# Patient Record
Sex: Male | Born: 1990 | Race: White | Hispanic: No | Marital: Single | Smoking: Current every day smoker
Health system: Southern US, Community
[De-identification: ages and names within clinical notes are randomized; demographics above are authoritative.]

## PROBLEM LIST (undated history)

## (undated) DIAGNOSIS — F909 Attention-deficit hyperactivity disorder, unspecified type: Secondary | ICD-10-CM

## (undated) DIAGNOSIS — F329 Major depressive disorder, single episode, unspecified: Secondary | ICD-10-CM

## (undated) DIAGNOSIS — I10 Essential (primary) hypertension: Secondary | ICD-10-CM

## (undated) DIAGNOSIS — F411 Generalized anxiety disorder: Secondary | ICD-10-CM

## (undated) HISTORY — PX: COLONOSCOPY: SHX174

## (undated) HISTORY — DX: Major depressive disorder, single episode, unspecified: F32.9

## (undated) HISTORY — PX: WISDOM TOOTH EXTRACTION: SHX21

## (undated) HISTORY — DX: Attention-deficit hyperactivity disorder, unspecified type: F90.9

## (undated) HISTORY — DX: Generalized anxiety disorder: F41.1

---

## 2013-07-04 ENCOUNTER — Ambulatory Visit (INDEPENDENT_AMBULATORY_CARE_PROVIDER_SITE_OTHER): Payer: No Typology Code available for payment source | Admitting: Family Medicine

## 2013-07-04 ENCOUNTER — Encounter (INDEPENDENT_AMBULATORY_CARE_PROVIDER_SITE_OTHER): Payer: Self-pay

## 2013-07-04 ENCOUNTER — Encounter: Payer: Self-pay | Admitting: Family Medicine

## 2013-07-04 ENCOUNTER — Telehealth: Payer: Self-pay | Admitting: Family Medicine

## 2013-07-04 VITALS — BP 129/82 | HR 82 | Temp 98.2°F | Ht 72.0 in | Wt 172.2 lb

## 2013-07-04 DIAGNOSIS — J209 Acute bronchitis, unspecified: Secondary | ICD-10-CM

## 2013-07-04 MED ORDER — AZITHROMYCIN 250 MG PO TABS: ORAL_TABLET | ORAL | Status: DC

## 2013-07-04 MED ORDER — METHYLPREDNISOLONE (PAK) 4 MG PO TABS: ORAL_TABLET | ORAL | Status: DC

## 2013-07-04 MED ORDER — BENZONATATE 200 MG PO CAPS: 200.0000 mg | ORAL_CAPSULE | Freq: Two times a day (BID) | ORAL | Status: DC | PRN

## 2013-07-04 NOTE — Progress Notes (Signed)
  Subjective:    Patient ID: Marcus Martinez, male    DOB: 12-29-1990, 22 y.o.   MRN: [id number]  HPI This 22 y.o. male presents for evaluation of URI sx's for over a week..   Review of Systems C/o URI sx's. No chest pain, SOB, HA, dizziness, vision change, N/V, diarrhea, constipation, dysuria, urinary urgency or frequency, myalgias, arthralgias or rash.     Objective:   Physical Exam Vital signs noted  Well developed well nourished male.  HEENT - Head atraumatic Normocephalic                Eyes - PERRLA, Conjuctiva - clear Sclera- Clear EOMI                Ears - EAC's Wnl TM's Wnl Gross Hearing WNL                Nose - Nares patent                 Throat - oropharanx wnl Respiratory - Lungs CTA bilateral Cardiac - RRR S1 and S2 without murmur GI - Abdomen soft Nontender and bowel sounds active x 4 Extremities - No edema. Neuro - Grossly intact.       Assessment & Plan:  Acute bronchitis - Plan: azithromycin (ZITHROMAX) 250 MG tablet, methylPREDNIsolone (MEDROL DOSPACK) 4 MG tablet, benzonatate (TESSALON) 200 MG capsule Deatra Canter FNP

## 2013-07-04 NOTE — Telephone Encounter (Signed)
Pt needs to be seen for sinus infection appt scheduled

## 2013-07-04 NOTE — Patient Instructions (Signed)

## 2013-07-29 ENCOUNTER — Encounter: Payer: Self-pay | Admitting: General Practice

## 2013-07-29 ENCOUNTER — Ambulatory Visit (INDEPENDENT_AMBULATORY_CARE_PROVIDER_SITE_OTHER): Payer: No Typology Code available for payment source | Admitting: General Practice

## 2013-07-29 VITALS — BP 145/83 | HR 103 | Temp 101.1°F | Ht 72.0 in | Wt 175.5 lb

## 2013-07-29 DIAGNOSIS — J029 Acute pharyngitis, unspecified: Secondary | ICD-10-CM

## 2013-07-29 LAB — POCT INFLUENZA A/B
Influenza A, POC: NEGATIVE
Influenza B, POC: NEGATIVE

## 2013-07-29 LAB — POCT RAPID STREP A (OFFICE): Rapid Strep A Screen: POSITIVE — AB

## 2013-07-29 MED ORDER — AZITHROMYCIN 250 MG PO TABS: ORAL_TABLET | ORAL | Status: DC

## 2013-07-29 NOTE — Patient Instructions (Signed)
Strep Throat  Strep throat is an infection of the throat caused by a bacteria named Streptococcus pyogenes. Your caregiver may call the infection streptococcal "tonsillitis" or "pharyngitis" depending on whether there are signs of inflammation in the tonsils or back of the throat. Strep throat is most common in children aged 22 15 years during the cold months of the year, but it can occur in people of any age during any season. This infection is spread from person to person (contagious) through coughing, sneezing, or other close contact.  SYMPTOMS   · Fever or chills.  · Painful, swollen, red tonsils or throat.  · Pain or difficulty when swallowing.  · White or yellow spots on the tonsils or throat.  · Swollen, tender lymph nodes or "glands" of the neck or under the jaw.  · Red rash all over the body (rare).  DIAGNOSIS   Many different infections can cause the same symptoms. A test must be done to confirm the diagnosis so the right treatment can be given. A "rapid strep test" can help your caregiver make the diagnosis in a few minutes. If this test is not available, a light swab of the infected area can be used for a throat culture test. If a throat culture test is done, results are usually available in a day or two.  TREATMENT   Strep throat is treated with antibiotic medicine.  HOME CARE INSTRUCTIONS   · Gargle with 1 tsp of salt in 1 cup of warm water, 3 4 times per day or as needed for comfort.  · Family members who also have a sore throat or fever should be tested for strep throat and treated with antibiotics if they have the strep infection.  · Make sure everyone in your household washes their hands well.  · Do not share food, drinking cups, or personal items that could cause the infection to spread to others.  · You may need to eat a soft food diet until your sore throat gets better.  · Drink enough water and fluids to keep your urine clear or pale yellow. This will help prevent dehydration.  · Get plenty of  rest.  · Stay home from school, daycare, or work until you have been on antibiotics for 24 hours.  · Only take over-the-counter or prescription medicines for pain, discomfort, or fever as directed by your caregiver.  · If antibiotics are prescribed, take them as directed. Finish them even if you start to feel better.  SEEK MEDICAL CARE IF:   · The glands in your neck continue to enlarge.  · You develop a rash, cough, or earache.  · You cough up green, yellow-brown, or bloody sputum.  · You have pain or discomfort not controlled by medicines.  · Your problems seem to be getting worse rather than better.  SEEK IMMEDIATE MEDICAL CARE IF:   · You develop any new symptoms such as vomiting, severe headache, stiff or painful neck, chest pain, shortness of breath, or trouble swallowing.  · You develop severe throat pain, drooling, or changes in your voice.  · You develop swelling of the neck, or the skin on the neck becomes red and tender.  · You have a fever.  · You develop signs of dehydration, such as fatigue, dry mouth, and decreased urination.  · You become increasingly sleepy, or you cannot wake up completely.  Document Released: 08/18/2000 Document Revised: 08/07/2012 Document Reviewed: 10/20/2010  ExitCare® Patient Information ©2014 [employer].

## 2013-07-29 NOTE — Progress Notes (Signed)
  Subjective:    Patient ID: Marcus Martinez, male    DOB: 1991/07/12, 22 y.o.   MRN: [id number]  Sore Throat  This is a new problem. The current episode started in the past 7 days. The problem has been gradually worsening. Neither side of throat is experiencing more pain than the other. The maximum temperature recorded prior to his arrival was 101 - 101.9 F. The pain is at a severity of 8/10. Pertinent negatives include no congestion, coughing, diarrhea or shortness of breath.      Review of Systems  HENT: Negative for congestion.   Respiratory: Negative for cough and shortness of breath.   Gastrointestinal: Negative for diarrhea.       Objective:   Physical Exam     Results for orders placed in visit on 07/29/13  POCT RAPID STREP A (OFFICE)      Result Value Range   Rapid Strep A Screen Positive (*) Negative       Assessment & Plan:

## 2013-07-30 ENCOUNTER — Telehealth: Payer: Self-pay | Admitting: General Practice

## 2013-07-30 NOTE — Telephone Encounter (Signed)
Mae this is your patient 

## 2013-07-30 NOTE — Telephone Encounter (Signed)
Please inform that script is ready for pick up.  

## 2013-07-30 NOTE — Telephone Encounter (Signed)
Patient aware.

## 2014-01-27 ENCOUNTER — Ambulatory Visit (INDEPENDENT_AMBULATORY_CARE_PROVIDER_SITE_OTHER): Payer: No Typology Code available for payment source | Admitting: *Deleted

## 2014-01-27 DIAGNOSIS — Z5189 Encounter for other specified aftercare: Secondary | ICD-10-CM

## 2014-01-27 NOTE — Progress Notes (Addendum)
Patient comes in today through triage as a hospital follow up. He was seen at [facility] in [place], [place] 2 days ago for a laceration to dorsal right foot and contusion below right eye. Patient was making a peanut butter sandwich during the night and dropped the knife on his foot causing the laceration. He then slipped in the blood and hit his head on the kitchen counter resulting in the the contusion. No LOC. He was discharged from the ER with Clindamycin which he started yesterday. He has had 3 doses. The laceration was secured with Dermabond and covered with a bandaid.  Today there is erythema and mild swelling surrounding the laceration.  No warmth or drainage noted. He does complain of some pain.  Contusion under eye appears to be progressing normally.  Continue antibiotic as directed at ER. Follow up appt scheduled for 2 days. Marked area of redness with permanent marker.  Call our office if erythema extends beyond marked area or if there is an increase in pain, swelling, or drainage.  Keep area clean, dry, and covered. Pat dry. Ibuprofen for pain. May alternate with Tylenol if uncontrolled.  Patient stated understanding and agreement to plan.  Work note given to cover until f/u appointment since patient stands on his feet 10 hours a day.

## 2014-01-27 NOTE — Patient Instructions (Addendum)
Keep area clean, dry, and covered. Pat dry. If redness spreads outside of marked area call our office. If increase in pain, redness, or drainage occurs please call our office.  Continue antibiotic as instructed at ER.  Ibuprofen for pain. May alternate with Tylenol if necessary.  Keep f/u appt on 01/29/14.

## 2014-01-29 ENCOUNTER — Encounter: Payer: Self-pay | Admitting: *Deleted

## 2014-01-29 ENCOUNTER — Encounter: Payer: Self-pay | Admitting: Family Medicine

## 2014-01-29 ENCOUNTER — Ambulatory Visit (INDEPENDENT_AMBULATORY_CARE_PROVIDER_SITE_OTHER): Payer: BC Managed Care – PPO | Admitting: Family Medicine

## 2014-01-29 VITALS — BP 124/80 | HR 81 | Temp 97.8°F | Ht 72.0 in | Wt 170.0 lb

## 2014-01-29 DIAGNOSIS — S91339A Puncture wound without foreign body, unspecified foot, initial encounter: Secondary | ICD-10-CM

## 2014-01-29 DIAGNOSIS — S0510XA Contusion of eyeball and orbital tissues, unspecified eye, initial encounter: Secondary | ICD-10-CM

## 2014-01-29 DIAGNOSIS — S0511XA Contusion of eyeball and orbital tissues, right eye, initial encounter: Secondary | ICD-10-CM

## 2014-01-29 DIAGNOSIS — S91309A Unspecified open wound, unspecified foot, initial encounter: Secondary | ICD-10-CM

## 2014-01-29 NOTE — Progress Notes (Signed)
   Subjective:    Patient ID: Marcus Martinez, male    DOB: 06-Nov-1990, 23 y.o.   MRN: [id number]  HPI Patient here today for follow up on right foot wound. He dropped a knife on his foot early Monday morning while fixing a sandwich, he then slipped on the blood and fell and hit right eye. This eye now has some bruising. The patient is alert and doing well except for the soreness in his dorsal right foot. There was minimal drainage from the foot wound        There are no active problems to display for this patient.  Outpatient Encounter Prescriptions as of 01/29/2014  Medication Sig  . CLINDAMYCIN HCL PO Take 1 capsule by mouth 3 (three) times daily.    Review of Systems  Constitutional: Negative.   HENT: Negative.   Eyes: Negative.   Respiratory: Negative.   Cardiovascular: Negative.   Gastrointestinal: Negative.   Endocrine: Negative.   Genitourinary: Negative.   Musculoskeletal: Negative.   Skin: Positive for color change (bruising under right eye). Wound: right foot.  Allergic/Immunologic: Negative.   Neurological: Negative.   Hematological: Negative.   Psychiatric/Behavioral: Negative.        Objective:   Physical Exam  Vitals reviewed. Constitutional: He is oriented to person, place, and time. He appears well-developed and well-nourished. He appears distressed.  HENT:  Head: Normocephalic.  Contusion right superior orbit with bruising below right eye  Eyes: Conjunctivae and EOM are normal. Pupils are equal, round, and reactive to light. Right eye exhibits no discharge. Left eye exhibits no discharge. No scleral icterus.  The disc were sharp bilaterally with good EOM  Neck: Normal range of motion.  Musculoskeletal: He exhibits tenderness. He exhibits no edema.  Slight soreness and dorsal foot with flexion and extension. The erythema appears to be regressing. There was still slight rubor present. There was tenderness in the dorsal foot appear  Neurological: He is  alert and oriented to person, place, and time. He has normal reflexes.  Skin: Skin is warm. No rash noted. There is erythema.  Psychiatric: He has a normal mood and affect. His behavior is normal. Judgment and thought content normal.   BP 124/80  Pulse 81  Temp(Src) 97.8 F (36.6 C) (Oral)  Ht 6' (1.829 m)  Wt 170 lb (77.111 kg)  BMI 23.05 kg/m2        Assessment & Plan:  1. Penetrating foot wound  2. Contusion of right orbit Patient Instructions  Clean area with betadine and gauze Follow up Monday Stay out of work until after we see you on Monday Keep foot elevated when possible Continue and complete antibiotic   Nyra Capes MD

## 2014-01-29 NOTE — Patient Instructions (Addendum)
Clean area with betadine and gauze Follow up Monday Stay out of work until after we see you on Monday Keep foot elevated when possible Continue and complete antibiotic

## 2014-02-02 ENCOUNTER — Encounter: Payer: Self-pay | Admitting: *Deleted

## 2014-02-02 ENCOUNTER — Ambulatory Visit (INDEPENDENT_AMBULATORY_CARE_PROVIDER_SITE_OTHER): Payer: BC Managed Care – PPO | Admitting: Family Medicine

## 2014-02-02 ENCOUNTER — Encounter: Payer: Self-pay | Admitting: Family Medicine

## 2014-02-02 VITALS — BP 137/79 | HR 85 | Temp 97.4°F | Ht 72.0 in | Wt 174.0 lb

## 2014-02-02 DIAGNOSIS — S91309A Unspecified open wound, unspecified foot, initial encounter: Secondary | ICD-10-CM

## 2014-02-02 DIAGNOSIS — S91339A Puncture wound without foreign body, unspecified foot, initial encounter: Secondary | ICD-10-CM

## 2014-02-02 MED ORDER — CLINDAMYCIN HCL 300 MG PO CAPS: 300.0000 mg | ORAL_CAPSULE | Freq: Three times a day (TID) | ORAL | Status: DC

## 2014-02-02 NOTE — Progress Notes (Signed)
   Subjective:    Patient ID: Marcus Martinez, male    DOB: [birth year], 23 y.o.   MRN: [id number]  HPI Patient here today for 3 day follow up for wound to right foot. The patient comes in today with his mother. The wound does have some drainage. He has 3 more days worth of antibiotics. There is less redness.        There are no active problems to display for this patient.  Outpatient Encounter Prescriptions as of 02/02/2014  Medication Sig  . CLINDAMYCIN HCL PO Take 1 capsule by mouth 3 (three) times daily.    Review of Systems  Constitutional: Negative.   HENT: Negative.   Eyes: Negative.   Respiratory: Negative.   Cardiovascular: Negative.   Gastrointestinal: Negative.   Endocrine: Negative.   Genitourinary: Negative.   Musculoskeletal: Negative.   Skin: Positive for wound (cleaning it daily and now able to put some pressure on right foot).  Allergic/Immunologic: Negative.   Neurological: Negative.   Hematological: Negative.   Psychiatric/Behavioral: Negative.        Objective:   Physical Exam BP 137/79  Pulse 85  Temp(Src) 97.4 F (36.3 C) (Oral)  Ht 6' (1.829 m)  Wt 174 lb (78.926 kg)  BMI 23.59 kg/m2  The patient is alert and cooperative. There is much less swelling and redness. There is still some drainage. The wound was originally glued together.      Assessment & Plan:  1. Penetrating foot wound - clindamycin (CLEOCIN) 300 MG capsule; Take 1 capsule (300 mg total) by mouth 3 (three) times daily.  Dispense: 12 capsule; Refill: 0 Patient Instructions  Continue the wound cleansing with Betadine Continue to bear more weight at home Continue the antibiotic until rechecked in one week   Remain out of work until rechecked in one week  Nyra Capes MD

## 2014-02-02 NOTE — Patient Instructions (Signed)
Continue the wound cleansing with Betadine Continue to bear more weight at home Continue the antibiotic until rechecked in one week

## 2014-02-09 ENCOUNTER — Encounter: Payer: Self-pay | Admitting: Family Medicine

## 2014-02-09 ENCOUNTER — Ambulatory Visit (INDEPENDENT_AMBULATORY_CARE_PROVIDER_SITE_OTHER): Payer: BC Managed Care – PPO | Admitting: Family Medicine

## 2014-02-09 VITALS — BP 142/83 | HR 95 | Temp 97.6°F | Ht 72.0 in | Wt 176.0 lb

## 2014-02-09 DIAGNOSIS — S91339A Puncture wound without foreign body, unspecified foot, initial encounter: Secondary | ICD-10-CM

## 2014-02-09 DIAGNOSIS — S91309A Unspecified open wound, unspecified foot, initial encounter: Secondary | ICD-10-CM

## 2014-02-09 DIAGNOSIS — Z9889 Other specified postprocedural states: Secondary | ICD-10-CM

## 2014-02-09 MED ORDER — CLINDAMYCIN HCL 300 MG PO CAPS: 300.0000 mg | ORAL_CAPSULE | Freq: Three times a day (TID) | ORAL | Status: DC

## 2014-02-09 MED ORDER — SODIUM CHLORIDE 0.9 % EX SOLN: CUTANEOUS | Status: DC

## 2014-02-09 NOTE — Progress Notes (Addendum)
Subjective:    Patient ID: Marcus Martinez, male    DOB: Dec 26, 1990, 23 y.o.   MRN: [id number]  HPI Patient here today for 1 week follow up from penetrating foot wound. There may be a slight increase in redness in the wound. The patient has not been back to work.         There are no active problems to display for this patient.  Outpatient Encounter Prescriptions as of 02/09/2014  Medication Sig  . clindamycin (CLEOCIN) 300 MG capsule Take 1 capsule (300 mg total) by mouth 3 (three) times daily.  . [DISCONTINUED] CLINDAMYCIN HCL PO Take 1 capsule by mouth 3 (three) times daily.    Review of Systems  Constitutional: Negative.   HENT: Negative.   Eyes: Negative.   Respiratory: Negative.   Cardiovascular: Negative.   Gastrointestinal: Negative.   Endocrine: Negative.   Genitourinary: Negative.   Musculoskeletal: Negative.   Skin: Positive for color change (redness now) and wound.  Allergic/Immunologic: Negative.   Neurological: Negative.   Hematological: Negative.   Psychiatric/Behavioral: Negative.        Objective:   Physical Exam  Constitutional: He is oriented to person, place, and time. He appears well-developed and well-nourished.  HENT:  Head: Normocephalic.  Eyes: Conjunctivae and EOM are normal. Pupils are equal, round, and reactive to light. Right eye exhibits no discharge. Left eye exhibits no discharge. No scleral icterus.  Musculoskeletal: Normal range of motion. He exhibits no edema and no tenderness.  Neurological: He is alert and oriented to person, place, and time.  Skin: Skin is warm. No rash noted. There is erythema.  Psychiatric: He has a normal mood and affect. His behavior is normal. Judgment and thought content normal.   BP 142/83  Pulse 95  Temp(Src) 97.6 F (36.4 C) (Oral)  Ht 6' (1.829 m)  Wt 176 lb (79.833 kg)  BMI 23.86 kg/m2  There is still erythema on the dorsal right foot and a glue apparently is not holding the wound together and  there is drainage from beneath the glue that was applied. The wound was abbreviated today and was cleansed after debriding. The patient tolerated the debridement without problems. After debriding some of the dead tissue the wound was cleansed thoroughly with Betadine and a saline dressing was applied. The patient tolerated the procedure well.      Assessment & Plan:  1. Penetrating foot wound - clindamycin (CLEOCIN) 300 MG capsule; Take 1 capsule (300 mg total) by mouth 3 (three) times daily.  Dispense: 12 capsule; Refill: 0 - SODIUM CHLORIDE, EXTERNAL, ([facility] SALINE WOUND WASH) 0.9 % SOLN; Cleanse wound three times a day  Dispense: 30 mL; Refill: 0  2. S/P debridement  Patient Instructions  Puncture Wound A puncture wound is an injury that extends through all layers of the skin and into the tissue beneath the skin (subcutaneous tissue). Puncture wounds become infected easily because germs often enter the body and go beneath the skin during the injury. Having a deep wound with a small entrance point makes it difficult for your caregiver to adequately clean the wound. This is especially true if you have stepped on a nail and it has passed through a dirty shoe or other situations where the wound is obviously contaminated. CAUSES  Many puncture wounds involve glass, nails, splinters, fish hooks, or other objects that enter the skin (foreign bodies). A puncture wound may also be caused by a human bite or animal bite. DIAGNOSIS  A puncture wound  is usually diagnosed by your history and a physical exam. You may need to have an X-ray or an ultrasound to check for any foreign bodies still in the wound. TREATMENT   Your caregiver will clean the wound as thoroughly as possible. Depending on the location of the wound, a bandage (dressing) may be applied.  Your caregiver might prescribe antibiotic medicines.  You may need a follow-up visit to check on your wound. Follow all instructions as directed by your  caregiver. HOME CARE INSTRUCTIONS   Change your dressing once per day, or as directed by your caregiver. If the dressing sticks, it may be removed by soaking the area in water.  If your caregiver has given you follow-up instructions, it is very important that you return for a follow-up appointment. Not following up as directed could result in a chronic or permanent injury, pain, and disability.  Only take over-the-counter or prescription medicines for pain, discomfort, or fever as directed by your caregiver.  If you are given antibiotics, take them as directed. Finish them even if you start to feel better. You may need a tetanus shot if:  You cannot remember when you had your last tetanus shot.  You have never had a tetanus shot. If you got a tetanus shot, your arm may swell, get red, and feel warm to the touch. This is common and not a problem. If you need a tetanus shot and you choose not to have one, there is a rare chance of getting tetanus. Sickness from tetanus can be serious. You may need a rabies shot if an animal bite caused your puncture wound. SEEK MEDICAL CARE IF:   You have redness, swelling, or increasing pain in the wound.  You have red streaks going away from the wound.  You notice a bad smell coming from the wound or dressing.  You have yellowish-white fluid (pus) coming from the wound.  You are treated with an antibiotic for infection, but the infection is not getting better.  You notice something in the wound, such as rubber from your shoe, cloth, or another object.  You have a fever.  You have severe pain.  You have difficulty breathing.  You feel dizzy or faint.  You cannot stop vomiting.  You lose feeling, develop numbness, or cannot move a limb below the wound.  Your symptoms worsen. MAKE SURE YOU:  Understand these instructions.  Will watch your condition.  Will get help right away if you are not doing well or get worse. Document Released:  05/31/2005 Document Revised: 11/13/2011 Document Reviewed: 02/07/2011 [facility] Patient Information 2014 [place] SummitExitCare, MarylandLLC.  Cleanse wound 2- 3 times daily with Betadine or peroxide, after cleansing apply saline compress Continue antibiotic We will call you with the culture report as soon as that is available   Nyra Capeson W. Moore MD  The patient should remain out of work for a longer period of time until this wound is healing more appropriately.  Nyra Capeson W. Moore MD

## 2014-02-09 NOTE — Addendum Note (Signed)
Addended by: Fawn Kirk on: 02/09/2014 03:18 PM   Modules accepted: Orders

## 2014-02-09 NOTE — Patient Instructions (Addendum)
Puncture Wound A puncture wound is an injury that extends through all layers of the skin and into the tissue beneath the skin (subcutaneous tissue). Puncture wounds become infected easily because germs often enter the body and go beneath the skin during the injury. Having a deep wound with a small entrance point makes it difficult for your caregiver to adequately clean the wound. This is especially true if you have stepped on a nail and it has passed through a dirty shoe or other situations where the wound is obviously contaminated. CAUSES  Many puncture wounds involve glass, nails, splinters, fish hooks, or other objects that enter the skin (foreign bodies). A puncture wound may also be caused by a human bite or animal bite. DIAGNOSIS  A puncture wound is usually diagnosed by your history and a physical exam. You may need to have an X-ray or an ultrasound to check for any foreign bodies still in the wound. TREATMENT   Your caregiver will clean the wound as thoroughly as possible. Depending on the location of the wound, a bandage (dressing) may be applied.  Your caregiver might prescribe antibiotic medicines.  You may need a follow-up visit to check on your wound. Follow all instructions as directed by your caregiver. HOME CARE INSTRUCTIONS   Change your dressing once per day, or as directed by your caregiver. If the dressing sticks, it may be removed by soaking the area in water.  If your caregiver has given you follow-up instructions, it is very important that you return for a follow-up appointment. Not following up as directed could result in a chronic or permanent injury, pain, and disability.  Only take over-the-counter or prescription medicines for pain, discomfort, or fever as directed by your caregiver.  If you are given antibiotics, take them as directed. Finish them even if you start to feel better. You may need a tetanus shot if:  You cannot remember when you had your last tetanus  shot.  You have never had a tetanus shot. If you got a tetanus shot, your arm may swell, get red, and feel warm to the touch. This is common and not a problem. If you need a tetanus shot and you choose not to have one, there is a rare chance of getting tetanus. Sickness from tetanus can be serious. You may need a rabies shot if an animal bite caused your puncture wound. SEEK MEDICAL CARE IF:   You have redness, swelling, or increasing pain in the wound.  You have red streaks going away from the wound.  You notice a bad smell coming from the wound or dressing.  You have yellowish-white fluid (pus) coming from the wound.  You are treated with an antibiotic for infection, but the infection is not getting better.  You notice something in the wound, such as rubber from your shoe, cloth, or another object.  You have a fever.  You have severe pain.  You have difficulty breathing.  You feel dizzy or faint.  You cannot stop vomiting.  You lose feeling, develop numbness, or cannot move a limb below the wound.  Your symptoms worsen. MAKE SURE YOU:  Understand these instructions.  Will watch your condition.  Will get help right away if you are not doing well or get worse. Document Released: 05/31/2005 Document Revised: 11/13/2011 Document Reviewed: 02/07/2011 [facility]ExitCare Patient Information 2014 [place] AntoniaExitCare, MarylandLLC.  Cleanse wound 2- 3 times daily with Betadine or peroxide, after cleansing apply saline compress Continue antibiotic We will call you with  the culture report as soon as that is available

## 2014-02-11 ENCOUNTER — Other Ambulatory Visit: Payer: Self-pay | Admitting: *Deleted

## 2014-02-11 LAB — AEROBIC CULTURE

## 2014-02-11 MED ORDER — SULFAMETHOXAZOLE-TMP DS 800-160 MG PO TABS: 1.0000 | ORAL_TABLET | Freq: Two times a day (BID) | ORAL | Status: DC

## 2014-02-12 ENCOUNTER — Ambulatory Visit (INDEPENDENT_AMBULATORY_CARE_PROVIDER_SITE_OTHER): Payer: BC Managed Care – PPO | Admitting: Family Medicine

## 2014-02-12 ENCOUNTER — Encounter: Payer: Self-pay | Admitting: Family Medicine

## 2014-02-12 VITALS — BP 122/80 | HR 73 | Temp 98.8°F | Ht 72.0 in | Wt 172.0 lb

## 2014-02-12 DIAGNOSIS — S91309A Unspecified open wound, unspecified foot, initial encounter: Secondary | ICD-10-CM

## 2014-02-12 DIAGNOSIS — S91339A Puncture wound without foreign body, unspecified foot, initial encounter: Secondary | ICD-10-CM

## 2014-02-12 NOTE — Progress Notes (Signed)
   Subjective:    Patient ID: Marcus Martinez, male    DOB: 12-Oct-1990, 23 y.o.   MRN: [id number]  HPI Patient returns today to follow up on laceration to dorsal right foot. He reports that the area is improving. He is tolerating his antibiotics well. The puncture wound is finally healing some. A recent culture and sensitivity was done and the bacteria were sensitive to the mycin drugs but because he's been on them for so long we switched him to sulfa and he is tolerating this well so far.   Review of Systems  Skin: Positive for wound (dorsal right foot).  All other systems reviewed and are negative.      Objective:   Physical Exam  BP 122/80  Pulse 73  Temp(Src) 98.8 F (37.1 C) (Oral)  Ht 6' (1.829 m)  Wt 172 lb (78.019 kg)  BMI 23.32 kg/m2 The wound looks much improved today it was cleaned gently with Q-tips saturated with peroxide and he is doing the same as. He tolerated the procedure well. A dressing was applied before he left the office.      Assessment & Plan:  Penetrating foot wound, improving  Patient Instructions  Continue to clean the wound 3 or 4 times a day with peroxide and apply a saline dressing when outside of the home. Keep follow up appt on 02/17/14. Continue to stay out of work until released. Report any signs of infection.    Nyra Capes MD

## 2014-02-12 NOTE — Patient Instructions (Addendum)
Continue to clean the wound 3 or 4 times a day with peroxide and apply a saline dressing when outside of the home. Keep follow up appt on 02/17/14. Continue to stay out of work until released. Report any signs of infection.

## 2014-02-17 ENCOUNTER — Encounter: Payer: Self-pay | Admitting: Family Medicine

## 2014-02-17 ENCOUNTER — Ambulatory Visit (INDEPENDENT_AMBULATORY_CARE_PROVIDER_SITE_OTHER): Payer: BC Managed Care – PPO | Admitting: Family Medicine

## 2014-02-17 ENCOUNTER — Telehealth: Payer: Self-pay | Admitting: Family Medicine

## 2014-02-17 ENCOUNTER — Encounter: Payer: Self-pay | Admitting: *Deleted

## 2014-02-17 VITALS — BP 115/73 | HR 71 | Temp 99.1°F | Ht 72.0 in | Wt 172.0 lb

## 2014-02-17 DIAGNOSIS — S91339A Puncture wound without foreign body, unspecified foot, initial encounter: Secondary | ICD-10-CM

## 2014-02-17 DIAGNOSIS — S91309A Unspecified open wound, unspecified foot, initial encounter: Secondary | ICD-10-CM

## 2014-02-17 NOTE — Patient Instructions (Signed)
Continue to clean area daily Follow up in 1-2 weeks if needed. Complete antibiotic Return to work on Monday.

## 2014-02-17 NOTE — Progress Notes (Signed)
   Subjective:    Patient ID: Marcus Martinez, male    DOB: 08-15-1991, 23 y.o.   MRN: [id number]  HPI Patient here today for 1 week follow up from right foot laceration. Area is clean, dry and covered. There is no redness or drainage around the laceration.    There are no active problems to display for this patient.  Outpatient Encounter Prescriptions as of 02/17/2014  Medication Sig  . SODIUM CHLORIDE, EXTERNAL, ([facility] SALINE WOUND WASH) 0.9 % SOLN Cleanse wound three times a day  . sulfamethoxazole-trimethoprim (BACTRIM DS) 800-160 MG per tablet Take 1 tablet by mouth 2 (two) times daily.    Review of Systems  Constitutional: Negative.   HENT: Negative.   Eyes: Negative.   Respiratory: Negative.   Cardiovascular: Negative.   Gastrointestinal: Negative.   Endocrine: Negative.   Genitourinary: Negative.   Musculoskeletal: Negative.   Skin: Wound: healing right foot laceration.  Allergic/Immunologic: Negative.   Neurological: Negative.   Hematological: Negative.   Psychiatric/Behavioral: Negative.        Objective:   Physical Exam BP 115/73  Pulse 71  Temp(Src) 99.1 F (37.3 C) (Oral)  Ht 6' (1.829 m)  Wt 172 lb (78.019 kg)  BMI 23.32 kg/m2  The wound on the dorsal foot continues to improve. There is much less redness and there is minimal drainage. The wound today was cleanse with peroxide and a new dressing was placed. The patient is instructed to continue wound cleansing.       Assessment & Plan:   1. Penetrating foot wound -Much improved  Patient Instructions  Continue to clean area daily Follow up in 1-2 weeks if needed. Complete antibiotic Return to work on Monday.    Nyra Capeson W. Moore MD

## 2014-02-18 ENCOUNTER — Telehealth: Payer: Self-pay | Admitting: Family Medicine

## 2014-02-18 NOTE — Telephone Encounter (Signed)
Patient aware wants to see if doctor moore nurse faxed over paper work for insurance?

## 2014-02-20 ENCOUNTER — Ambulatory Visit: Payer: BC Managed Care – PPO | Admitting: Family Medicine

## 2014-02-20 NOTE — Telephone Encounter (Signed)
Pt aware that forms are being worked on today

## 2014-02-24 ENCOUNTER — Telehealth: Payer: Self-pay | Admitting: Family Medicine

## 2014-02-24 NOTE — Telephone Encounter (Signed)
appt given for tomorrow with bill webster

## 2014-02-24 NOTE — Telephone Encounter (Signed)
Done by Carren Rangonna Lawson, pt aware

## 2014-02-25 ENCOUNTER — Encounter: Payer: Self-pay | Admitting: Physician Assistant

## 2014-02-25 ENCOUNTER — Ambulatory Visit (INDEPENDENT_AMBULATORY_CARE_PROVIDER_SITE_OTHER): Payer: BC Managed Care – PPO | Admitting: Physician Assistant

## 2014-02-25 VITALS — BP 118/82 | Temp 97.6°F | Ht 72.0 in | Wt 172.6 lb

## 2014-02-25 DIAGNOSIS — S99921S Unspecified injury of right foot, sequela: Secondary | ICD-10-CM

## 2014-02-25 DIAGNOSIS — IMO0001 Reserved for inherently not codable concepts without codable children: Secondary | ICD-10-CM

## 2014-02-25 NOTE — Patient Instructions (Signed)

## 2014-02-25 NOTE — Progress Notes (Signed)
Subjective:     Patient ID: Marcus Martinez, male   DOB: March 28, 1991, 23 y.o.   MRN: [id number]  HPI Pt here for review of R foot injury  Review of Systems Sl redness around the wound Denies any drainage or increase pain to site Sl swelling after wearing required shoes for work    Objective:   Physical Exam Healing wound to the superior R foot No area of fluct Granulating in well No drainage No TTP FROM of the foot    Assessment:     R foot injury    Plan:     Reviewed with pt the redness he is seeing is were the wound is granulating/healing Activities as tolerated S&S of infection reviewed F/U prn

## 2014-06-17 ENCOUNTER — Encounter: Payer: Self-pay | Admitting: Family

## 2014-06-17 ENCOUNTER — Ambulatory Visit: Payer: BC Managed Care – PPO | Admitting: Family

## 2014-06-17 VITALS — BP 152/100 | HR 98 | Temp 100.4°F | Ht 72.0 in | Wt 180.8 lb

## 2014-06-17 DIAGNOSIS — J029 Acute pharyngitis, unspecified: Secondary | ICD-10-CM

## 2014-06-17 LAB — POCT INFLUENZA A/B
Influenza A, POC: NEGATIVE
Influenza B, POC: NEGATIVE

## 2014-06-17 LAB — POCT RAPID STREP A (OFFICE): Rapid Strep A Screen: NEGATIVE

## 2014-06-17 MED ORDER — AZITHROMYCIN 250 MG PO TABS: ORAL_TABLET | ORAL | Status: DC

## 2014-06-17 NOTE — Progress Notes (Signed)
Subjective:    Patient ID: Marcus Martinez, male    DOB: 01-08-1991, 23 y.o.   MRN: [id number]  Fever  Associated symptoms include congestion and headaches. Pertinent negatives include no coughing, diarrhea, ear pain or vomiting.  Sore Throat  This is a new problem. The current episode started yesterday. The problem has been unchanged. Neither side of throat is experiencing more pain than the other. The maximum temperature recorded prior to his arrival was 101 - 101.9 F. The fever has been present for less than 1 day. The pain is at a severity of 7/10. The pain is moderate. Associated symptoms include congestion, headaches, a hoarse voice, a plugged ear sensation and trouble swallowing. Pertinent negatives include no coughing, diarrhea, ear pain, shortness of breath or vomiting. He has had no exposure to strep. He has tried NSAIDs (Flu and sinus, and mucinex) for the symptoms. The treatment provided mild relief.      Review of Systems  Constitutional: Positive for fever.  HENT: Positive for congestion, hoarse voice and trouble swallowing. Negative for ear pain.   Respiratory: Negative.  Negative for cough and shortness of breath.   Cardiovascular: Negative.   Gastrointestinal: Negative.  Negative for vomiting and diarrhea.  Endocrine: Negative.   Genitourinary: Negative.   Musculoskeletal: Negative.   Neurological: Positive for headaches.  Hematological: Negative.   Psychiatric/Behavioral: Negative.   All other systems reviewed and are negative.      Objective:   Physical Exam  Vitals reviewed. Constitutional: He is oriented to person, place, and time. He appears well-developed and well-nourished. No distress.  HENT:  Head: Normocephalic.  Right Ear: External ear normal.  Left Ear: External ear normal.  Mouth/Throat: Oropharyngeal exudate present. Tonsillar abscesses: Tonsills swollen and erythemas.  Nasal passage erythemas   Eyes: Pupils are equal, round, and reactive to  light. Right eye exhibits no discharge. Left eye exhibits no discharge.  Neck: Normal range of motion. Neck supple. No thyromegaly present.  Cardiovascular: Normal rate, regular rhythm, normal heart sounds and intact distal pulses.   No murmur heard. Pulmonary/Chest: Effort normal and breath sounds normal. No respiratory distress. He has no wheezes.  Abdominal: Soft. Bowel sounds are normal. He exhibits no distension. There is no tenderness.  Musculoskeletal: Normal range of motion. He exhibits no edema and no tenderness.  Neurological: He is alert and oriented to person, place, and time. He has normal reflexes. No cranial nerve deficit.  Skin: Skin is warm and dry. No rash noted. No erythema.  Psychiatric: He has a normal mood and affect. His behavior is normal. Judgment and thought content normal.      BP 152/100  Pulse 98  Temp(Src) 100.4 F (38 C) (Oral)  Ht 6' (1.829 m)  Wt 180 lb 12.8 oz (82.01 kg)  BMI 24.52 kg/m2     Assessment & Plan:  1. Sore throat - POCT Influenza A/B - POCT rapid strep A  2. Acute pharyngitis, unspecified pharyngitis type -- Take meds as prescribed - Use a cool mist humidifier  -Use saline nose sprays frequently -Saline irrigations of the nose can be very helpful if done frequently.  * 4X daily for 1 week*  * Use of a nettie pot can be helpful with this. Follow directions with this* -Force fluids -For any cough or congestion  Use plain Mucinex- regular strength or max strength is fine   * Children- consult with Pharmacist for dosing -For fever or aces or pains- take tylenol or ibuprofen appropriate for age  and weight.  * for fevers greater than 101 orally you may alternate ibuprofen and tylenol every  3 hours. -Throat lozenges if help -New toothbrush in 3 days - azithromycin (ZITHROMAX) 250 MG tablet; Take 500mg  today, then 250 mg for 4 days  Dispense: 6 tablet; Refill: 0  Jannifer Rodneyhristy Hawks, FNP

## 2014-06-17 NOTE — Patient Instructions (Addendum)
Strep Throat Strep throat is an infection of the throat caused by a bacteria named Streptococcus pyogenes. Your health care provider may call the infection streptococcal "tonsillitis" or "pharyngitis" depending on whether there are signs of inflammation in the tonsils or back of the throat. Strep throat is most common in children aged 23-15 years during the cold months of the year, but it can occur in people of any age during any season. This infection is spread from person to person (contagious) through coughing, sneezing, or other close contact. SIGNS AND SYMPTOMS   Fever or chills.  Painful, swollen, red tonsils or throat.  Pain or difficulty when swallowing.  White or yellow spots on the tonsils or throat.  Swollen, tender lymph nodes or "glands" of the neck or under the jaw.  Red rash all over the body (rare). DIAGNOSIS  Many different infections can cause the same symptoms. A test must be done to confirm the diagnosis so the right treatment can be given. A "rapid strep test" can help your health care provider make the diagnosis in a few minutes. If this test is not available, a light swab of the infected area can be used for a throat culture test. If a throat culture test is done, results are usually available in a day or two. TREATMENT  Strep throat is treated with antibiotic medicine. HOME CARE INSTRUCTIONS   Gargle with 1 tsp of salt in 1 cup of warm water, 3-4 times per day or as needed for comfort.  Family members who also have a sore throat or fever should be tested for strep throat and treated with antibiotics if they have the strep infection.  Make sure everyone in your household washes their hands well.  Do not share food, drinking cups, or personal items that could cause the infection to spread to others.  You may need to eat a soft food diet until your sore throat gets better.  Drink enough water and fluids to keep your urine clear or pale yellow. This will help prevent  dehydration.  Get plenty of rest.  Stay home from school, day care, or work until you have been on antibiotics for 24 hours.  Take medicines only as directed by your health care provider.  Take your antibiotic medicine as directed by your health care provider. Finish it even if you start to feel better. SEEK MEDICAL CARE IF:   The glands in your neck continue to enlarge.  You develop a rash, cough, or earache.  You cough up green, yellow-brown, or bloody sputum.  You have pain or discomfort not controlled by medicines.  Your problems seem to be getting worse rather than better.  You have a fever. SEEK IMMEDIATE MEDICAL CARE IF:   You develop any new symptoms such as vomiting, severe headache, stiff or painful neck, chest pain, shortness of breath, or trouble swallowing.  You develop severe throat pain, drooling, or changes in your voice.  You develop swelling of the neck, or the skin on the neck becomes red and tender.  You develop signs of dehydration, such as fatigue, dry mouth, and decreased urination.  You become increasingly sleepy, or you cannot wake up completely. MAKE SURE YOU:  Understand these instructions.  Will watch your condition.  Will get help right away if you are not doing well or get worse. Document Released: 08/18/2000 Document Revised: 01/05/2014 Document Reviewed: 10/20/2010 [facility] Patient Information 2015 [place], [place]. This information is not intended to replace advice given to you by  your health care provider. Make sure you discuss any questions you have with your health care provider.  - Take meds as prescribed - Use a cool mist humidifier  -Use saline nose sprays frequently -Saline irrigations of the nose can be very helpful if done frequently.  * 4X daily for 1 week*  * Use of a nettie pot can be helpful with this. Follow directions with this* -Force fluids -For any cough or congestion  Use plain Mucinex- regular strength or max  strength is fine   * Children- consult with Pharmacist for dosing -For fever or aces or pains- take tylenol or ibuprofen appropriate for age and weight.  * for fevers greater than 101 orally you may alternate ibuprofen and tylenol every  3 hours. -Throat lozenges if help -New toothbrush in 3 days   Olajuwon Fosdick, FNP  

## 2015-06-10 ENCOUNTER — Telehealth: Payer: Self-pay | Admitting: Family Medicine

## 2015-07-02 ENCOUNTER — Encounter: Payer: Self-pay | Admitting: Family Medicine

## 2015-09-03 ENCOUNTER — Ambulatory Visit: Payer: Self-pay | Admitting: Family Medicine

## 2015-11-15 ENCOUNTER — Ambulatory Visit (INDEPENDENT_AMBULATORY_CARE_PROVIDER_SITE_OTHER): Payer: BLUE CROSS/BLUE SHIELD | Admitting: Family Medicine

## 2015-11-15 ENCOUNTER — Encounter: Payer: Self-pay | Admitting: Family Medicine

## 2015-11-15 VITALS — BP 137/83 | HR 97 | Temp 98.4°F | Ht 72.0 in | Wt 199.5 lb

## 2015-11-15 DIAGNOSIS — R05 Cough: Secondary | ICD-10-CM

## 2015-11-15 DIAGNOSIS — J029 Acute pharyngitis, unspecified: Secondary | ICD-10-CM | POA: Diagnosis not present

## 2015-11-15 DIAGNOSIS — R059 Cough, unspecified: Secondary | ICD-10-CM

## 2015-11-15 DIAGNOSIS — J351 Hypertrophy of tonsils: Secondary | ICD-10-CM | POA: Diagnosis not present

## 2015-11-15 DIAGNOSIS — J302 Other seasonal allergic rhinitis: Secondary | ICD-10-CM | POA: Diagnosis not present

## 2015-11-15 LAB — VERITOR FLU A/B WAIVED
Influenza A: NEGATIVE
Influenza B: NEGATIVE

## 2015-11-15 LAB — RAPID STREP SCREEN (MED CTR MEBANE ONLY): Strep Gp A Ag, IA W/Reflex: NEGATIVE

## 2015-11-15 LAB — CULTURE, GROUP A STREP

## 2015-11-15 MED ORDER — BETAMETHASONE SOD PHOS & ACET 6 (3-3) MG/ML IJ SUSP
6.0000 mg | Freq: Once | INTRAMUSCULAR | Status: AC
  Administered 2015-11-15: 6 mg via INTRAMUSCULAR

## 2015-11-15 MED ORDER — MONTELUKAST SODIUM 10 MG PO TABS: 10.0000 mg | ORAL_TABLET | Freq: Every day | ORAL | Status: DC

## 2015-11-15 NOTE — Progress Notes (Signed)
Subjective:  Patient ID: Marcus Martinez, male    DOB: 03-24-1991  Age: 25 y.o. MRN: [id number]  CC: Nasal Congestion   HPI Dinnis Rog presents for Patient presents with upper respiratory congestion. Rhinorrhea that is frequently clear, occasionally purulent. There is moderate sore throat. Patient reports coughing frequently as well. Yellow-colored/purulent sputum noted. There is no fever no chills no sweats. The patient denies being short of breath. Onset was 4 days ago. Gradually worsening.   History Everado has a past medical history of ADHD (attention deficit hyperactivity disorder).   He has no past surgical history on file.   His family history is not on file.He reports that he has never smoked. He has never used smokeless tobacco. He reports that he does not drink alcohol or use illicit drugs.    ROS Review of Systems  Constitutional: Negative for fever, chills, activity change and appetite change.  HENT: Positive for congestion, postnasal drip and rhinorrhea. Negative for ear discharge, ear pain, hearing loss, nosebleeds, sinus pressure, sneezing and trouble swallowing.   Respiratory: Negative for chest tightness and shortness of breath.   Cardiovascular: Negative for chest pain and palpitations.  Skin: Negative for rash.    Objective:  BP 137/83 mmHg  Pulse 97  Temp(Src) 98.4 F (36.9 C) (Oral)  Ht 6' (1.829 m)  Wt 199 lb 8 oz (90.493 kg)  BMI 27.05 kg/m2  BP Readings from Last 3 Encounters:  11/15/15 137/83  06/17/14 152/100  02/25/14 118/82    Wt Readings from Last 3 Encounters:  11/15/15 199 lb 8 oz (90.493 kg)  06/17/14 180 lb 12.8 oz (82.01 kg)  02/25/14 172 lb 9.6 oz (78.291 kg)     Physical Exam  Constitutional: He appears well-developed and well-nourished.  HENT:  Head: Normocephalic and atraumatic.  Right Ear: Tympanic membrane and external ear normal. No decreased hearing is noted.  Left Ear: Tympanic membrane and external ear normal.  No decreased hearing is noted.  Nose: Mucosal edema present. Right sinus exhibits no frontal sinus tenderness. Left sinus exhibits no frontal sinus tenderness.  Mouth/Throat: Mucous membranes are normal. No oropharyngeal exudate, posterior oropharyngeal erythema or tonsillar abscesses.  Tonsils 4+ enlarged   Neck: No Brudzinski's sign noted.  Pulmonary/Chest: Breath sounds normal. No respiratory distress.  Lymphadenopathy:       Head (right side): No preauricular adenopathy present.       Head (left side): No preauricular adenopathy present.       Right cervical: No superficial cervical adenopathy present.      Left cervical: No superficial cervical adenopathy present.     No results found for: WBC, HGB, HCT, PLT, GLUCOSE, CHOL, TRIG, HDL, LDLDIRECT, LDLCALC, ALT, AST, NA, K, CL, CREATININE, BUN, CO2, TSH, PSA, INR, GLUF, HGBA1C, MICROALBUR  Patient was never admitted.  Assessment & Plan:   Slyvester was seen today for nasal congestion.  Diagnoses and all orders for this visit:  Sore throat -     Rapid strep screen (not at [facility])  Cough -     Veritor Flu A/B Waived  Other seasonal allergic rhinitis -     betamethasone acetate-betamethasone sodium phosphate (CELESTONE) injection 6 mg; Inject 1 mL (6 mg total) into the muscle once.  Tonsillar hypertrophy  Other orders -     montelukast (SINGULAIR) 10 MG tablet; Take 1 tablet (10 mg total) by mouth at bedtime.   I have discontinued Mr. Mckinney's azithromycin. I am also having him start on montelukast. We will continue to  administer betamethasone acetate-betamethasone sodium phosphate.  Meds ordered this encounter  Medications  . montelukast (SINGULAIR) 10 MG tablet    Sig: Take 1 tablet (10 mg total) by mouth at bedtime.    Dispense:  30 tablet    Refill:  5  . betamethasone acetate-betamethasone sodium phosphate (CELESTONE) injection 6 mg    Sig:      Follow-up: Return if symptoms worsen or fail to improve.  Mechele ClaudeWarren  Samiyah Stupka, M.D.

## 2016-01-04 ENCOUNTER — Other Ambulatory Visit: Payer: Self-pay | Admitting: *Deleted

## 2016-01-04 MED ORDER — MONTELUKAST SODIUM 10 MG PO TABS: 10.0000 mg | ORAL_TABLET | Freq: Every day | ORAL | Status: DC

## 2016-06-20 ENCOUNTER — Ambulatory Visit (INDEPENDENT_AMBULATORY_CARE_PROVIDER_SITE_OTHER): Payer: BLUE CROSS/BLUE SHIELD | Admitting: Family

## 2016-06-20 ENCOUNTER — Encounter: Payer: Self-pay | Admitting: Family

## 2016-06-20 VITALS — BP 130/81 | HR 88 | Temp 97.6°F | Ht 72.0 in | Wt 197.8 lb

## 2016-06-20 DIAGNOSIS — F321 Major depressive disorder, single episode, moderate: Secondary | ICD-10-CM | POA: Diagnosis not present

## 2016-06-20 DIAGNOSIS — R4589 Other symptoms and signs involving emotional state: Secondary | ICD-10-CM

## 2016-06-20 NOTE — Progress Notes (Signed)
   Subjective:    Patient ID: Marcus Martinez, male    DOB: 05-14-1991, 25 y.o.   MRN: [id number]  Pt states he put his dog down yesterday and can not stop crying and not sleep. PT states he got his dog right when he lost his dad from cancer 14 years ago. PT states this was the last thing he had from his dad.  Depression       The patient presents with depression.  This is a new problem.  The current episode started in the past 7 days.   The onset quality is gradual.   The problem occurs constantly.The problem is unchanged.  Associated symptoms include decreased concentration, irritable, restlessness and sad.  Associated symptoms include no hopelessness and no suicidal ideas.  Past treatments include nothing.  Compliance with treatment is good.  Past medical history includes depression.       Review of Systems  Psychiatric/Behavioral: Positive for decreased concentration and depression. Negative for suicidal ideas. The patient is nervous/anxious.   All other systems reviewed and are negative.      Objective:   Physical Exam  Constitutional: He is oriented to person, place, and time. He appears well-developed and well-nourished. He is irritable. No distress.  HENT:  Head: Normocephalic.  Eyes: Pupils are equal, round, and reactive to light. Right eye exhibits no discharge. Left eye exhibits no discharge.  Neck: Normal range of motion. Neck supple. No thyromegaly present.  Cardiovascular: Normal rate, regular rhythm, normal heart sounds and intact distal pulses.   No murmur heard. Pulmonary/Chest: Effort normal and breath sounds normal. No respiratory distress. He has no wheezes.  Abdominal: Soft. Bowel sounds are normal. He exhibits no distension. There is no tenderness.  Musculoskeletal: Normal range of motion. He exhibits no edema or tenderness.  Neurological: He is alert and oriented to person, place, and time. He has normal reflexes. No cranial nerve deficit.  Skin: Skin is warm and  dry. No rash noted. No erythema.  Psychiatric: His behavior is normal. Judgment and thought content normal. His mood appears anxious. He exhibits a depressed mood.  PT very tearful  Vitals reviewed.     BP 130/81   Pulse 88   Temp 97.6 F (36.4 C) (Oral)   Ht 6' (1.829 m)   Wt 197 lb 12.8 oz (89.7 kg)   BMI 26.83 kg/m      Assessment & Plan:  1. Moderate single current episode of major depressive disorder (HCC)  2. Feeling sad  Discussed these feeling of grieving are normal. Too early to start medication.  Balance diet Encourage exercise Relaxation techniques  Music therapy  Discussed maybe pt will get another dog soon!  Jannifer Rodneyhristy Hawks, FNP

## 2016-06-20 NOTE — Patient Instructions (Signed)
Stress and Stress Management Stress is a normal reaction to life events. It is what you feel when life demands more than you are used to or more than you can handle. Some stress can be useful. For example, the stress reaction can help you catch the last bus of the day, study for a test, or meet a deadline at work. But stress that occurs too often or for too long can cause problems. It can affect your emotional health and interfere with relationships and normal daily activities. Too much stress can weaken your immune system and increase your risk for physical illness. If you already have a medical problem, stress can make it worse. CAUSES  All sorts of life events may cause stress. An event that causes stress for one person may not be stressful for another person. Major life events commonly cause stress. These may be positive or negative. Examples include losing your job, moving into a new home, getting married, having a baby, or losing a loved one. Less obvious life events may also cause stress, especially if they occur day after day or in combination. Examples include working long hours, driving in traffic, caring for children, being in debt, or being in a difficult relationship. SIGNS AND SYMPTOMS Stress may cause emotional symptoms including, the following:  Anxiety. This is feeling worried, afraid, on edge, overwhelmed, or out of control.  Anger. This is feeling irritated or impatient.  Depression. This is feeling sad, down, helpless, or guilty.  Difficulty focusing, remembering, or making decisions. Stress may cause physical symptoms, including the following:   Aches and pains. These may affect your head, neck, back, stomach, or other areas of your body.  Tight muscles or clenched jaw.  Low energy or trouble sleeping. Stress may cause unhealthy behaviors, including the following:   Eating to feel better (overeating) or skipping meals.  Sleeping too little, too much, or both.  Working  too much or putting off tasks (procrastination).  Smoking, drinking alcohol, or using drugs to feel better. DIAGNOSIS  Stress is diagnosed through an assessment by your health care provider. Your health care provider will ask questions about your symptoms and any stressful life events.Your health care provider will also ask about your medical history and may order blood tests or other tests. Certain medical conditions and medicine can cause physical symptoms similar to stress. Mental illness can cause emotional symptoms and unhealthy behaviors similar to stress. Your health care provider may refer you to a mental health professional for further evaluation.  TREATMENT  Stress management is the recommended treatment for stress.The goals of stress management are reducing stressful life events and coping with stress in healthy ways.  Techniques for reducing stressful life events include the following:  Stress identification. Self-monitor for stress and identify what causes stress for you. These skills may help you to avoid some stressful events.  Time management. Set your priorities, keep a calendar of events, and learn to say "no." These tools can help you avoid making too many commitments. Techniques for coping with stress include the following:  Rethinking the problem. Try to think realistically about stressful events rather than ignoring them or overreacting. Try to find the positives in a stressful situation rather than focusing on the negatives.  Exercise. Physical exercise can release both physical and emotional tension. The key is to find a form of exercise you enjoy and do it regularly.  Relaxation techniques. These relax the body and mind. Examples include yoga, meditation, tai chi, biofeedback, deep  breathing, progressive muscle relaxation, listening to music, being out in nature, journaling, and other hobbies. Again, the key is to find one or more that you enjoy and can do  regularly.  Healthy lifestyle. Eat a balanced diet, get plenty of sleep, and do not smoke. Avoid using alcohol or drugs to relax.  Strong support network. Spend time with family, friends, or other people you enjoy being around.Express your feelings and talk things over with someone you trust. Counseling or talktherapy with a mental health professional may be helpful if you are having difficulty managing stress on your own. Medicine is typically not recommended for the treatment of stress.Talk to your health care provider if you think you need medicine for symptoms of stress. HOME CARE INSTRUCTIONS  Keep all follow-up visits as directed by your health care provider.  Take all medicines as directed by your health care provider. SEEK MEDICAL CARE IF:  Your symptoms get worse or you start having new symptoms.  You feel overwhelmed by your problems and can no longer manage them on your own. SEEK IMMEDIATE MEDICAL CARE IF:  You feel like hurting yourself or someone else.   This information is not intended to replace advice given to you by your health care provider. Make sure you discuss any questions you have with your health care provider.   Document Released: 02/14/2001 Document Revised: 09/11/2014 Document Reviewed: 04/15/2013 [employer] Interactive Patient Education 2016 [employer]  

## 2016-06-26 ENCOUNTER — Telehealth: Payer: Self-pay | Admitting: Family

## 2016-06-26 NOTE — Telephone Encounter (Signed)
NA / NVM. Need to know what addt days pt needs note written for

## 2016-06-26 NOTE — Telephone Encounter (Signed)
Pt needed the to add the day before his appt 10/16 the day he had to put his dog to sleep. Went ahead and up dated the note & faxed to [phone], pt's employer.

## 2016-09-06 ENCOUNTER — Ambulatory Visit (INDEPENDENT_AMBULATORY_CARE_PROVIDER_SITE_OTHER): Payer: BLUE CROSS/BLUE SHIELD | Admitting: Pediatrics

## 2016-09-06 ENCOUNTER — Encounter: Payer: Self-pay | Admitting: Pediatrics

## 2016-09-06 VITALS — BP 137/89 | HR 97 | Temp 97.0°F | Resp 20 | Ht 72.0 in | Wt 205.6 lb

## 2016-09-06 DIAGNOSIS — J3089 Other allergic rhinitis: Secondary | ICD-10-CM

## 2016-09-06 MED ORDER — FLUTICASONE PROPIONATE 50 MCG/ACT NA SUSP: 2.0000 | Freq: Every day | NASAL | 6 refills | Status: DC

## 2016-09-06 MED ORDER — CETIRIZINE HCL 10 MG PO TABS: 10.0000 mg | ORAL_TABLET | Freq: Every day | ORAL | 11 refills | Status: DC

## 2016-09-06 NOTE — Patient Instructions (Signed)
flonase every day Zyrtec every day neti pot rinses twice a day  NASAL STEROID USE INSTRUCTIONS  Step 1. Prepare the nose. Blow the nose before administering the drug.  Step 2. Prime and activate the delivery device as recommended by the manufacturer.  Step 3. Position the head by tilting the head forward.  Step 4. Insert the tip of the applicator gently, avoiding contact with the septum.  Step 5. Aim the applicator tip about 45 from the floor of the nose and direct it at the outer corner of the eye on the same side to avoid traumatizing or spraying the septum.  Step 6. Close the other nostril gently with a finger.   Step 7. Sniff or inhale gently while delivering the drug.

## 2016-09-06 NOTE — Progress Notes (Signed)
  Subjective:   Patient ID: Marcus Martinez, male    DOB: 1990-11-22, 26 y.o.   MRN: [id number] CC: Shortness of Breath (2 months)  HPI: Marcus Martinez is a 26 y.o. male presenting for Shortness of Breath (2 months)  Allergies: taking benadryl at night mucinex at night Snores loudly at times, not all the time Had allergies when younger Ongoing congestion Here with his mother Says she has noticed him breathing much heavier, like "darth vader" when awake at times Pt says he hasnt noticed this as much  Relevant past medical, surgical, family and social history reviewed. Allergies and medications reviewed and updated. History  Smoking Status  . Never Smoker  Smokeless Tobacco  . Never Used   ROS: Per HPI   Objective:    BP 137/89   Pulse 97   Temp 97 F (36.1 C) (Oral)   Resp 20   Ht 6' (1.829 m)   Wt 205 lb 9.6 oz (93.3 kg)   SpO2 97%   BMI 27.88 kg/m   Wt Readings from Last 3 Encounters:  09/06/16 205 lb 9.6 oz (93.3 kg)  06/20/16 197 lb 12.8 oz (89.7 kg)  11/15/15 199 lb 8 oz (90.5 kg)    Gen: NAD, alert, cooperative with exam, NCAT, congested EYES: EOMI, no conjunctival injection, or no icterus ENT:  TMs with clear effusion b/l, OP with mild erythema, enlarged tonsils b/l LYMPH: no cervical LAD CV: NRRR, normal S1/S2, no murmur, distal pulses 2+ b/l Resp: CTABL, no wheezes, normal WOB Abd: +BS, soft, NTND. no guarding or organomegaly Ext: No edema, warm Neuro: Alert and oriented MSK: normal muscle bulk  Assessment & Plan:  Marcus Martinez was seen today for shortness of breath.  Diagnoses and all orders for this visit:  Allergic rhinitis due to other allergic trigger, unspecified chronicity, unspecified seasonality Start below If not improving refer to ENT for possible tonsillectomy -     cetirizine (ZYRTEC) 10 MG tablet; Take 1 tablet (10 mg total) by mouth daily. -     fluticasone (FLONASE) 50 MCG/ACT nasal spray; Place 2 sprays into both nostrils  daily.  Follow up plan: 4 weeks Rex Krasarol Enas Winchel, MD [facility]

## 2017-11-14 ENCOUNTER — Encounter: Payer: Self-pay | Admitting: Pediatrics

## 2017-11-14 ENCOUNTER — Ambulatory Visit (INDEPENDENT_AMBULATORY_CARE_PROVIDER_SITE_OTHER): Payer: 59 | Admitting: Pediatrics

## 2017-11-14 VITALS — BP 125/72 | HR 75 | Temp 98.2°F | Ht 72.0 in | Wt 184.0 lb

## 2017-11-14 DIAGNOSIS — Z0000 Encounter for general adult medical examination without abnormal findings: Secondary | ICD-10-CM | POA: Diagnosis not present

## 2017-11-14 DIAGNOSIS — R634 Abnormal weight loss: Secondary | ICD-10-CM | POA: Diagnosis not present

## 2017-11-14 DIAGNOSIS — R5383 Other fatigue: Secondary | ICD-10-CM

## 2017-11-14 DIAGNOSIS — S0012XA Contusion of left eyelid and periocular area, initial encounter: Secondary | ICD-10-CM | POA: Diagnosis not present

## 2017-11-14 NOTE — Progress Notes (Signed)
Subjective:   Patient ID: Marcus Martinez, male    DOB: [birth year], 27 y.o.   MRN: [id number] CC: Annual Exam; Black Eye (Left); Weight Loss; and Fatigue  HPI: Marcus Martinez is a 27 y.o. male presenting for Annual Exam; [place] (Left); Weight Loss; and Fatigue  Works as a Furniture conservator/restorer at [employer].  Yesterday was leaving work in a pole hit him in left eyebrow/eye.  Has a black eye today.  Feels sore in the eyelid.  No pain in the eye itself.  No change in his vision.  No pain with extraocular movements other with then when he is trying to lift his eyelid to look up.  Feels slightly better today than it did yesterday.  Here today with his mom.  They are worried about a 20-25 pound weight loss they think over the last 4 weeks.  Last weight in our system was from over a year ago when patient weighed 205 pounds.  Today he weighs 184 pounds.  He says his appetite has been fine.  He eats 2-3 meals a day, sometimes skips breakfast.  No abdominal pain.  No nausea.  Normal stooling.  He does feel more tired than usual for the last couple weeks.  He has been working more overtime up to 10 hours a day daily but now back to regular schedule.  He goes to bed around 10:00-11:00pm, wakes up at 5:45 AM.  He says he feels like he gets good rest at night.  He does snore most nights per mom.  No pauses with his breathing.  He was not feeling tired before the last few weeks.  No fevers, no easy bruising.  2 lifetime male sexual partners.  Never been screened for STI's.  No symptoms right now.  Denies current tobacco, alcohol, marijuana, other drug use.  Says his mood has been fine.  Relevant past medical, surgical, family and social history reviewed. Allergies and medications reviewed and updated. Social History   Tobacco Use  Smoking Status Former Smoker  . Packs/day: 0.25  . Types: Cigarettes  Smokeless Tobacco Never Used   ROS: All systems negative other than what is in the HPI.  Objective:    BP  125/72   Pulse 75   Temp 98.2 F (36.8 C) (Oral)   Ht 6' (1.829 m)   Wt 184 lb (83.5 kg)   BMI 24.95 kg/m   Wt Readings from Last 3 Encounters:  11/14/17 184 lb (83.5 kg)  09/06/16 205 lb 9.6 oz (93.3 kg)  06/20/16 197 lb 12.8 oz (89.7 kg)    Gen: NAD, alert, cooperative with exam, NCAT EYES: EOMI, left eye lid slightly swollen, bruised.  Pupils equal and reactive to light and accommodation.  Normal conjunctiva bilaterally.  Slightly tender over left eyebrow.  No step-offs or point tenderness. ENT:  TMs pearly gray b/l, OP without erythema, grade 3 tonsils LYMPH: no cervical LAD CV: NRRR, normal S1/S2, no murmur, distal pulses 2+ b/l Resp: CTABL, no wheezes, normal WOB Abd: +BS, soft, NTND. no guarding or organomegaly Ext: No edema, warm Neuro: Alert and oriented, strength equal b/l UE and LE, coordination grossly normal MSK: normal muscle bulk  Assessment & Plan:  Gardy was seen today for annual exam, black eye, weight loss and fatigue.  Diagnoses and all orders for this visit:  Encounter for preventive care -     CBC with Differential/Platelet -     CMP14+EGFR -     HIV antibody -  GC/Chlamydia Probe Amp -     RPR  Other fatigue -     TSH  Weight loss Most recent weight in clinic was from over a year ago.  No fevers, appetite has been fine.  Given patient's concern about noticing weight change, will get blood work as above.   Black eye of left side, initial encounter Pain improved since yesterday.  Vision unaffected.  Return precautions discussed.  Follow up plan: Return in about 4 weeks (around 12/12/2017). Assunta Found, MD [place]

## 2017-11-15 ENCOUNTER — Telehealth: Payer: Self-pay | Admitting: Pediatrics

## 2017-11-15 LAB — CBC WITH DIFFERENTIAL/PLATELET
BASOS: 0 %
Basophils Absolute: 0 10*3/uL (ref 0.0–0.2)
EOS (ABSOLUTE): 0 10*3/uL (ref 0.0–0.4)
EOS: 0 %
Hematocrit: 51.2 % — ABNORMAL HIGH (ref 37.5–51.0)
Hemoglobin: 16.5 g/dL (ref 13.0–17.7)
IMMATURE GRANS (ABS): 0 10*3/uL (ref 0.0–0.1)
IMMATURE GRANULOCYTES: 0 %
LYMPHS: 12 %
Lymphocytes Absolute: 1.1 10*3/uL (ref 0.7–3.1)
MCH: 29.5 pg (ref 26.6–33.0)
MCHC: 32.2 g/dL (ref 31.5–35.7)
MCV: 91 fL (ref 79–97)
Monocytes Absolute: 0.7 10*3/uL (ref 0.1–0.9)
Monocytes: 7 %
NEUTROS PCT: 81 %
Neutrophils Absolute: 7.4 10*3/uL — ABNORMAL HIGH (ref 1.4–7.0)
PLATELETS: 235 10*3/uL (ref 150–379)
RBC: 5.6 x10E6/uL (ref 4.14–5.80)
RDW: 13.7 % (ref 12.3–15.4)
WBC: 9.2 10*3/uL (ref 3.4–10.8)

## 2017-11-15 LAB — TSH: TSH: 1.22 u[IU]/mL (ref 0.450–4.500)

## 2017-11-15 LAB — CMP14+EGFR
A/G RATIO: 1.5 (ref 1.2–2.2)
ALT: 10 IU/L (ref 0–44)
AST: 15 IU/L (ref 0–40)
Albumin: 4.6 g/dL (ref 3.5–5.5)
Alkaline Phosphatase: 83 IU/L (ref 39–117)
BUN/Creatinine Ratio: 13 (ref 9–20)
BUN: 11 mg/dL (ref 6–20)
Bilirubin Total: 0.6 mg/dL (ref 0.0–1.2)
CALCIUM: 9.6 mg/dL (ref 8.7–10.2)
CO2: 23 mmol/L (ref 20–29)
CREATININE: 0.87 mg/dL (ref 0.76–1.27)
Chloride: 105 mmol/L (ref 96–106)
GFR, EST AFRICAN AMERICAN: 138 mL/min/{1.73_m2} (ref >59)
GFR, EST NON AFRICAN AMERICAN: 119 mL/min/{1.73_m2} (ref >59)
Globulin, Total: 3 g/dL (ref 1.5–4.5)
Glucose: 106 mg/dL — ABNORMAL HIGH (ref 65–99)
POTASSIUM: 3.9 mmol/L (ref 3.5–5.2)
Sodium: 144 mmol/L (ref 134–144)
TOTAL PROTEIN: 7.6 g/dL (ref 6.0–8.5)

## 2017-11-15 LAB — HIV ANTIBODY (ROUTINE TESTING W REFLEX): HIV Screen 4th Generation wRfx: NONREACTIVE

## 2017-11-15 LAB — RPR: RPR Ser Ql: NONREACTIVE

## 2017-11-15 NOTE — Telephone Encounter (Signed)
Refer to lab note °

## 2017-11-17 LAB — GC/CHLAMYDIA PROBE AMP
Chlamydia trachomatis, NAA: NEGATIVE
Neisseria gonorrhoeae by PCR: NEGATIVE

## 2018-01-07 ENCOUNTER — Ambulatory Visit: Payer: 59 | Admitting: Family Medicine

## 2018-01-07 ENCOUNTER — Encounter: Payer: Self-pay | Admitting: Family Medicine

## 2018-01-07 VITALS — BP 132/83 | HR 77 | Temp 98.4°F | Ht 72.0 in | Wt 181.2 lb

## 2018-01-07 DIAGNOSIS — F32A Depression, unspecified: Secondary | ICD-10-CM

## 2018-01-07 DIAGNOSIS — F329 Major depressive disorder, single episode, unspecified: Secondary | ICD-10-CM | POA: Diagnosis not present

## 2018-01-07 MED ORDER — BUPROPION HCL ER (SR) 150 MG PO TB12: 150.0000 mg | ORAL_TABLET | Freq: Two times a day (BID) | ORAL | 2 refills | Status: DC

## 2018-01-07 NOTE — Patient Instructions (Signed)
Great to meet you!  Start wellbutrin once daily in the morning for 3 days, then increase to twice daily.    Come back in 3-4 weeks for follow up  You will be called to make an appointment with psychiatry but you can certainly call to make things faster.

## 2018-01-07 NOTE — Progress Notes (Signed)
   HPI  Patient presents today pression.  Patient explains he has had issues with depression for quite some time however they have been more prominent for the last 2 to 3 months.  He has frequent thoughts of being better off dead or hurting himself.  He denies any plans or attempts to commit suicide.  He also contracts for safety.  Patient has symptoms of having decreased energy, poor appetite, feeling like a failure, and psychomotor slowing.  He is willing to see a psychiatrist  PMH: Smoking status noted ROS: Per HPI  Objective: BP 132/83   Pulse 77   Temp 98.4 F (36.9 C) (Oral)   Ht 6' (1.829 m)   Wt 181 lb 3.2 oz (82.2 kg)   BMI 24.58 kg/m  Gen: NAD, alert, cooperative with exam HEENT: NCAT CV: RRR, good S1/S2, no murmur Resp: CTABL, no wheezes, non-labored Ext: No edema, warm Neuro: Alert and oriented, No gross deficits  Assessment and plan:  #Depression Patient with persistent symptoms of unipolar depression. Starting Wellbutrin He contracts for safety and is having frequent suicidal thoughts. Recommended psychiatry, referral written.     Orders Placed This Encounter  Procedures  . Ambulatory referral to Psychiatry    Referral Priority:   Routine    Referral Type:   Psychiatric    Referral Reason:   Specialty Services Required    Requested Specialty:   Psychiatry    Number of Visits Requested:   1    Meds ordered this encounter  Medications  . buPROPion (WELLBUTRIN SR) 150 MG 12 hr tablet    Sig: Take 1 tablet (150 mg total) by mouth 2 (two) times daily.    Dispense:  60 tablet    Refill:  2    Murtis Sink, MD [facility] 01/07/2018, 4:56 PM

## 2018-01-31 ENCOUNTER — Ambulatory Visit (HOSPITAL_COMMUNITY): Payer: 59 | Admitting: Psychiatry

## 2018-01-31 ENCOUNTER — Encounter (HOSPITAL_COMMUNITY): Payer: Self-pay | Admitting: Psychiatry

## 2018-01-31 VITALS — BP 144/92 | HR 62 | Ht 72.0 in | Wt 176.0 lb

## 2018-01-31 DIAGNOSIS — F411 Generalized anxiety disorder: Secondary | ICD-10-CM

## 2018-01-31 DIAGNOSIS — F331 Major depressive disorder, recurrent, moderate: Secondary | ICD-10-CM

## 2018-01-31 MED ORDER — ESCITALOPRAM OXALATE 10 MG PO TABS: 10.0000 mg | ORAL_TABLET | Freq: Every day | ORAL | 0 refills | Status: DC
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
## 2018-01-31 NOTE — Patient Instructions (Signed)
Refer to therapy 

## 2018-02-07 ENCOUNTER — Ambulatory Visit: Payer: 59 | Admitting: Family Medicine

## 2018-02-19 ENCOUNTER — Ambulatory Visit (INDEPENDENT_AMBULATORY_CARE_PROVIDER_SITE_OTHER): Payer: 59 | Admitting: Licensed Clinical Social Worker

## 2018-02-19 DIAGNOSIS — F411 Generalized anxiety disorder: Secondary | ICD-10-CM | POA: Diagnosis not present

## 2018-02-19 DIAGNOSIS — F331 Major depressive disorder, recurrent, moderate: Secondary | ICD-10-CM

## 2018-02-20 ENCOUNTER — Encounter (HOSPITAL_COMMUNITY): Payer: Self-pay | Admitting: Licensed Clinical Social Worker
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
## 2018-03-04 ENCOUNTER — Encounter (HOSPITAL_COMMUNITY): Payer: Self-pay | Admitting: Psychiatry

## 2018-03-04 ENCOUNTER — Ambulatory Visit (INDEPENDENT_AMBULATORY_CARE_PROVIDER_SITE_OTHER): Payer: 59 | Admitting: Psychiatry

## 2018-03-04 ENCOUNTER — Other Ambulatory Visit: Payer: Self-pay

## 2018-03-04 VITALS — BP 128/74 | HR 93 | Ht 72.0 in | Wt 177.0 lb

## 2018-03-04 DIAGNOSIS — F331 Major depressive disorder, recurrent, moderate: Secondary | ICD-10-CM

## 2018-03-04 DIAGNOSIS — F17210 Nicotine dependence, cigarettes, uncomplicated: Secondary | ICD-10-CM | POA: Diagnosis not present

## 2018-03-04 DIAGNOSIS — F411 Generalized anxiety disorder: Secondary | ICD-10-CM | POA: Diagnosis not present

## 2018-03-04 MED ORDER — ESCITALOPRAM OXALATE 10 MG PO TABS: 15.0000 mg | ORAL_TABLET | Freq: Every day | ORAL | 1 refills | Status: DC
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
## 2018-03-11 ENCOUNTER — Ambulatory Visit (INDEPENDENT_AMBULATORY_CARE_PROVIDER_SITE_OTHER): Payer: 59 | Admitting: Licensed Clinical Social Worker

## 2018-03-11 DIAGNOSIS — F411 Generalized anxiety disorder: Secondary | ICD-10-CM | POA: Diagnosis not present

## 2018-03-11 DIAGNOSIS — F331 Major depressive disorder, recurrent, moderate: Secondary | ICD-10-CM
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
## 2018-04-01 ENCOUNTER — Ambulatory Visit (HOSPITAL_COMMUNITY): Payer: 59 | Admitting: Psychiatry

## 2018-06-29 ENCOUNTER — Ambulatory Visit (INDEPENDENT_AMBULATORY_CARE_PROVIDER_SITE_OTHER): Payer: 59 | Admitting: Nurse Practitioner

## 2018-06-29 ENCOUNTER — Encounter: Payer: Self-pay | Admitting: Nurse Practitioner

## 2018-06-29 VITALS — BP 146/85 | HR 114 | Temp 97.9°F | Ht 72.0 in | Wt 192.0 lb

## 2018-06-29 DIAGNOSIS — J029 Acute pharyngitis, unspecified: Secondary | ICD-10-CM

## 2018-06-29 DIAGNOSIS — J039 Acute tonsillitis, unspecified: Secondary | ICD-10-CM

## 2018-06-29 MED ORDER — CEFDINIR 300 MG PO CAPS: 300.0000 mg | ORAL_CAPSULE | Freq: Two times a day (BID) | ORAL | 0 refills | Status: DC

## 2018-06-29 NOTE — Patient Instructions (Signed)
Tonsillitis Tonsillitis is an infection of the throat. This infection causes the tonsils to become red, tender, and swollen. Tonsils are tissues in the back of your throat. If bacteria caused your infection, antibiotic medicine will be given to you. Sometimes, symptoms of this infection can be helped with the use of steroid medicine. If your tonsillitis is very bad (severe) and happens often, you may need to get your tonsils removed (tonsillectomy). Follow these instructions at home: Medicines  Take over-the-counter and prescription medicines only as told by your doctor.  If you were prescribed an antibiotic, take it as told by your doctor. Do not stop taking the antibiotic even if you start to feel better. Eating and drinking  Drink enough fluid to keep your pee (urine) clear or pale yellow.  While your throat is sore, eat soft or liquid foods like: ? Soup. ? Sherbert. ? Instant breakfast drinks.  Drink warm fluids.  Eat frozen ice pops. General instructions  Rest as much as possible and get plenty of sleep.  Gargle with a salt-water mixture 3-4 times a day or as needed. To make a salt-water mixture, completely dissolve -1 tsp of salt in 1 cup of warm water.  Wash your hands often with soap and water. If there is no soap and water, use hand sanitizer.  Do not share cups, bottles, or other utensils until your symptoms are gone.  Do not smoke. If you need help quitting, ask your doctor.  Keep all follow-up visits as told by your doctor. This is important. Contact a doctor if:  You have large, tender lumps in your neck.  You have a fever that does not go away after 2-3 days.  You have a rash.  You cough up green, yellow-brown, or bloody fluid.  You cannot swallow liquids or food for 24 hours.  Only one of your tonsils is swollen. Get help right away if:  You have any new symptoms such as: ? Vomiting ? Very bad headache ? Stiff neck ? Chest pain ? Trouble breathing  or swallowing  You have very bad throat pain and also have drooling or voice changes.  You have very bad pain that is not helped by medicine.  You cannot fully open your mouth.  You have redness, swelling, or severe pain anywhere in your neck. Summary  Tonsillitis causes your tonsils to be red, tender, and swollen.  While your throat is sore eat soft or liquid foods.  Gargle with a salt-water mixture 3-4 times a day or as needed.  Do not share cups, bottles, or other utensils until your symptoms are gone. This information is not intended to replace advice given to you by your health care provider. Make sure you discuss any questions you have with your health care provider. Document Released: 02/07/2008 Document Revised: 01/27/2016 Document Reviewed: 02/07/2013 Elsevier Interactive Patient Education  2017 Elsevier Inc.  

## 2018-06-29 NOTE — Progress Notes (Signed)
   Subjective:    Patient ID: Jaiden Dinkins, male    DOB: 06-18-1991, 27 y.o.   MRN: [id number]   Chief Complaint: Sore Throat   HPI Patient come sin today c/o sore throat. Started yesterday morning. Had cold chills and body ache last night.   Review of Systems  Constitutional: Positive for chills and fever.  HENT: Positive for congestion, sore throat and trouble swallowing. Negative for voice change.   Respiratory: Negative for cough and shortness of breath.   Musculoskeletal: Positive for myalgias.  Neurological: Positive for headaches.  Psychiatric/Behavioral: Negative.   All other systems reviewed and are negative.      Objective:   Physical Exam  Constitutional: He is oriented to person, place, and time. He appears well-developed and well-nourished. No distress.  HENT:  Right Ear: Hearing, tympanic membrane and ear canal normal.  Left Ear: Hearing, tympanic membrane and ear canal normal.  Mouth/Throat: Mucous membranes are normal. Posterior oropharyngeal edema and posterior oropharyngeal erythema present. No oropharyngeal exudate. Tonsils are 2+ on the right. Tonsils are 2+ on the left.  Eyes: Pupils are equal, round, and reactive to light.  Neck: Normal range of motion.  Pulmonary/Chest: Effort normal and breath sounds normal.  Abdominal: Soft. Bowel sounds are normal.  Neurological: He is alert and oriented to person, place, and time.  Skin: Skin is warm and dry.  Psychiatric: He has a normal mood and affect. His behavior is normal.  Nursing note and vitals reviewed.  BP (!) 146/85   Pulse (!) 114   Temp 97.9 F (36.6 C)   Ht 6' (1.829 m)   Wt 192 lb (87.1 kg)   BMI 26.04 kg/m   Strep negative      Assessment & Plan:  Rai Severns in today with chief complaint of Sore Throat   1. Sore throat - Rapid Strep A  2. Tonsillitis Force fluids Motrin or tylenol OTC OTC decongestant Throat lozenges if help New toothbrush in 3 days  Meds ordered this  encounter  Medications  . cefdinir (OMNICEF) 300 MG capsule    Sig: Take 1 capsule (300 mg total) by mouth 2 (two) times daily. 1 po BID    Dispense:  20 capsule    Refill:  0    Order Specific Question:   Supervising Provider    Answer:   Johna Sheriff [[id number]]   Mary-Margaret Daphine Deutscher, FNP

## 2018-07-01 LAB — CULTURE, GROUP A STREP

## 2018-07-01 LAB — RAPID STREP SCREEN (MED CTR MEBANE ONLY): STREP GP A AG, IA W/REFLEX: NEGATIVE

## 2019-06-04 ENCOUNTER — Other Ambulatory Visit: Payer: Self-pay

## 2019-06-04 DIAGNOSIS — Z20822 Contact with and (suspected) exposure to covid-19: Secondary | ICD-10-CM

## 2019-06-05 LAB — NOVEL CORONAVIRUS, NAA: SARS-CoV-2, NAA: NOT DETECTED

## 2019-06-06 ENCOUNTER — Other Ambulatory Visit: Payer: Self-pay

## 2019-06-06 ENCOUNTER — Encounter: Payer: Self-pay | Admitting: Family Medicine

## 2019-06-06 ENCOUNTER — Ambulatory Visit (INDEPENDENT_AMBULATORY_CARE_PROVIDER_SITE_OTHER): Payer: 59 | Admitting: Family Medicine

## 2019-06-06 DIAGNOSIS — Z20828 Contact with and (suspected) exposure to other viral communicable diseases: Secondary | ICD-10-CM

## 2019-06-06 DIAGNOSIS — Z20822 Contact with and (suspected) exposure to covid-19: Secondary | ICD-10-CM
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
## 2019-09-15 ENCOUNTER — Encounter (HOSPITAL_COMMUNITY): Payer: Self-pay | Admitting: Psychiatry

## 2019-09-15 ENCOUNTER — Other Ambulatory Visit: Payer: Self-pay

## 2019-09-15 ENCOUNTER — Ambulatory Visit (INDEPENDENT_AMBULATORY_CARE_PROVIDER_SITE_OTHER): Payer: No Typology Code available for payment source | Admitting: Psychiatry

## 2019-09-15 DIAGNOSIS — F5102 Adjustment insomnia: Secondary | ICD-10-CM | POA: Diagnosis not present

## 2019-09-15 DIAGNOSIS — F411 Generalized anxiety disorder: Secondary | ICD-10-CM

## 2019-09-15 DIAGNOSIS — F331 Major depressive disorder, recurrent, moderate: Secondary | ICD-10-CM | POA: Diagnosis not present

## 2019-09-15 MED ORDER — ESCITALOPRAM OXALATE 10 MG PO TABS: 10.0000 mg | ORAL_TABLET | Freq: Every day | ORAL | 0 refills | Status: DC

## 2019-09-15 MED ORDER — TRAZODONE HCL 50 MG PO TABS: 50.0000 mg | ORAL_TABLET | Freq: Every day | ORAL | 0 refills | Status: DC
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
## 2019-09-22 ENCOUNTER — Encounter (HOSPITAL_COMMUNITY): Payer: Self-pay | Admitting: Licensed Clinical Social Worker

## 2019-09-22 ENCOUNTER — Ambulatory Visit (INDEPENDENT_AMBULATORY_CARE_PROVIDER_SITE_OTHER): Payer: No Typology Code available for payment source | Admitting: Licensed Clinical Social Worker

## 2019-09-22 ENCOUNTER — Other Ambulatory Visit: Payer: Self-pay

## 2019-09-22 DIAGNOSIS — F331 Major depressive disorder, recurrent, moderate: Secondary | ICD-10-CM
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
## 2019-09-30 ENCOUNTER — Encounter (HOSPITAL_COMMUNITY): Payer: Self-pay | Admitting: Licensed Clinical Social Worker

## 2019-09-30 ENCOUNTER — Other Ambulatory Visit: Payer: Self-pay

## 2019-09-30 ENCOUNTER — Ambulatory Visit (INDEPENDENT_AMBULATORY_CARE_PROVIDER_SITE_OTHER): Payer: No Typology Code available for payment source | Admitting: Licensed Clinical Social Worker

## 2019-09-30 DIAGNOSIS — F411 Generalized anxiety disorder: Secondary | ICD-10-CM

## 2019-09-30 DIAGNOSIS — F331 Major depressive disorder, recurrent, moderate: Secondary | ICD-10-CM | POA: Diagnosis not present

## 2019-09-30 NOTE — Progress Notes (Signed)
Virtual Visit via Video Note  I connected with Marcus Martinez on 09/30/19 at 10:00 AM EST by a video enabled telemedicine application and verified that I am speaking with the correct person using two identifiers.  Location: Patient: Home Provider: Office   I discussed the limitations of evaluation and management by telemedicine and the availability of in person appointments. The patient expressed understanding and agreed to proceed.     I discussed the assessment and treatment plan with the patient. The patient was provided an opportunity to ask questions and all were answered. The patient agreed with the plan and demonstrated an understanding of the instructions.   The patient was advised to call back or seek an in-person evaluation if the symptoms worsen or if the condition fails to improve as anticipated.  I provided 55 minutes of non-face-to-face time during this encounter.   Margo Common, LCAS-A

## 2019-09-30 NOTE — Progress Notes (Signed)
   THERAPIST PROGRESS NOTE  Session Time: 06-1054  Participation Level: Active  Behavioral Response: CasualAlertAnxious and Irritable  Type of Therapy: Individual Therapy  Treatment Goals addressed: Anger and Anxiety  Interventions: CBT, Supportive and Reframing  Summary: Marcus Martinez is a 29 y.o. male who presents with Anger issues, Anxiety, and MDD. He reports he is having a bad week since his appeal was denied at work. I spent time inquiring about PT's meaning making w/ his perfomance at work, his communication style, and his interpretation of other's communications w/ him. PT and I work on assertiveness and gratitude exercise. PT admits he has a "doom and gloom" mentality often. I invite PT to consider how his mother's parenting impacted his view of self and his work International aid/development worker. PT has blunted affect throughout session.   PT states is angitation, rumination, and irritability is going up and I recommend he seek an earlier appointment w/ Dr. Gilmore Laroche, if possible.  Suicidal/Homicidal: Nowithout intent/plan  Therapist Response: I used open questions, active listening, and assigned homework of researching the difference b/w assertiveness and agressive communication.  Plan: Return again in 2 weeks.  Diagnosis:    ICD-10-CM   1. Major depressive disorder, recurrent episode, moderate (HCC)  F33.1   2. GAD (generalized anxiety disorder)  F41.1      Margo Common, LCAS-A 09/30/2019

## 2019-10-07 ENCOUNTER — Encounter (HOSPITAL_COMMUNITY): Payer: Self-pay | Admitting: Psychiatry

## 2019-10-07 ENCOUNTER — Ambulatory Visit (INDEPENDENT_AMBULATORY_CARE_PROVIDER_SITE_OTHER): Payer: No Typology Code available for payment source | Admitting: Psychiatry

## 2019-10-07 DIAGNOSIS — F411 Generalized anxiety disorder: Secondary | ICD-10-CM | POA: Diagnosis not present

## 2019-10-07 DIAGNOSIS — F5102 Adjustment insomnia: Secondary | ICD-10-CM | POA: Diagnosis not present

## 2019-10-07 DIAGNOSIS — F331 Major depressive disorder, recurrent, moderate: Secondary | ICD-10-CM

## 2019-10-07 MED ORDER — TRAZODONE HCL 50 MG PO TABS: 50.0000 mg | ORAL_TABLET | Freq: Every day | ORAL | 0 refills | Status: DC

## 2019-10-07 MED ORDER — BUSPIRONE HCL 7.5 MG PO TABS: 7.5000 mg | ORAL_TABLET | Freq: Every day | ORAL | 0 refills | Status: DC | PRN

## 2019-10-07 MED ORDER — ESCITALOPRAM OXALATE 20 MG PO TABS: 20.0000 mg | ORAL_TABLET | Freq: Every day | ORAL | 1 refills | Status: DC
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
## 2019-10-13 ENCOUNTER — Ambulatory Visit (INDEPENDENT_AMBULATORY_CARE_PROVIDER_SITE_OTHER): Payer: No Typology Code available for payment source | Admitting: Licensed Clinical Social Worker

## 2019-10-13 ENCOUNTER — Telehealth: Payer: Self-pay | Admitting: Family Medicine

## 2019-10-13 ENCOUNTER — Encounter (HOSPITAL_COMMUNITY): Payer: Self-pay | Admitting: Licensed Clinical Social Worker

## 2019-10-13 DIAGNOSIS — F331 Major depressive disorder, recurrent, moderate: Secondary | ICD-10-CM | POA: Diagnosis not present

## 2019-10-13 DIAGNOSIS — F411 Generalized anxiety disorder: Secondary | ICD-10-CM

## 2019-10-13 NOTE — Telephone Encounter (Signed)
I can prescribe if I have previous records and if I see pt in office.

## 2019-10-13 NOTE — Progress Notes (Signed)
Virtual Visit via Video Note  I connected with Marcus Martinez on 10/13/19 at 10:00 AM EST by a video enabled telemedicine application and verified that I am speaking with the correct person using two identifiers.  Location: Patient: Home Provider: Office   I discussed the limitations of evaluation and management by telemedicine and the availability of in person appointments. The patient expressed understanding and agreed to proceed.    I discussed the assessment and treatment plan with the patient. The patient was provided an opportunity to ask questions and all were answered. The patient agreed with the plan and demonstrated an understanding of the instructions.   The patient was advised to call back or seek an in-person evaluation if the symptoms worsen or if the condition fails to improve as anticipated.  I provided 50 minutes of non-face-to-face time during this encounter.   Margo Common, LCAS-A    THERAPIST PROGRESS NOTE  Session Time: 06-1049  Participation Level: Active  Behavioral Response: Well GroomedAlertAnxious and Depressed  Type of Therapy: Individual Therapy  Treatment Goals addressed: Coping  Interventions: CBT and Supportive  Summary: Marcus Martinez is a 29 y.o. male who presents with depression and anxiety. He reports he continues to be agitated and have distressing thoughts of not being here. When asked if he has a plan to kill himself. PT states he does not. I advise PT to consider joining a group if he is unable to practice better coping skills to manage his stress and anger. I work w/ PT on discussing a plan for living on his own.    Suicidal/Homicidal: Nowithout intent/plan  Therapist Response: I used open questions, active listening, and CBT based cognitive challenging.   Plan: Informed PT of his need to schedule f/u appointments w/ another therapist. He was agreeable to this and verbalized understanding.   Diagnosis:    ICD-10-CM   1. Major  depressive disorder, recurrent episode, moderate (HCC)  F33.1   2. GAD (generalized anxiety disorder)  F41.1       Margo Common, LCAS-A 10/13/2019

## 2019-10-13 NOTE — Telephone Encounter (Signed)
Appt made. Patient will come by to fill out records release

## 2019-10-14 ENCOUNTER — Ambulatory Visit (HOSPITAL_COMMUNITY): Payer: No Typology Code available for payment source | Admitting: Psychiatry

## 2019-10-21 ENCOUNTER — Ambulatory Visit: Payer: 59 | Admitting: Family Medicine

## 2019-10-29 ENCOUNTER — Other Ambulatory Visit (HOSPITAL_COMMUNITY): Payer: Self-pay | Admitting: Psychiatry

## 2019-11-04 ENCOUNTER — Ambulatory Visit (HOSPITAL_COMMUNITY): Payer: No Typology Code available for payment source | Admitting: Psychiatry

## 2019-11-26 ENCOUNTER — Other Ambulatory Visit (HOSPITAL_COMMUNITY): Payer: Self-pay | Admitting: Psychiatry

## 2020-02-03 ENCOUNTER — Ambulatory Visit: Payer: Self-pay | Admitting: Family Medicine

## 2020-05-25 ENCOUNTER — Ambulatory Visit: Payer: Self-pay | Attending: Internal Medicine

## 2020-05-25 DIAGNOSIS — Z23 Encounter for immunization: Secondary | ICD-10-CM

## 2020-05-25 NOTE — Progress Notes (Signed)
   Covid-19 Vaccination Clinic  Name:  Marcus Martinez    MRN: [id number] DOB: June 23, 1991  05/25/2020  [name] was observed post Covid-19 immunization for 15 minutes without incident. He was provided with Vaccine Information Sheet and instruction to access the V-Safe system.   [name] was instructed to call 911 with any severe reactions post vaccine: Marland Kitchen Difficulty breathing  . Swelling of face and throat  . A fast heartbeat  . A bad rash all over body  . Dizziness and weakness   Immunizations Administered    Name Date Dose VIS Date Route   Pfizer COVID-19 Vaccine 05/25/2020 10:13 AM 0.3 mL 10/29/2018 Intramuscular   Manufacturer: [employer], [employer]   Lot: [id number]   NDC: [address]-9

## 2020-06-30 ENCOUNTER — Telehealth (INDEPENDENT_AMBULATORY_CARE_PROVIDER_SITE_OTHER): Payer: Managed Care, Other (non HMO) | Admitting: Family Medicine

## 2020-06-30 ENCOUNTER — Ambulatory Visit: Payer: Self-pay | Admitting: Nurse Practitioner

## 2020-06-30 VITALS — BP 134/80

## 2020-06-30 DIAGNOSIS — Z720 Tobacco use: Secondary | ICD-10-CM

## 2020-06-30 DIAGNOSIS — R030 Elevated blood-pressure reading, without diagnosis of hypertension: Secondary | ICD-10-CM

## 2020-06-30 NOTE — Patient Instructions (Signed)
DASH Eating Plan DASH stands for "Dietary Approaches to Stop Hypertension." The DASH eating plan is a healthy eating plan that has been shown to reduce high blood pressure (hypertension). It may also reduce your risk for type 2 diabetes, heart disease, and stroke. The DASH eating plan may also help with weight loss. What are tips for following this plan?  General guidelines  Avoid eating more than 2,300 mg (milligrams) of salt (sodium) a day. If you have hypertension, you may need to reduce your sodium intake to 1,500 mg a day.  Limit alcohol intake to no more than 1 drink a day for nonpregnant women and 2 drinks a day for men. One drink equals 12 oz of beer, 5 oz of wine, or 1 oz of hard liquor.  Work with your health care provider to maintain a healthy body weight or to lose weight. Ask what an ideal weight is for you.  Get at least 30 minutes of exercise that causes your heart to beat faster (aerobic exercise) most days of the week. Activities may include walking, swimming, or biking.  Work with your health care provider or diet and nutrition specialist (dietitian) to adjust your eating plan to your individual calorie needs. Reading food labels   Check food labels for the amount of sodium per serving. Choose foods with less than 5 percent of the Daily Value of sodium. Generally, foods with less than 300 mg of sodium per serving fit into this eating plan.  To find whole grains, look for the word "whole" as the first word in the ingredient list. Shopping  Buy products labeled as "low-sodium" or "no salt added."  Buy fresh foods. Avoid canned foods and premade or frozen meals. Cooking  Avoid adding salt when cooking. Use salt-free seasonings or herbs instead of table salt or sea salt. Check with your health care provider or pharmacist before using salt substitutes.  Do not fry foods. Cook foods using healthy methods such as baking, boiling, grilling, and broiling instead.  Cook with  heart-healthy oils, such as olive, canola, soybean, or sunflower oil. Meal planning  Eat a balanced diet that includes: ? 5 or more servings of fruits and vegetables each day. At each meal, try to fill half of your plate with fruits and vegetables. ? Up to 6-8 servings of whole grains each day. ? Less than 6 oz of lean meat, poultry, or fish each day. A 3-oz serving of meat is about the same size as a deck of cards. One egg equals 1 oz. ? 2 servings of low-fat dairy each day. ? A serving of nuts, seeds, or beans 5 times each week. ? Heart-healthy fats. Healthy fats called Omega-3 fatty acids are found in foods such as flaxseeds and coldwater fish, like sardines, salmon, and mackerel.  Limit how much you eat of the following: ? Canned or prepackaged foods. ? Food that is high in trans fat, such as fried foods. ? Food that is high in saturated fat, such as fatty meat. ? Sweets, desserts, sugary drinks, and other foods with added sugar. ? Full-fat dairy products.  Do not salt foods before eating.  Try to eat at least 2 vegetarian meals each week.  Eat more home-cooked food and less restaurant, buffet, and fast food.  When eating at a restaurant, ask that your food be prepared with less salt or no salt, if possible. What foods are recommended? The items listed may not be a complete list. Talk with your dietitian about   what dietary choices are best for you. Grains Whole-grain or whole-wheat bread. Whole-grain or whole-wheat pasta. Brown rice. Oatmeal. Quinoa. Bulgur. Whole-grain and low-sodium cereals. Pita bread. Low-fat, low-sodium crackers. Whole-wheat flour tortillas. Vegetables Fresh or frozen vegetables (raw, steamed, roasted, or grilled). Low-sodium or reduced-sodium tomato and vegetable juice. Low-sodium or reduced-sodium tomato sauce and tomato paste. Low-sodium or reduced-sodium canned vegetables. Fruits All fresh, dried, or frozen fruit. Canned fruit in natural juice (without  added sugar). Meat and other protein foods Skinless chicken or turkey. Ground chicken or turkey. Pork with fat trimmed off. Fish and seafood. Egg whites. Dried beans, peas, or lentils. Unsalted nuts, nut butters, and seeds. Unsalted canned beans. Lean cuts of beef with fat trimmed off. Low-sodium, lean deli meat. Dairy Low-fat (1%) or fat-free (skim) milk. Fat-free, low-fat, or reduced-fat cheeses. Nonfat, low-sodium ricotta or cottage cheese. Low-fat or nonfat yogurt. Low-fat, low-sodium cheese. Fats and oils Soft margarine without trans fats. Vegetable oil. Low-fat, reduced-fat, or light mayonnaise and salad dressings (reduced-sodium). Canola, safflower, olive, soybean, and sunflower oils. Avocado. Seasoning and other foods Herbs. Spices. Seasoning mixes without salt. Unsalted popcorn and pretzels. Fat-free sweets. What foods are not recommended? The items listed may not be a complete list. Talk with your dietitian about what dietary choices are best for you. Grains Baked goods made with fat, such as croissants, muffins, or some breads. Dry pasta or rice meal packs. Vegetables Creamed or fried vegetables. Vegetables in a cheese sauce. Regular canned vegetables (not low-sodium or reduced-sodium). Regular canned tomato sauce and paste (not low-sodium or reduced-sodium). Regular tomato and vegetable juice (not low-sodium or reduced-sodium). Pickles. Olives. Fruits Canned fruit in a light or heavy syrup. Fried fruit. Fruit in cream or butter sauce. Meat and other protein foods Fatty cuts of meat. Ribs. Fried meat. Bacon. Sausage. Bologna and other processed lunch meats. Salami. Fatback. Hotdogs. Bratwurst. Salted nuts and seeds. Canned beans with added salt. Canned or smoked fish. Whole eggs or egg yolks. Chicken or turkey with skin. Dairy Whole or 2% milk, cream, and half-and-half. Whole or full-fat cream cheese. Whole-fat or sweetened yogurt. Full-fat cheese. Nondairy creamers. Whipped toppings.  Processed cheese and cheese spreads. Fats and oils Butter. Stick margarine. Lard. Shortening. Ghee. Bacon fat. Tropical oils, such as coconut, palm kernel, or palm oil. Seasoning and other foods Salted popcorn and pretzels. Onion salt, garlic salt, seasoned salt, table salt, and sea salt. Worcestershire sauce. Tartar sauce. Barbecue sauce. Teriyaki sauce. Soy sauce, including reduced-sodium. Steak sauce. Canned and packaged gravies. Fish sauce. Oyster sauce. Cocktail sauce. Horseradish that you find on the shelf. Ketchup. Mustard. Meat flavorings and tenderizers. Bouillon cubes. Hot sauce and Tabasco sauce. Premade or packaged marinades. Premade or packaged taco seasonings. Relishes. Regular salad dressings. Where to find more information:  National Heart, Lung, and Blood Institute: www.nhlbi.nih.gov  American Heart Association: www.heart.org Summary  The DASH eating plan is a healthy eating plan that has been shown to reduce high blood pressure (hypertension). It may also reduce your risk for type 2 diabetes, heart disease, and stroke.  With the DASH eating plan, you should limit salt (sodium) intake to 2,300 mg a day. If you have hypertension, you may need to reduce your sodium intake to 1,500 mg a day.  When on the DASH eating plan, aim to eat more fresh fruits and vegetables, whole grains, lean proteins, low-fat dairy, and heart-healthy fats.  Work with your health care provider or diet and nutrition specialist (dietitian) to adjust your eating plan to your   individual calorie needs. This information is not intended to replace advice given to you by your health care provider. Make sure you discuss any questions you have with your health care provider. Document Revised: 08/03/2017 Document Reviewed: 08/14/2016 Elsevier Patient Education  2020 Elsevier Inc.   How to Take Your Blood Pressure You can take your blood pressure at home with a machine. You may need to check your blood pressure  at home:  To check if you have high blood pressure (hypertension).  To check your blood pressure over time.  To make sure your blood pressure medicine is working. Supplies needed: You will need a blood pressure machine, or monitor. You can buy one at a drugstore or online. When choosing one:  Choose one with an arm cuff.  Choose one that wraps around your upper arm. Only one finger should fit between your arm and the cuff.  Do not choose one that measures your blood pressure from your wrist or finger. Your doctor can suggest a monitor. How to prepare Avoid these things for 30 minutes before checking your blood pressure:  Drinking caffeine.  Drinking alcohol.  Eating.  Smoking.  Exercising. Five minutes before checking your blood pressure:  Pee.  Sit in a dining chair. Avoid sitting in a soft couch or armchair.  Be quiet. Do not talk. How to take your blood pressure Follow the instructions that came with your machine. If you have a digital blood pressure monitor, these may be the instructions: 1. Sit up straight. 2. Place your feet on the floor. Do not cross your ankles or legs. 3. Rest your left arm at the level of your heart. You may rest it on a table, desk, or chair. 4. Pull up your shirt sleeve. 5. Wrap the blood pressure cuff around the upper part of your left arm. The cuff should be 1 inch (2.5 cm) above your elbow. It is best to wrap the cuff around bare skin. 6. Fit the cuff snugly around your arm. You should be able to place only one finger between the cuff and your arm. 7. Put the cord inside the groove of your elbow. 8. Press the power button. 9. Sit quietly while the cuff fills with air and loses air. 10. Write down the numbers on the screen. 11. Wait 2-3 minutes and then repeat steps 1-10. What do the numbers mean? Two numbers make up your blood pressure. The first number is called systolic pressure. The second is called diastolic pressure. An example of  a blood pressure reading is "120 over 80" (or 120/80). If you are an adult and do not have a medical condition, use this guide to find out if your blood pressure is normal: Normal  First number: below 120.  Second number: below 80. Elevated  First number: 120-129.  Second number: below 80. Hypertension stage 1  First number: 130-139.  Second number: 80-89. Hypertension stage 2  First number: 140 or above.  Second number: 90 or above. Your blood pressure is above normal even if only the top or bottom number is above normal. Follow these instructions at home:  Check your blood pressure as often as your doctor tells you to.  Take your monitor to your next doctor's appointment. Your doctor will: ? Make sure you are using it correctly. ? Make sure it is working right.  Make sure you understand what your blood pressure numbers should be.  Tell your doctor if your medicines are causing side effects. Contact a   doctor if:  Your blood pressure keeps being high. Get help right away if:  Your first blood pressure number is higher than 180.  Your second blood pressure number is higher than 120. This information is not intended to replace advice given to you by your health care provider. Make sure you discuss any questions you have with your health care provider. Document Revised: 08/03/2017 Document Reviewed: 01/28/2016 Elsevier Patient Education  2020 Elsevier Inc.   

## 2020-06-30 NOTE — Progress Notes (Signed)
MyChart Video visit  Subjective: CC: blood pressure PCP: Sonny Masters, FNP (Inactive) [id number] Marcus Martinez is a 29 y.o. male. Patient provides verbal consent for consult held via video.  Due to COVID-19 pandemic this visit was conducted virtually. This visit type was conducted due to national recommendations for restrictions regarding the COVID-19 Pandemic (e.g. social distancing, sheltering in place) in an effort to limit this patient's exposure and mitigate transmission in our community. All issues noted in this document were discussed and addressed.  A physical exam was not performed with this format.   Location of patient: home Location of provider: [facility] Others present for call: none  1. High blood pressure He has had 2 blood pressure readings as high as 140/100s; today 134/80.  He reports intermittent high salt intake.  He hydrated well before today's appointment and has avoided caffeine.  He has a wrist blood pressure cuff but he has not been using it because he was not sure of the accuracy of this blood pressure monitor.  He does not report any chest pain, shortness of breath, dizziness or falls.  He does note that he is an active every day smoker of 2 to 3 cigarettes/day.  He does not really maintain a strict diet and often will get convenience foods with the occasional home-cooked meal.  He does indulge in sodas.  He is physically active at work.   ROS: Per HPI  Allergies  Allergen Reactions  . Penicillins Rash   Past Medical History:  Diagnosis Date  . ADHD (attention deficit hyperactivity disorder)     Current Outpatient Medications:  .  busPIRone (BUSPAR) 7.5 MG tablet, TAKE 1 TABLET (7.5 MG TOTAL) BY MOUTH DAILY AS NEEDED., Disp: 90 tablet, Rfl: 1 .  escitalopram (LEXAPRO) 20 MG tablet, TAKE 1 TABLET BY MOUTH EVERY DAY, Disp: 30 tablet, Rfl: 0 .  fexofenadine-pseudoephedrine (ALLEGRA-D 24) 180-240 MG 24 hr tablet, Take 1 tablet by mouth daily., Disp: , Rfl:  .  traZODone  (DESYREL) 50 MG tablet, TAKE 1 TABLET BY MOUTH EVERYDAY AT BEDTIME, Disp: 90 tablet, Rfl: 1  Assessment/ Plan: 29 y.o. male   1. Elevated blood-pressure reading, without diagnosis of hypertension Blood pressure is considered in stage I hypertensive range.  We discussed goal of blood pressure of less than 140/90.  DASH diet reinforced.  I will place this in his AVS today.  We discussed hidden salts and ways to avoid those salts.  Advised to stop smoking as this would also help.  Would like him to be seen in 1 month for recheck.  May need to consider starting a diuretic at that time versus calcium channel blocker to aid in blood pressure.  We discussed potential for laboratory draw to check renal function electrolytes as well.  He voiced understanding will follow up as directed  2. Tobacco use Contemplative   Start time: attempted to call 1:37pm, video text sent 1:38pm End time: 1:52pm  Total time spent on patient care (including video visit/ documentation): 16 minutes  Marcus Cimo Hulen Skains, DO [facility] [phone]

## 2020-07-15 ENCOUNTER — Encounter: Payer: Self-pay | Admitting: Family Medicine

## 2020-07-15 ENCOUNTER — Ambulatory Visit (INDEPENDENT_AMBULATORY_CARE_PROVIDER_SITE_OTHER): Payer: Managed Care, Other (non HMO) | Admitting: Family Medicine

## 2020-07-15 VITALS — BP 142/83 | HR 90 | Temp 97.8°F | Ht 72.0 in | Wt 186.0 lb

## 2020-07-15 DIAGNOSIS — J399 Disease of upper respiratory tract, unspecified: Secondary | ICD-10-CM | POA: Diagnosis not present

## 2020-07-15 DIAGNOSIS — J302 Other seasonal allergic rhinitis: Secondary | ICD-10-CM | POA: Diagnosis not present

## 2020-07-15 MED ORDER — FLUTICASONE PROPIONATE 50 MCG/ACT NA SUSP: 1.0000 | Freq: Two times a day (BID) | NASAL | 6 refills | Status: DC | PRN

## 2020-07-15 MED ORDER — MONTELUKAST SODIUM 10 MG PO TABS: 10.0000 mg | ORAL_TABLET | Freq: Every day | ORAL | 3 refills | Status: DC

## 2020-07-15 MED ORDER — LEVOCETIRIZINE DIHYDROCHLORIDE 5 MG PO TABS: 5.0000 mg | ORAL_TABLET | Freq: Every evening | ORAL | 3 refills | Status: DC

## 2020-07-15 NOTE — Progress Notes (Signed)
BP (!) 142/83   Pulse 90   Temp 97.8 F (36.6 C)   Ht 6' (1.829 m)   Wt 186 lb (84.4 kg)   SpO2 94%   BMI 25.23 kg/m    Subjective:   Patient ID: Marcus Martinez, male    DOB: December 27, 1990, 29 y.o.   MRN: [id number]  HPI: Marcus Martinez is a 29 y.o. male presenting on 07/15/2020 for URI, Cough, sneezing, and Nasal Congestion   HPI Patient is coming in today with complaints of cough with sneezing and nasal congestion that he has been fighting work-up in the past week but both like this recurrently.  He says he has chronic allergies and takes the Allegra every day and has been taking some sinus and allergy medicine as well but does not seem to be helping.  Patient denies any fevers or chills or cough or shortness of breath or wheezing.  He says this is just like what he gets recurrently.  He says it is not any worse than what he does get this time a year but he has been getting it more over the past week it is building up and is just like when he normally gets.  He is vaccinated against Covid and denies any loss of taste or smell or sick contacts.  Relevant past medical, surgical, family and social history reviewed and updated as indicated. Interim medical history since our last visit reviewed. Allergies and medications reviewed and updated.  Review of Systems  Constitutional: Negative for chills and fever.  HENT: Positive for congestion, postnasal drip, rhinorrhea and sneezing. Negative for sinus pressure, sinus pain, sore throat and trouble swallowing.   Respiratory: Positive for cough. Negative for shortness of breath and wheezing.   Cardiovascular: Negative for chest pain and leg swelling.  Musculoskeletal: Negative for back pain and gait problem.  Skin: Negative for rash.  All other systems reviewed and are negative.   Per HPI unless specifically indicated above   Allergies as of 07/15/2020      Reactions   Penicillins Rash      Medication List       Accurate as of  July 15, 2020  4:00 PM. If you have any questions, ask your nurse or doctor.        STOP taking these medications   busPIRone 7.5 MG tablet Commonly known as: BUSPAR Stopped by: Marcus Pyle, MD   escitalopram 20 MG tablet Commonly known as: LEXAPRO Stopped by: Marcus Radon Marcus Fiske, MD     TAKE these medications   citalopram 20 MG tablet Commonly known as: CELEXA Take 20 mg by mouth daily.   fexofenadine-pseudoephedrine 180-240 MG 24 hr tablet Commonly known as: ALLEGRA-D 24 Take 1 tablet by mouth daily.   fluticasone 50 MCG/ACT nasal spray Commonly known as: FLONASE Place 1 spray into both nostrils 2 (two) times daily as needed for allergies or rhinitis. Started by: Marcus Pyle, MD   levocetirizine 5 MG tablet Commonly known as: Xyzal Take 1 tablet (5 mg total) by mouth every evening. Started by: Marcus Radon Marcus Oscar, MD   montelukast 10 MG tablet Commonly known as: SINGULAIR Take 1 tablet (10 mg total) by mouth at bedtime. Started by: Marcus Pyle, MD   traZODone 50 MG tablet Commonly known as: DESYREL TAKE 1 TABLET BY MOUTH EVERYDAY AT BEDTIME        Objective:   BP (!) 142/83   Pulse 90   Temp 97.8 F (36.6 C)  Ht 6' (1.829 m)   Wt 186 lb (84.4 kg)   SpO2 94%   BMI 25.23 kg/m   Wt Readings from Last 3 Encounters:  07/15/20 186 lb (84.4 kg)  06/29/18 192 lb (87.1 kg)  01/07/18 181 lb 3.2 oz (82.2 kg)    Physical Exam Vitals and nursing note reviewed.  Constitutional:      General: He is not in acute distress.    Appearance: He is well-developed. He is not diaphoretic.  HENT:     Right Ear: Tympanic membrane and ear canal normal.     Left Ear: Tympanic membrane and ear canal normal.     Nose: Congestion and rhinorrhea present.     Mouth/Throat:     Mouth: Mucous membranes are moist.     Pharynx: Oropharynx is clear.     Tonsils: 3+ on the right. 3+ on the left.  Eyes:     General: No scleral icterus.     Conjunctiva/sclera: Conjunctivae normal.  Neck:     Thyroid: No thyromegaly.  Cardiovascular:     Rate and Rhythm: Normal rate and regular rhythm.     Heart sounds: Normal heart sounds. No murmur heard.   Pulmonary:     Effort: Pulmonary effort is normal. No respiratory distress.     Breath sounds: Normal breath sounds. No wheezing.  Musculoskeletal:        General: Normal range of motion.     Cervical back: Neck supple.  Lymphadenopathy:     Cervical: No cervical adenopathy.  Skin:    General: Skin is warm and dry.     Findings: No rash.  Neurological:     Mental Status: He is alert and oriented to person, place, and time.     Coordination: Coordination normal.  Psychiatric:        Behavior: Behavior normal.       Assessment & Plan:   Problem List Items Addressed This Visit    None    Visit Diagnoses    Seasonal allergic rhinitis, unspecified trigger    -  Primary   Relevant Medications   montelukast (SINGULAIR) 10 MG tablet   levocetirizine (XYZAL) 5 MG tablet   fluticasone (FLONASE) 50 MCG/ACT nasal spray   Upper respiratory disease       Relevant Orders   Novel Coronavirus, NAA ([facility])      Will switch from his current allergy medicine to Xyzal and add Singulair and Flonase and see if that helps things down. Follow up plan: Return if symptoms worsen or fail to improve.  Counseling provided for all of the vaccine components Orders Placed This Encounter  Procedures  . Novel Coronavirus, NAA ([facility])    Marcus Care, MD [facility] 07/15/2020, 4:00 PM

## 2020-07-20 LAB — NOVEL CORONAVIRUS, NAA: SARS-CoV-2, NAA: NOT DETECTED

## 2020-10-25 ENCOUNTER — Other Ambulatory Visit: Payer: Self-pay

## 2020-10-25 ENCOUNTER — Ambulatory Visit (INDEPENDENT_AMBULATORY_CARE_PROVIDER_SITE_OTHER): Payer: Managed Care, Other (non HMO) | Admitting: Nurse Practitioner

## 2020-10-25 ENCOUNTER — Encounter: Payer: Self-pay | Admitting: Nurse Practitioner

## 2020-10-25 VITALS — BP 128/64 | HR 74 | Temp 98.2°F | Ht 72.0 in | Wt 184.2 lb

## 2020-10-25 DIAGNOSIS — R197 Diarrhea, unspecified: Secondary | ICD-10-CM | POA: Insufficient documentation

## 2020-10-25 MED ORDER — PROBIOTIC 250 MG PO CAPS: 250.0000 mg | ORAL_CAPSULE | Freq: Every day | ORAL | 0 refills | Status: DC

## 2020-10-25 MED ORDER — LOPERAMIDE HCL 2 MG PO TABS: 2.0000 mg | ORAL_TABLET | Freq: Four times a day (QID) | ORAL | 0 refills | Status: DC | PRN

## 2020-10-25 NOTE — Assessment & Plan Note (Signed)
This is recurrent diarrhea for patient in the last 10 days.  Patient reports having a dental procedure and was put on antibiotic.  Patient started having diarrhea when he started antibiotic and was having 4-6 diarrheas in a day.  Symptoms gradually improving patient is currently having 2-3 diarrhea in a day.  With no abdominal pain, chills, nausea or vomiting. C. difficile stool samples collected.  Encourage patient to go on a bland diet, push hydration,  probiotic, and Imodium as needed.  Rx sent to pharmacy.  Follow-up with worsening unresolved symptoms.

## 2020-10-25 NOTE — Addendum Note (Signed)
Addended by: Adella Hare B on: 10/25/2020 10:19 AM   Modules accepted: Orders

## 2020-10-25 NOTE — Progress Notes (Signed)
Acute Office Visit  Subjective:    Patient ID: Marcus Martinez, male    DOB: 1991/01/07, 30 y.o.   MRN: [id number]  Chief Complaint  Patient presents with  . Diarrhea    X 10 days    Diarrhea  This is a recurrent problem. Episode onset: The past 10 days. The problem occurs 2 to 4 times per day. The problem has been gradually improving. The stool consistency is described as watery. The patient states that diarrhea does not awaken him from sleep. Pertinent negatives include no abdominal pain, chills, fever, headaches, vomiting or weight loss. Nothing (Recent 10-day antibiotic use for) aggravates the symptoms. He has tried nothing for the symptoms. There is no history of bowel resection, inflammatory bowel disease or irritable bowel syndrome.    Past Medical History:  Diagnosis Date  . ADHD (attention deficit hyperactivity disorder)     History reviewed. No pertinent surgical history.  History reviewed. No pertinent family history.  Social History   Socioeconomic History  . Marital status: Single    Spouse name: Not on file  . Number of children: Not on file  . Years of education: Not on file  . Highest education level: Not on file  Occupational History  . 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  Tobacco Use  . Smoking status: Current Every Day Smoker    Packs/day: 0.25    Types: Cigarettes  . Smokeless tobacco: Never Used  Vaping Use  . Vaping Use: Never used  Substance and Sexual Activity  . Alcohol use: Yes    Comment: occassional  . Drug use: Not Currently    Types: Marijuana  . Sexual activity: Never  Other Topics Concern  . Not on file  Social History Narrative  . Not on file   Social Determinants of Health   Financial Resource Strain: Not on file  Food Insecurity: Not on file  Transportation Needs: Not on file  Physical Activity: Not on file  Stress: Not on file  Social Connections: Not on file  Intimate Partner Violence: Not on file    Outpatient  Medications Prior to Visit  Medication Sig Dispense Refill  . citalopram (CELEXA) 20 MG tablet Take 20 mg by mouth daily.    . fexofenadine-pseudoephedrine (ALLEGRA-D 24) 180-240 MG 24 hr tablet Take 1 tablet by mouth daily.    . fluticasone (FLONASE) 50 MCG/ACT nasal spray Place 1 spray into both nostrils 2 (two) times daily as needed for allergies or rhinitis. 16 g 6  . levocetirizine (XYZAL) 5 MG tablet Take 1 tablet (5 mg total) by mouth every evening. 90 tablet 3  . montelukast (SINGULAIR) 10 MG tablet Take 1 tablet (10 mg total) by mouth at bedtime. 90 tablet 3  . traZODone (DESYREL) 50 MG tablet TAKE 1 TABLET BY MOUTH EVERYDAY AT BEDTIME 90 tablet 1   No facility-administered medications prior to visit.    Allergies  Allergen Reactions  . Penicillins Rash    Review of Systems  Constitutional: Negative for chills, fever and weight loss.  HENT: Negative.   Eyes: Negative.   Respiratory: Negative.   Cardiovascular: Negative.   Gastrointestinal: Positive for diarrhea. Negative for abdominal pain, blood in stool, constipation, nausea and vomiting.  Genitourinary: Negative.   Skin: Negative.   Neurological: Negative for headaches.  All other systems reviewed and are negative.      Objective:    Physical Exam Vitals reviewed.  Constitutional:      Appearance: Normal appearance.  HENT:  Head: Normocephalic.     Nose: Nose normal.  Cardiovascular:     Rate and Rhythm: Normal rate and regular rhythm.     Heart sounds: Normal heart sounds.  Pulmonary:     Effort: Pulmonary effort is normal.     Breath sounds: Normal breath sounds.  Abdominal:     Tenderness: There is no abdominal tenderness. There is no guarding.     Comments: Diarrhea  Musculoskeletal:        General: No tenderness.  Neurological:     Mental Status: He is alert and oriented to person, place, and time.     BP 128/64   Pulse 74   Temp 98.2 F (36.8 C)   Ht 6' (1.829 m)   Wt 184 lb 3.2 oz  (83.6 kg)   SpO2 98%   BMI 24.98 kg/m  Wt Readings from Last 3 Encounters:  10/25/20 184 lb 3.2 oz (83.6 kg)  07/15/20 186 lb (84.4 kg)  06/29/18 192 lb (87.1 kg)    Health Maintenance Due  Topic Date Due  . Hepatitis C Screening  Never done  . INFLUENZA VACCINE  04/04/2020  . COVID-19 Vaccine (2 - Pfizer 3-dose series) 06/15/2020    There are no preventive care reminders to display for this patient.   Lab Results  Component Value Date   TSH 1.220 11/14/2017   Lab Results  Component Value Date   WBC 9.2 11/14/2017   HGB 16.5 11/14/2017   HCT 51.2 (H) 11/14/2017   MCV 91 11/14/2017   PLT 235 11/14/2017   Lab Results  Component Value Date   NA 144 11/14/2017   K 3.9 11/14/2017   CO2 23 11/14/2017   GLUCOSE 106 (H) 11/14/2017   BUN 11 11/14/2017   CREATININE 0.87 11/14/2017   BILITOT 0.6 11/14/2017   ALKPHOS 83 11/14/2017   AST 15 11/14/2017   ALT 10 11/14/2017   PROT 7.6 11/14/2017   ALBUMIN 4.6 11/14/2017   CALCIUM 9.6 11/14/2017   No results found for: CHOL No results found for: HDL No results found for: LDLCALC No results found for: TRIG No results found for: CHOLHDL No results found for: ZYSA6T     Assessment & Plan:   Problem List Items Addressed This Visit      Other   Diarrhea - Primary    This is recurrent diarrhea for patient in the last 10 days.  Patient reports having a dental procedure and was put on antibiotic.  Patient started having diarrhea when he started antibiotic and was having 4-6 diarrheas in a day.  Symptoms gradually improving patient is currently having 2-3 diarrhea in a day.  With no abdominal pain, chills, nausea or vomiting. C. difficile stool samples collected.  Encourage patient to go on a bland diet, push hydration,  probiotic, and Imodium as needed.  Rx sent to pharmacy.  Follow-up with worsening unresolved symptoms.      Relevant Medications   loperamide (IMODIUM A-D) 2 MG tablet   Saccharomyces boulardii  (PROBIOTIC) 250 MG CAPS   Other Relevant Orders   Clostridium difficile EIA       Meds ordered this encounter  Medications  . loperamide (IMODIUM A-D) 2 MG tablet    Sig: Take 1 tablet (2 mg total) by mouth 4 (four) times daily as needed for diarrhea or loose stools.    Dispense:  30 tablet    Refill:  0    Order Specific Question:   Supervising Provider  AnswerRaliegh Ip [[id number]]  . Saccharomyces boulardii (PROBIOTIC) 250 MG CAPS    Sig: Take 250 mg by mouth daily.    Dispense:  60 capsule    Refill:  0    Order Specific Question:   Supervising Provider    Answer:   Raliegh Ip [[id number]]     Daryll Drown, NP

## 2020-10-25 NOTE — Patient Instructions (Signed)
C. difficile stool labs completed results pending, encourage hydration, probiotic, and Imodium.  Please follow-up with worsening or unresolved symptoms. Diarrhea, Adult Diarrhea is when you pass loose and watery poop (stool) often. Diarrhea can make you feel weak and cause you to lose water in your body (get dehydrated). Losing water in your body can cause you to:  Feel tired and thirsty.  Have a dry mouth.  Go pee (urinate) less often. Diarrhea often lasts 2-3 days. However, it can last longer if it is a sign of something more serious. It is important to treat your diarrhea as told by your doctor. Follow these instructions at home: Eating and drinking Follow these instructions as told by your doctor:  Take an ORS (oral rehydration solution). This is a drink that helps you replace fluids and minerals your body lost. It is sold at pharmacies and stores.  Drink plenty of fluids, such as: ? Water. ? Ice chips. ? Diluted fruit juice. ? Low-calorie sports drinks. ? Milk, if you want.  Avoid drinking fluids that have a lot of sugar or caffeine in them.  Eat bland, easy-to-digest foods in small amounts as you are able. These foods include: ? Bananas. ? Applesauce. ? Rice. ? Low-fat (lean) meats. ? Toast. ? Crackers.  Avoid alcohol.  Avoid spicy or fatty foods.      Medicines  Take over-the-counter and prescription medicines only as told by your doctor.  If you were prescribed an antibiotic medicine, take it as told by your doctor. Do not stop using the antibiotic even if you start to feel better. General instructions  Wash your hands often using soap and water. If soap and water are not available, use a hand sanitizer. Others in your home should wash their hands as well. Hands should be washed: ? After using the toilet or changing a diaper. ? Before preparing, cooking, or serving food. ? While caring for a sick person. ? While visiting someone in a hospital.  Drink  enough fluid to keep your pee (urine) pale yellow.  Rest at home while you get better.  Watch your condition for any changes.  Take a warm bath to help with any burning or pain from having diarrhea.  Keep all follow-up visits as told by your doctor. This is important.   Contact a doctor if:  You have a fever.  Your diarrhea gets worse.  You have new symptoms.  You cannot keep fluids down.  You feel light-headed or dizzy.  You have a headache.  You have muscle cramps. Get help right away if:  You have chest pain.  You feel very weak or you pass out (faint).  You have bloody or black poop or poop that looks like tar.  You have very bad pain, cramping, or bloating in your belly (abdomen).  You have trouble breathing or you are breathing very quickly.  Your heart is beating very quickly.  Your skin feels cold and clammy.  You feel confused.  You have signs of losing too much water in your body, such as: ? Dark pee, very little pee, or no pee. ? Cracked lips. ? Dry mouth. ? Sunken eyes. ? Sleepiness. ? Weakness. Summary  Diarrhea is when you pass loose and watery poop (stool) often.  Diarrhea can make you feel weak and cause you to lose water in your body (get dehydrated).  Take an ORS (oral rehydration solution). This is a drink that is sold at pharmacies and stores.  Eat bland,  easy-to-digest foods in small amounts as you are able.  Contact a doctor if your condition gets worse. Get help right away if you have signs that you have lost too much water in your body. This information is not intended to replace advice given to you by your health care provider. Make sure you discuss any questions you have with your health care provider. Document Revised: 01/25/2018 Document Reviewed: 01/25/2018 Elsevier Patient Education  2021 [employer].

## 2020-10-29 LAB — CDIFF NAA+O+P+STOOL CULTURE
E coli, Shiga toxin Assay: NEGATIVE
Toxigenic C. Difficile by PCR: NEGATIVE

## 2020-11-01 NOTE — Progress Notes (Signed)
Pt returning call about labs - please leave on vm if no answer

## 2020-11-02 ENCOUNTER — Telehealth: Payer: Self-pay

## 2020-11-02 NOTE — Telephone Encounter (Signed)
Pt called back about labs - CDff  He is still having bloated, urge to have #2, BM soft/ runny at times. Hard to go, churning stomach Weight loss  He thinks it may be IBS = would like   Any suggestions on what to try in mean time appreciated.Marland Kitchen

## 2020-11-02 NOTE — Telephone Encounter (Signed)
Please lets get patient scheduled for an office visit to do an abdominal x-ray to make sure he is not impacted so I can safely diagnose what is going on and a possible GI referral.   I am not here all next week, if he can get in  before Friday that will be great

## 2020-11-02 NOTE — Telephone Encounter (Signed)
Pt  Called and aware - appt made with Alona Bene tomorrow

## 2020-11-03 ENCOUNTER — Encounter: Payer: Self-pay | Admitting: Family Medicine

## 2020-11-03 ENCOUNTER — Other Ambulatory Visit: Payer: Self-pay

## 2020-11-03 ENCOUNTER — Ambulatory Visit (INDEPENDENT_AMBULATORY_CARE_PROVIDER_SITE_OTHER): Payer: Managed Care, Other (non HMO)

## 2020-11-03 ENCOUNTER — Ambulatory Visit (INDEPENDENT_AMBULATORY_CARE_PROVIDER_SITE_OTHER): Payer: Managed Care, Other (non HMO) | Admitting: Family Medicine

## 2020-11-03 VITALS — BP 134/84 | HR 74 | Temp 97.4°F | Ht 73.0 in | Wt 182.4 lb

## 2020-11-03 DIAGNOSIS — K5903 Drug induced constipation: Secondary | ICD-10-CM | POA: Diagnosis not present

## 2020-11-03 DIAGNOSIS — R197 Diarrhea, unspecified: Secondary | ICD-10-CM

## 2020-11-03 NOTE — Progress Notes (Signed)
Assessment & Plan:  1. Drug-induced constipation I suspect patient initially had diarrhea due to the antibiotics he was taking and that the Imodium caused constipation which he has liquid stool going around.  Patient to start Miralax 1 capful in 6-8 oz beverage of choice once daily as needed.  Advised if after 1 week he does not see an improvement in his symptoms to let me know and I will place a referral to gastroenterology.  Education provided on constipation.  2. Diarrhea, unspecified type - DG Abd 2 Views; Future   Follow up plan: Return if symptoms worsen or fail to improve.  Deliah Boston, MSN, APRN, FNP-C [facility]  Subjective:   Patient ID: Marcus Martinez, male    DOB: 1990/12/18, 30 y.o.   MRN: [id number]  HPI: Marcus Martinez is a 29 y.o. male presenting on 11/03/2020 for Diarrhea (X 1 month)  Patient reports diarrhea x1 month.  Additional symptoms include abdominal pain, bloating, and cramping.  He reports it is hard to have a bowel movement.  He has also had an increase in burping and gas.  He has been taking Imodium AD.  He had stool culture, O&P, and C. difficile testing which were all negative.  All of this started after he was put on antibiotics.   ROS: Negative unless specifically indicated above in HPI.   Relevant past medical history reviewed and updated as indicated.   Allergies and medications reviewed and updated.   Current Outpatient Medications:  .  citalopram (CELEXA) 20 MG tablet, Take 20 mg by mouth daily., Disp: , Rfl:  .  fexofenadine-pseudoephedrine (ALLEGRA-D 24) 180-240 MG 24 hr tablet, Take 1 tablet by mouth daily., Disp: , Rfl:  .  fluticasone (FLONASE) 50 MCG/ACT nasal spray, Place 1 spray into both nostrils 2 (two) times daily as needed for allergies or rhinitis., Disp: 16 g, Rfl: 6 .  levocetirizine (XYZAL) 5 MG tablet, Take 1 tablet (5 mg total) by mouth every evening., Disp: 90 tablet, Rfl: 3 .  loperamide (IMODIUM  A-D) 2 MG tablet, Take 1 tablet (2 mg total) by mouth 4 (four) times daily as needed for diarrhea or loose stools., Disp: 30 tablet, Rfl: 0 .  montelukast (SINGULAIR) 10 MG tablet, Take 1 tablet (10 mg total) by mouth at bedtime., Disp: 90 tablet, Rfl: 3 .  Saccharomyces boulardii (PROBIOTIC) 250 MG CAPS, Take 250 mg by mouth daily., Disp: 60 capsule, Rfl: 0 .  traZODone (DESYREL) 50 MG tablet, TAKE 1 TABLET BY MOUTH EVERYDAY AT BEDTIME, Disp: 90 tablet, Rfl: 1  Allergies  Allergen Reactions  . Penicillins Rash    Objective:   BP 134/84   Pulse 74   Temp (!) 97.4 F (36.3 C) (Temporal)   Ht 6\' 1"  (1.854 m)   Wt 182 lb 6.4 oz (82.7 kg)   SpO2 97%   BMI 24.06 kg/m    Physical Exam Vitals reviewed.  Constitutional:      General: He is not in acute distress.    Appearance: Normal appearance. He is normal weight. He is not ill-appearing, toxic-appearing or diaphoretic.  HENT:     Head: Normocephalic and atraumatic.  Eyes:     General: No scleral icterus.       Right eye: No discharge.        Left eye: No discharge.     Conjunctiva/sclera: Conjunctivae normal.  Cardiovascular:     Rate and Rhythm: Normal rate.  Pulmonary:     Effort: Pulmonary  effort is normal. No respiratory distress.  Abdominal:     General: Abdomen is flat. Bowel sounds are decreased. There is no distension or abdominal bruit. There are no signs of injury.     Palpations: Abdomen is soft. There is no shifting dullness, fluid wave, hepatomegaly, splenomegaly, mass or pulsatile mass.     Tenderness: There is no abdominal tenderness.  Musculoskeletal:        General: Normal range of motion.     Cervical back: Normal range of motion.  Skin:    General: Skin is warm and dry.  Neurological:     Mental Status: He is alert and oriented to person, place, and time. Mental status is at baseline.  Psychiatric:        Mood and Affect: Mood normal.        Behavior: Behavior normal.        Thought Content: Thought  content normal.        Judgment: Judgment normal.

## 2020-11-03 NOTE — Patient Instructions (Signed)
Miralax 1 capful in 6-8 oz beverage of choice once daily as needed.    Constipation, Adult Constipation is when a person has trouble pooping (having a bowel movement). When you have this condition, you may poop fewer than 3 times a week. Your poop (stool) may also be dry, hard, or bigger than normal. Follow these instructions at home: Eating and drinking  Eat foods that have a lot of fiber, such as: ? Fresh fruits and vegetables. ? Whole grains. ? Beans.  Eat less of foods that are low in fiber and high in fat and sugar, such as: ? French fries. ? Hamburgers. ? Cookies. ? Candy. ? Soda.  Drink enough fluid to keep your pee (urine) pale yellow.   General instructions  Exercise regularly or as told by your doctor. Try to do 150 minutes of exercise each week.  Go to the restroom when you feel like you need to poop. Do not hold it in.  Take over-the-counter and prescription medicines only as told by your doctor. These include any fiber supplements.  When you poop: ? Do deep breathing while relaxing your lower belly (abdomen). ? Relax your pelvic floor. The pelvic floor is a group of muscles that support the rectum, bladder, and intestines (as well as the uterus in women).  Watch your condition for any changes. Tell your doctor if you notice any.  Keep all follow-up visits as told by your doctor. This is important. Contact a doctor if:  You have pain that gets worse.  You have a fever.  You have not pooped for 4 days.  You vomit.  You are not hungry.  You lose weight.  You are bleeding from the opening of the butt (anus).  You have thin, pencil-like poop. Get help right away if:  You have a fever, and your symptoms suddenly get worse.  You leak poop or have blood in your poop.  Your belly feels hard or bigger than normal (bloated).  You have very bad belly pain.  You feel dizzy or you faint. Summary  Constipation is when a person poops fewer than 3 times a  week, has trouble pooping, or has poop that is dry, hard, or bigger than normal.  Eat foods that have a lot of fiber.  Drink enough fluid to keep your pee (urine) pale yellow.  Take over-the-counter and prescription medicines only as told by your doctor. These include any fiber supplements. This information is not intended to replace advice given to you by your health care provider. Make sure you discuss any questions you have with your health care provider. Document Revised: 07/09/2019 Document Reviewed: 07/09/2019 Elsevier Patient Education  2021 Elsevier Inc.  

## 2020-11-04 ENCOUNTER — Telehealth: Payer: Self-pay

## 2020-11-04 DIAGNOSIS — R197 Diarrhea, unspecified: Secondary | ICD-10-CM

## 2020-11-04 NOTE — Telephone Encounter (Signed)
Patient is call in regards to xray results - please review and advise next steps for the patient

## 2020-11-04 NOTE — Telephone Encounter (Signed)
X-ray is negative. Proceed as planned. Would he like me to go ahead and place referral to GI?

## 2020-11-04 NOTE — Telephone Encounter (Signed)
Patient aware and would like GI referral in [place].

## 2020-11-05 NOTE — Addendum Note (Signed)
Addended by: Deliah Boston F on: 11/05/2020 12:28 PM   Modules accepted: Orders

## 2020-11-05 NOTE — Telephone Encounter (Signed)
Referral placed.

## 2020-11-10 ENCOUNTER — Telehealth: Payer: Self-pay

## 2020-11-12 ENCOUNTER — Encounter: Payer: Self-pay | Admitting: *Deleted

## 2020-11-15 ENCOUNTER — Ambulatory Visit (INDEPENDENT_AMBULATORY_CARE_PROVIDER_SITE_OTHER): Payer: Managed Care, Other (non HMO) | Admitting: Gastroenterology

## 2020-11-15 ENCOUNTER — Encounter: Payer: Self-pay | Admitting: Gastroenterology

## 2020-11-15 ENCOUNTER — Telehealth: Payer: Self-pay | Admitting: Gastroenterology

## 2020-11-15 ENCOUNTER — Other Ambulatory Visit (INDEPENDENT_AMBULATORY_CARE_PROVIDER_SITE_OTHER): Payer: Managed Care, Other (non HMO)

## 2020-11-15 ENCOUNTER — Other Ambulatory Visit: Payer: Self-pay

## 2020-11-15 VITALS — BP 122/84 | HR 74 | Ht 72.0 in | Wt 183.0 lb

## 2020-11-15 DIAGNOSIS — R634 Abnormal weight loss: Secondary | ICD-10-CM | POA: Diagnosis not present

## 2020-11-15 DIAGNOSIS — R140 Abdominal distension (gaseous): Secondary | ICD-10-CM

## 2020-11-15 DIAGNOSIS — R143 Flatulence: Secondary | ICD-10-CM

## 2020-11-15 DIAGNOSIS — K529 Noninfective gastroenteritis and colitis, unspecified: Secondary | ICD-10-CM

## 2020-11-15 LAB — TSH: TSH: 1.79 u[IU]/mL (ref 0.35–4.50)

## 2020-11-15 LAB — T4, FREE: Free T4: 0.97 ng/dL (ref 0.60–1.60)

## 2020-11-15 MED ORDER — SUPREP BOWEL PREP KIT 17.5-3.13-1.6 GM/177ML PO SOLN: 1.0000 | ORAL | 0 refills | Status: DC

## 2020-11-15 MED ORDER — HYOSCYAMINE SULFATE 0.125 MG SL SUBL
0.1250 mg | SUBLINGUAL_TABLET | Freq: Four times a day (QID) | SUBLINGUAL | 2 refills | Status: DC | PRN
Start: 2020-11-15 — End: 2022-05-26

## 2020-11-15 NOTE — Telephone Encounter (Signed)
I considered it at the time of his office visit because there was also weight loss with this diarrhea, but unfortunately to do so requires a double length slot and would put his procedures into mid-May with our current availability.  The colonoscopy that we put on is already an overbook on that date, so I cannot do an endoscopy that day as well.  I did some lab testing to see if he has positive antibodies for celiac. If those tests are positive, or if colonoscopy and biopsies are unrevealing for source of symptoms, then an upper endoscopy might be necessary and would have to be at a later date.

## 2020-11-15 NOTE — Telephone Encounter (Signed)
Pt is requesting an alternative to his SUPREP since it is too expensive for him

## 2020-11-15 NOTE — Telephone Encounter (Signed)
Pt is requesting a call back from a nurse to discuss possibly having an EGD along with his colonoscopy

## 2020-11-15 NOTE — Telephone Encounter (Signed)
Spoke with patient he wants to know if he can have an EGD added to his upcoming colonoscopy. Patient denies any upper GI symptoms, denies nausea/vomiting,reflux. Please advise, thanks.

## 2020-11-15 NOTE — Patient Instructions (Signed)
You have been scheduled for a colonoscopy. Please follow written instructions given to you at your visit today.  Please pick up your prep supplies at the pharmacy within the next 1-3 days. If you use inhalers (even only as needed), please bring them with you on the day of your procedure.  Your Marcus Martinez has requested that you go to the basement level for lab work before leaving today. Press "B" on the elevator. The lab is located at the first door on the left as you exit the elevator.  If you are age 11 or younger, your body mass index should be between 19-25. Your Body mass index is 24.82 kg/m. If this is out of the aformentioned range listed, please consider follow up with your Primary Care Marcus Martinez.   Due to recent changes in healthcare laws, you may see the results of your imaging and laboratory studies on MyChart before your Marcus Martinez has had a chance to review them.  We understand that in some cases there may be results that are confusing or concerning to you. Not all laboratory results come back in the same time frame and the Marcus Martinez may be waiting for multiple results in order to interpret others.  Please give [place] 48 hours in order for your Marcus Martinez to thoroughly review all the results before contacting the office for clarification of your results.

## 2020-11-15 NOTE — Progress Notes (Addendum)
[facility] Consult Note:  History: Marcus Martinez 11/15/2020  Referring provider: Ivy Lynn, NP  Reason for consult/chief complaint: Diarrhea (Was on an antibiotic for wisdom teeth extraction last month, he has had symptoms for 1 month), Abdominal Pain, Bloated, and Gas   Subjective  HPI:  This is a very pleasant 30 year old man referred by primary care for chronic diarrhea.  It began about 3 months ago, and he does not recall any changes in medicines at diet or other clear triggers for the onset of the symptoms.  There is bloating, gas and lower abdominal crampy discomfort as well.  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  He saw primary care on February 21 for this recurrent problem, and was noted to have had recent 10-day antibiotic course (clindamycin for wisdom teeth).  Diarrhea started before this antibiotic course but then worsened during and after it.  Stool studies collected with results below.  At a follow-up visit there on March 2, the patient was describing constipation from Imodium, and was advised to take MiraLAX briefly.  He has 4-6 BMs most days with the other associated symptoms and is getting increasingly concerned about it.  He also feels that he is down about 20 pounds in these last few months.  Imodium seem to just make him more bloated so he stopped using it.  He does not have any known family history of celiac/gluten intolerance, Crohn's or colitis. Tresean denies episodes of painful eye redness, rash or joint swelling. ROS:  Review of Systems  Constitutional: Negative for appetite change and unexpected weight change.  HENT: Negative for mouth sores and voice change.   Eyes: Negative for pain and redness.  Respiratory: Negative for cough and shortness of breath.   Cardiovascular: Negative for chest pain and  palpitations.  Genitourinary: Negative for dysuria and hematuria.  Musculoskeletal: Negative for arthralgias and myalgias.  Skin: Negative for pallor and rash.  Allergic/Immunologic: Positive for environmental allergies.  Neurological: Negative for weakness and headaches.  Hematological: Negative for adenopathy.  Psychiatric/Behavioral:       Depression and anxiety     Past Medical History: Past Medical History:  Diagnosis Date   ADHD (attention deficit hyperactivity disorder)    GAD (generalized anxiety disorder)    Major depressive disorder      Past Surgical History: Past Surgical History:  Procedure Laterality Date   WISDOM TOOTH EXTRACTION     No prior surgery  Family History: History reviewed. No pertinent family history. No IBD, celiac disease, see above  Social History: Social History   Socioeconomic History   Marital status: Single    Spouse name: Not on file   Number of children: Not on file   Years of education: Not on file   Highest education level: Not on file  Occupational History   Occupation: Stage manager  Tobacco Use   Smoking status: Current Every Day Smoker    Packs/day: 0.25    Types: Cigarettes   Smokeless tobacco: Never Used  Vaping Use   Vaping Use: Never used  Substance and Sexual Activity   Alcohol use: Yes    Comment: occassional   Drug use: Not Currently    Types: Marijuana   Sexual activity: Never  Other Topics Concern   Not on file  Social History Narrative   Not on file  Social Determinants of Health   Financial Resource Strain: Not on file  Food Insecurity: Not on file  Transportation Needs: Not on file  Physical Activity: Not on file  Stress: Not on file  Social Connections: Not on file    Allergies: Allergies  Allergen Reactions   Penicillins Rash    Outpatient Meds: Current Outpatient Medications  Medication Sig Dispense Refill   citalopram (CELEXA) 20 MG tablet Take 20 mg  by mouth daily.     fluticasone (FLONASE) 50 MCG/ACT nasal spray Place 1 spray into both nostrils 2 (two) times daily as needed for allergies or rhinitis. 16 g 6   hyoscyamine (LEVSIN SL) 0.125 MG SL tablet Place 1 tablet (0.125 mg total) under the tongue every 6 (six) hours as needed. 30 tablet 2   levocetirizine (XYZAL) 5 MG tablet Take 1 tablet (5 mg total) by mouth every evening. 90 tablet 3   loperamide (IMODIUM A-D) 2 MG tablet Take 1 tablet (2 mg total) by mouth 4 (four) times daily as needed for diarrhea or loose stools. 30 tablet 0   SUPREP BOWEL PREP KIT 17.5-3.13-1.6 GM/177ML SOLN Take 1 kit by mouth as directed. For colonoscopy prep BIN: [id number] PCN: CN GROUP: [id number] ID: [id number] 354 mL 0   No current facility-administered medications for this visit.      ___________________________________________________________________ Objective   Exam:  BP 122/84 (BP Location: Left Arm, Patient Position: Sitting)    Pulse 74    Ht 6' (1.829 m)    Wt 183 lb (83 kg)    SpO2 98%    BMI 24.82 kg/m  Wt Readings from Last 3 Encounters:  11/15/20 183 lb (83 kg)  11/03/20 182 lb 6.4 oz (82.7 kg)  10/25/20 184 lb 3.2 oz (83.6 kg)   (Note weight stable since primary care visit 3 weeks ago)   General: Well-appearing  Eyes: sclera anicteric, no redness  ENT: oral mucosa moist without lesions, no cervical or supraclavicular lymphadenopathy  CV: RRR without murmur, S1/S2, no JVD, no peripheral edema  Resp: clear to auscultation bilaterally, normal RR and effort noted  GI: soft, no tenderness, with active bowel sounds. No guarding or palpable organomegaly noted.  Skin; warm and dry, no rash or jaundice noted  Neuro: awake, alert and oriented x 3. Normal gross motor function and fluent speech  Labs:  CBC Latest Ref Rng & Units 11/14/2017  WBC 3.4 - 10.8 x10E3/uL 9.2  Hemoglobin 13.0 - 17.7 g/dL 16.5  Hematocrit 37.5 - 51.0 % 51.2(H)  Platelets 150 - 379 x10E3/uL 235   CMP  Latest Ref Rng & Units 11/14/2017  Glucose 65 - 99 mg/dL 106(H)  BUN 6 - 20 mg/dL 11  Creatinine 0.76 - 1.27 mg/dL 0.87  Sodium 134 - 144 mmol/L 144  Potassium 3.5 - 5.2 mmol/L 3.9  Chloride 96 - 106 mmol/L 105  CO2 20 - 29 mmol/L 23  Calcium 8.7 - 10.2 mg/dL 9.6  Total Protein 6.0 - 8.5 g/dL 7.6  Total Bilirubin 0.0 - 1.2 mg/dL 0.6  Alkaline Phos 39 - 117 IU/L 83  AST 0 - 40 IU/L 15  ALT 0 - 44 IU/L 10   On February 21, negative C. difficile PCR, Salmonella, Shigella, Campylobacter and ova and parasites  Radiologic Studies:  CLINICAL DATA:  Diarrhea   EXAM: ABDOMEN - 2 VIEW   COMPARISON:  None.   FINDINGS: The bowel gas pattern is normal. There is no evidence of free air. No radio-opaque calculi or other  significant radiographic abnormality is seen.   IMPRESSION: Negative.     Electronically Signed   By: Rolm Baptise M.D.   On: 11/04/2020 08:44   Assessment: Encounter Diagnoses  Name Primary?   Chronic diarrhea Yes   Weight loss    Abdominal bloating    Flatulence     3 months of chronic diarrhea with bloating gas and reported weight loss.  Weight appears stable over last few weeks. Consider celiac, IBD, IBS.  No clear risk factors for pancreatic or other malabsorptive conditions or SIBO. C. difficile another common pathogens recently ruled out with stool testing. Plan:  TSH/free T4 TTG IgA antibody and total IgA level Colonoscopy.  He was agreeable after discussion of procedure and risks.  The benefits and risks of the planned procedure were described in detail with the patient or (when appropriate) their health care proxy.  Risks were outlined as including, but not limited to, bleeding, infection, perforation, adverse medication reaction leading to cardiac or pulmonary decompensation, pancreatitis (if ERCP).  The limitation of incomplete mucosal visualization was also discussed.  No guarantees or warranties were given.  Several days of a trial off all  dairy products  Trial of Levsin  Thank you for the courtesy of this consult.  Please call me with any questions or concerns.  Nelida Meuse III  CC: Referring provider noted above

## 2020-11-16 ENCOUNTER — Encounter: Payer: Self-pay | Admitting: *Deleted

## 2020-11-16 LAB — IGA: Immunoglobulin A: 192 mg/dL (ref 47–310)

## 2020-11-16 LAB — TISSUE TRANSGLUTAMINASE, IGA: (tTG) Ab, IgA: <1 U/mL

## 2020-11-16 MED ORDER — PLENVU 140 G PO SOLR: 1.0000 | ORAL | 0 refills | Status: DC

## 2020-11-16 NOTE — Telephone Encounter (Signed)
Spoke with patient in regards to Dr. Myrtie Neither' recommendations. Patient also had questions in regards to prep alternative, I spoke with [facility] and she will send in prep for PLENVU and will send patient updated instructions. Advised patient to let us know if the pharmacy has any issues filling this for him or if it is too expensive. Patient advised that once the lab work is reviewed we will reach out to him. Patient verbalized understanding of all information and had no concerns at the end of the call.

## 2020-11-16 NOTE — Telephone Encounter (Signed)
Patient has been made aware that we will switch him over to Plenvu. Orders have been sent to pharmacy for Plenvu and new instructions have been sent for Plenvu via mychart.

## 2020-11-16 NOTE — Telephone Encounter (Signed)
Attempted to reach patient but no answer and voicemail is full. I will attempt to reach him at a later time.

## 2020-11-16 NOTE — Telephone Encounter (Signed)
Lm on vm for patient to return call 

## 2020-11-30 ENCOUNTER — Telehealth: Payer: Self-pay

## 2020-11-30 NOTE — Telephone Encounter (Signed)
Patient returned call and stated that he will be able to do the EGD/colon on Thursday, 12/02/20 at 2:30 PM, arrival time at 1:30 PM.

## 2020-11-30 NOTE — Telephone Encounter (Signed)
Left detailed message for patient to return call. Dr. Myrtie Neither had a cancellation on Thursday, 12/02/20 at 2:30 PM for an EGD/Colon. Advised patient to give me a call back to let me know if this will work for him so that I can get it updated in the system.

## 2020-12-01 ENCOUNTER — Encounter: Payer: Self-pay | Admitting: Certified Registered Nurse Anesthetist

## 2020-12-02 ENCOUNTER — Encounter (INDEPENDENT_AMBULATORY_CARE_PROVIDER_SITE_OTHER): Payer: Self-pay

## 2020-12-02 ENCOUNTER — Other Ambulatory Visit: Payer: Self-pay | Admitting: Gastroenterology

## 2020-12-02 ENCOUNTER — Ambulatory Visit (AMBULATORY_SURGERY_CENTER): Payer: Managed Care, Other (non HMO) | Admitting: Gastroenterology

## 2020-12-02 ENCOUNTER — Other Ambulatory Visit: Payer: Self-pay

## 2020-12-02 ENCOUNTER — Encounter: Payer: Self-pay | Admitting: Gastroenterology

## 2020-12-02 ENCOUNTER — Encounter: Payer: Managed Care, Other (non HMO) | Admitting: Gastroenterology

## 2020-12-02 VITALS — BP 110/75 | HR 59 | Temp 98.4°F | Resp 14 | Ht 72.0 in | Wt 183.0 lb

## 2020-12-02 DIAGNOSIS — R1030 Lower abdominal pain, unspecified: Secondary | ICD-10-CM

## 2020-12-02 DIAGNOSIS — K529 Noninfective gastroenteritis and colitis, unspecified: Secondary | ICD-10-CM

## 2020-12-02 DIAGNOSIS — R634 Abnormal weight loss: Secondary | ICD-10-CM

## 2020-12-02 DIAGNOSIS — R140 Abdominal distension (gaseous): Secondary | ICD-10-CM

## 2020-12-02 MED ORDER — SODIUM CHLORIDE 0.9 % IV SOLN: 500.0000 mL | Freq: Once | INTRAVENOUS | Status: DC

## 2020-12-02 MED ORDER — SODIUM CHLORIDE 0.9 % IV SOLN
500.0000 mL | Freq: Once | INTRAVENOUS | Status: DC
Start: 2020-12-02 — End: 2020-12-02

## 2020-12-02 NOTE — Patient Instructions (Addendum)
Discharge instructions given. Biopsies taken. Resume previous medications. YOU HAD AN ENDOSCOPIC PROCEDURE TODAY AT [facility]:   Refer to the procedure report that was given to you for any specific questions about what was found during the examination.  If the procedure report does not answer your questions, please call your gastroenterologist to clarify.  If you requested that your care partner not be given the details of your procedure findings, then the procedure report has been included in a sealed envelope for you to review at your convenience later.  YOU SHOULD EXPECT: Some feelings of bloating in the abdomen. Passage of more gas than usual.  Walking can help get rid of the air that was put into your GI tract during the procedure and reduce the bloating. If you had a lower endoscopy (such as a colonoscopy or flexible sigmoidoscopy) you may notice spotting of blood in your stool or on the toilet paper. If you underwent a bowel prep for your procedure, you may not have a normal bowel movement for a few days.  Please Note:  You might notice some irritation and congestion in your nose or some drainage.  This is from the oxygen used during your procedure.  There is no need for concern and it should clear up in a day or so.  SYMPTOMS TO REPORT IMMEDIATELY:   Following lower endoscopy (colonoscopy or flexible sigmoidoscopy):  Excessive amounts of blood in the stool  Significant tenderness or worsening of abdominal pains  Swelling of the abdomen that is new, acute  Fever of 100F or higher   Following upper endoscopy (EGD)  Vomiting of blood or coffee ground material  New chest pain or pain under the shoulder blades  Painful or persistently difficult swallowing  New shortness of breath  Fever of 100F or higher  Black, tarry-looking stools  For urgent or emergent issues, a gastroenterologist can be reached at any hour by calling [phone]. Do not use MyChart  messaging for urgent concerns.    DIET:  We do recommend a small meal at first, but then you may proceed to your regular diet.  Drink plenty of fluids but you should avoid alcoholic beverages for 24 hours.  ACTIVITY:  You should plan to take it easy for the rest of today and you should NOT DRIVE or use heavy machinery until tomorrow (because of the sedation medicines used during the test).    FOLLOW UP: Our staff will call the number listed on your records 48-72 hours following your procedure to check on you and address any questions or concerns that you may have regarding the information given to you following your procedure. If we do not reach you, we will leave a message.  We will attempt to reach you two times.  During this call, we will ask if you have developed any symptoms of COVID 19. If you develop any symptoms (ie: fever, flu-like symptoms, shortness of breath, cough etc.) before then, please call [phone].  If you test positive for Covid 19 in the 2 weeks post procedure, please call and report this information to [place].    If any biopsies were taken you will be contacted by phone or by letter within the next 1-3 weeks.  Please call us at [phone] if you have not heard about the biopsies in 3 weeks.    SIGNATURES/CONFIDENTIALITY: You and/or your care partner have signed paperwork which will be entered into your electronic medical record.  These signatures attest to  the fact that that the information above on your After Visit Summary has been reviewed and is understood.  Full responsibility of the confidentiality of this discharge information lies with you and/or your care-partner.  _________________________________    Food Guidelines for those with chronic digestive trouble:  Many people have difficulty digesting certain foods, causing a variety of distressing and embarrassing symptoms such as abdominal pain, bloating and gas.  These foods may need to be avoided or consumed in  small amounts.  Here are some tips that might be helpful for you.  1.   Lactose intolerance is the difficulty or complete inability to digest lactose, the natural sugar in milk and anything made from milk.  This condition is harmless, common, and can begin any time during life.  Some people can digest a modest amount of lactose while others cannot tolerate any.  Also, not all dairy products contain equal amounts of lactose.  For example, hard cheeses such as parmesan have less lactose than soft cheeses such as cheddar.  Yogurt has less lactose than milk or cheese.  Many packaged foods (even many brands of bread) have milk, so read ingredient lists carefully.  It is difficult to test for lactose intolerance, so just try avoiding lactose as much as possible for a week and see what happens with your symptoms.  If you seem to be lactose intolerant, the best plan is to avoid it (but make sure you get calcium from another source).  The next best thing is to use lactase enzyme supplements, available over the counter everywhere.  Just know that many lactose intolerant people need to take several tablets with each serving of dairy to avoid symptoms.  Lastly, a lot of restaurant food is made with milk or butter.  Many are things you might not suspect, such as mashed potatoes, rice and pasta (cooked with butter) and "grilled" items.  If you are lactose intolerant, it never hurts to ask your server what has milk or butter.  2.   Fiber is an important part of your diet, but not all fiber is well-tolerated.  Insoluble fiber such as bran is often consumed by normal gut bacteria and converted into gas.  Soluble fiber such as oats, squash, carrots and green beans are typically tolerated better.  3.   Some types of carbohydrates can be poorly digested.  Examples include: fructose (apples, cherries, pears, raisins and other dried fruits), fructans (onions, zucchini, large amounts of wheat), sorbitol/mannitol/xylitol and  sucralose/Splenda (common artificial sweeteners), and raffinose (lentils, broccoli, cabbage, asparagus, brussel sprouts, many types of beans).  Do a Programmer, multimedia for [employer] [place] and you will find helpful information. Beano, a dietary supplement, will often help with raffinose-containing foods.  As with lactase tablets, you may need several per serving.  4.   Whenever possible, avoid processed food&meats and chemical additives.  High fructose corn syrup, a common sweetener, may be difficult to digest.  Eggs and soy (comes from the soybean, and added to many foods now) are other common bloating/gassy foods.  5.  Regarding gluten:  gluten is a protein mainly found in wheat, but also rye and barley.  There is a condition called celiac sprue, which is an inflammatory reaction in the small intestine causing a variety of digestive symptoms.  Blood testing is highly reliable to look for this condition, and sometimes upper endoscopy with small bowel biopsies may be necessary to make the diagnosis.  Many patients who test negative for celiac sprue report improvement  in their digestive symptoms when they switch to a gluten-free diet.  However, in these "non-celiac gluten sensitive" patients, the true role of gluten in their symptoms is unclear.  Reducing carbohydrates in general may decrease the gas and bloating caused when gut bacteria consume carbs. Also, some of these patients may actually be intolerant of the baker's yeast in bread products rather than the gluten.  Flatbread and other reduced yeast breads might therefore be tolerated.  There is no specific testing available for most food intolerances, which are discovered mainly by dietary elimination.  Please do not embark on a gluten free diet unless directed by your doctor, as it is highly restrictive, and may lead to nutritional deficiencies if not carefully monitored.  Lastly, beware of internet claims offering "personalized" tests for food intolerances.  Such  testing has no reliable scientific evidence to support its reliability and correlation to symptoms.    6.  The best advice is old advice, especially for those with chronic digestive trouble - try to eat "clean".  Balanced diet, avoid processed food, plenty of fruits and vegetables, cut down the sugar, minimal alcohol, avoid tobacco. Make time to care for yourself, get enough sleep, exercise when you can, reduce stress.  Your guts will thank you for it.   - Dr. [facility]

## 2020-12-02 NOTE — Progress Notes (Signed)
Called to room to assist during endoscopic procedure.  Patient ID and intended procedure confirmed with present staff. Received instructions for my participation in the procedure from the performing physician.  

## 2020-12-02 NOTE — Progress Notes (Signed)
Pt's states no medical or surgical changes since previsit or office visit.  CW - vitals 

## 2020-12-02 NOTE — Progress Notes (Signed)
1448 Robinul 0.1 mg IV given due large amount of secretions upon assessment.  MD made aware, vss  

## 2020-12-02 NOTE — Op Note (Signed)
[facility] Patient Name: Marcus Martinez Procedure Date: 12/02/2020 2:47 PM MRN: [id number] Endoscopist: Sherilyn Cooter L. Myrtie Neither , MD Age: 30 Referring MD:  Date of Birth: 07-06-1991 Gender: Male Account #: [id number] Procedure:                Colonoscopy Indications:              Lower abdominal pain, Chronic diarrhea Medicines:                Monitored Anesthesia Care Procedure:                Pre-Anesthesia Assessment:                           - Prior to the procedure, a History and Physical                            was performed, and patient medications and                            allergies were reviewed. The patient's tolerance of                            previous anesthesia was also reviewed. The risks                            and benefits of the procedure and the sedation                            options and risks were discussed with the patient.                            All questions were answered, and informed consent                            was obtained. Prior Anticoagulants: The patient has                            taken no previous anticoagulant or antiplatelet                            agents. ASA Grade Assessment: II - A patient with                            mild systemic disease. After reviewing the risks                            and benefits, the patient was deemed in                            satisfactory condition to undergo the procedure.                           - Prior to the procedure, a History and Physical  was performed, and patient medications and                            allergies were reviewed. The patient's tolerance of                            previous anesthesia was also reviewed. The risks                            and benefits of the procedure and the sedation                            options and risks were discussed with the patient.                            All questions were answered,  and informed consent                            was obtained. Prior Anticoagulants: The patient has                            taken no previous anticoagulant or antiplatelet                            agents. ASA Grade Assessment: II - A patient with                            mild systemic disease. After reviewing the risks                            and benefits, the patient was deemed in                            satisfactory condition to undergo the procedure.                           After obtaining informed consent, the colonoscope                            was passed under direct vision. Throughout the                            procedure, the patient's blood pressure, pulse, and                            oxygen saturations were monitored continuously. The                            Olympus CF-HQ190L [id number]) Colonoscope was                            introduced through the anus and advanced to the the  terminal ileum, with identification of the                            appendiceal orifice and IC valve. The colonoscopy                            was performed without difficulty. The patient                            tolerated the procedure well. The quality of the                            bowel preparation was excellent. The terminal                            ileum, ileocecal valve, appendiceal orifice, and                            rectum were photographed. The bowel preparation                            used was Plenvu. Scope In: 3:03:11 PM Scope Out: 3:15:07 PM Scope Withdrawal Time: 0 hours 8 minutes 59 seconds  Total Procedure Duration: 0 hours 11 minutes 56 seconds  Findings:                 The perianal and digital rectal examinations were                            normal.                           The terminal ileum appeared normal.                           Normal mucosa was found in the entire colon.                             Biopsies for histology were taken with a cold                            forceps from the right colon and left colon for                            evaluation of microscopic colitis.                           The retroflexed view of the distal rectum and anal                            verge was normal and showed no anal or rectal                            abnormalities. Complications:            No immediate complications. Estimated  Blood Loss:     Estimated blood loss was minimal. Impression:               - The examined portion of the ileum was normal.                           - Normal mucosa in the entire examined colon.                            Biopsied.                           - The distal rectum and anal verge are normal on                            retroflexion view.                           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 Patient reports                            some improvement lately on hyoscyamine. Recommendation:           - Patient has a contact number available for                            emergencies. The signs and symptoms of potential                            delayed complications were discussed with the                            patient. Return to normal activities tomorrow.                            Written discharge instructions were provided to the                            patient.                           - Resume previous diet.                           - Continue present medications.                           - Await pathology results.                           - No recommendation at this time regarding repeat                            colonoscopy due to young age.                           -  See the other procedure note for documentation of                            additional recommendations.                           - Return to my office at appointment to be                             scheduled. Kyona Chauncey L. Myrtie Neither, MD 12/02/2020 3:21:36 PM This report has been signed electronically.

## 2020-12-02 NOTE — Progress Notes (Signed)
Report given to PACU, vss 

## 2020-12-02 NOTE — Op Note (Signed)
[facility] Patient Name: Marcus Martinez Procedure Date: 12/02/2020 2:39 PM MRN: [id number] Endoscopist: Sherilyn Cooter L. Myrtie Neither , MD Age: 30 Referring MD:  Date of Birth: [birth year] Gender: Male Account #: [id number] Procedure:                Upper GI endoscopy Indications:              Abdominal bloating, Diarrhea Medicines:                Monitored Anesthesia Care Procedure:                Pre-Anesthesia Assessment:                           - Prior to the procedure, a History and Physical                            was performed, and patient medications and                            allergies were reviewed. The patient's tolerance of                            previous anesthesia was also reviewed. The risks                            and benefits of the procedure and the sedation                            options and risks were discussed with the patient.                            All questions were answered, and informed consent                            was obtained. Prior Anticoagulants: The patient has                            taken no previous anticoagulant or antiplatelet                            agents. ASA Grade Assessment: II - A patient with                            mild systemic disease. After reviewing the risks                            and benefits, the patient was deemed in                            satisfactory condition to undergo the procedure.                           After obtaining informed consent, the endoscope was  passed under direct vision. Throughout the                            procedure, the patient's blood pressure, pulse, and                            oxygen saturations were monitored continuously. The                            Endoscope was introduced through the mouth, and                            advanced to the second part of duodenum. The upper                            GI endoscopy was  accomplished without difficulty.                            The patient tolerated the procedure well. Scope In: Scope Out: Findings:                 The esophagus was normal.                           The stomach was normal.                           The cardia and gastric fundus were normal on                            retroflexion.                           The examined duodenum was normal. Biopsies for                            histology were taken with a cold forceps for                            evaluation of celiac disease. Complications:            No immediate complications. Estimated Blood Loss:     Estimated blood loss was minimal. Impression:               - Normal esophagus.                           - Normal stomach.                           - Normal examined duodenum. Biopsied. Recommendation:           - Patient has a contact number available for                            emergencies. The signs and symptoms of potential  delayed complications were discussed with the                            patient. Return to normal activities tomorrow.                            Written discharge instructions were provided to the                            patient.                           - Resume previous diet.                           - Continue present medications.                           - Await pathology results.                           - See the other procedure note for documentation of                            additional recommendations. Derionna Salvador L. Myrtie Neither, MD 12/02/2020 3:18:59 PM This report has been signed electronically.

## 2020-12-03 ENCOUNTER — Telehealth: Payer: Self-pay

## 2020-12-03 NOTE — Telephone Encounter (Signed)
Per 12/02/20 procedure note - Return to office at appt to be scheduled   Patient is scheduled for a follow up with Dr. Myrtie Neither on Monday, 01/10/21 at 3:40 PM. Letter mailed to patient with appt information.

## 2020-12-06 ENCOUNTER — Telehealth: Payer: Self-pay | Admitting: *Deleted

## 2020-12-06 ENCOUNTER — Telehealth: Payer: Self-pay

## 2020-12-06 NOTE — Telephone Encounter (Signed)
Follow up call made. 

## 2020-12-06 NOTE — Telephone Encounter (Signed)
  Follow up Call-  Call back number 12/02/2020  Post procedure Call Back phone  # [phone]  Permission to leave phone message Yes  Some recent data might be hidden     Patient questions:  Do you have a fever, pain , or abdominal swelling? No. Pain Score  0 *  Have you tolerated food without any problems? Yes.    Have you been able to return to your normal activities? Yes.    Do you have any questions about your discharge instructions: Diet   No. Medications  No. Follow up visit  No.  Do you have questions or concerns about your Care? No.  Actions: * If pain score is 4 or above: No action needed, pain <4.  1. Have you developed a fever since your procedure? No  2.   Have you had an respiratory symptoms (SOB or cough) since your procedure? No  3.   Have you tested positive for COVID 19 since your procedure No  4.   Have you had any family members/close contacts diagnosed with the COVID 19 since your procedure? No  If yes to any of these questions please route to Laverna Peace, RN and Karlton Lemon, RN

## 2020-12-15 ENCOUNTER — Encounter: Payer: Self-pay | Admitting: Gastroenterology

## 2020-12-28 ENCOUNTER — Encounter: Payer: Self-pay | Admitting: Gastroenterology

## 2020-12-28 ENCOUNTER — Ambulatory Visit (INDEPENDENT_AMBULATORY_CARE_PROVIDER_SITE_OTHER): Payer: Managed Care, Other (non HMO) | Admitting: Gastroenterology

## 2020-12-28 VITALS — BP 100/64 | HR 84 | Ht 72.0 in | Wt 176.2 lb

## 2020-12-28 DIAGNOSIS — K580 Irritable bowel syndrome with diarrhea: Secondary | ICD-10-CM | POA: Insufficient documentation

## 2020-12-28 MED ORDER — HYOSCYAMINE SULFATE ER 0.375 MG PO TB12: 0.3750 mg | ORAL_TABLET | Freq: Two times a day (BID) | ORAL | 4 refills | Status: DC

## 2020-12-28 NOTE — Progress Notes (Signed)
12/28/2020 Marcus Martinez [id number] 05-Jun-1991   HISTORY OF PRESENT ILLNESS: This is a 30 year old male who is a patient of Dr. Irving Burton.  He is suspected to have IBS-D after undergoing extensive GI work-up including EGD, colonoscopy, laboratory studies, and stool studies.  Colonoscopy was normal last month with normal random colon biopsies.  EGD was also normal with normal duodenal biopsies.  He is currently using Levsin 0.125 mg once daily, usually in the mornings before work.  He prefers to use it only once daily, but would like to try a higher dose if possible.  He says that he thinks a lot of his stress is related to work and he is actually starting a new job next week.  Diarrhea is managed with Imodium prn.   Past Medical History:  Diagnosis Date  . ADHD (attention deficit hyperactivity disorder)   . GAD (generalized anxiety disorder)   . Major depressive disorder    Past Surgical History:  Procedure Laterality Date  . WISDOM TOOTH EXTRACTION      reports that he has been smoking cigarettes. He has been smoking about 0.25 packs per day. He has never used smokeless tobacco. He reports current alcohol use. He reports previous drug use. Drug: Marijuana. family history is not on file. Allergies  Allergen Reactions  . Penicillins Rash      Outpatient Encounter Medications as of 12/28/2020  Medication Sig  . citalopram (CELEXA) 20 MG tablet Take 20 mg by mouth daily.  . fluticasone (FLONASE) 50 MCG/ACT nasal spray Place 1 spray into both nostrils 2 (two) times daily as needed for allergies or rhinitis.  . hyoscyamine (LEVSIN SL) 0.125 MG SL tablet Place 1 tablet (0.125 mg total) under the tongue every 6 (six) hours as needed.  Marland Kitchen levocetirizine (XYZAL) 5 MG tablet Take 1 tablet (5 mg total) by mouth every evening.  . loperamide (IMODIUM A-D) 2 MG tablet Take 1 tablet (2 mg total) by mouth 4 (four) times daily as needed for diarrhea or loose stools.  . [DISCONTINUED] buPROPion  (WELLBUTRIN SR) 150 MG 12 hr tablet Take 1 tablet (150 mg total) by mouth 2 (two) times daily.   No facility-administered encounter medications on file as of 12/28/2020.     REVIEW OF SYSTEMS  : All other systems reviewed and negative except where noted in the History of Present Illness.   PHYSICAL EXAM: BP 100/64 (BP Location: Left Arm, Patient Position: Sitting, Cuff Size: Normal)   Pulse 84   Ht 6' (1.829 m) Comment: height measured without shoes  Wt 176 lb 4 oz (79.9 kg)   BMI 23.90 kg/m  General: Well developed white male in no acute distress Head: Normocephalic and atraumatic Eyes:  Sclerae anicteric, conjunctiva pink. Ears: Normal auditory acuity Lungs: Clear throughout to auscultation; no W/R/R. Heart: Regular rate and rhythm; no M/R/G. Abdomen: Soft, non-distended.  BS present.  Non-tender. Musculoskeletal: Symmetrical with no gross deformities  Skin: No lesions on visible extremities Extremities: No edema  Neurological: Alert oriented x 4, grossly non-focal Psychological:  Alert and cooperative. Normal mood and affect  ASSESSMENT AND PLAN: *IBS-D:  Extensive GI work-up unremarkable.  Thinks a lot of his stress is related to his job and he starts a new job next week.  Doing well on the levsin but only uses it once a day.  Would like to try to increase dose.  Can try levbid 0.375 mg BID instead.  Prescription sent to pharmacy.  Follow-up in one year or  sooner if needed.   CC:  Daryll Drown, NP

## 2020-12-28 NOTE — Patient Instructions (Addendum)
If you are age 30 or older, your body mass index should be between 23-30. Your Body mass index is 23.9 kg/m. If this is out of the aforementioned range listed, please consider follow up with your Primary Care Provider.  If you are age 73 or younger, your body mass index should be between 19-25. Your Body mass index is 23.9 kg/m. If this is out of the aformentioned range listed, please consider follow up with your Primary Care Provider.   We have sent the following medications to your pharmacy for you to pick up at your convenience: Levbid 0.375 mg  Thank you for choosing me and [facility].  Doug Sou, PA-C

## 2020-12-29 NOTE — Progress Notes (Signed)
____________________________________________________________  Attending physician addendum:  Thank you for sending this case to me. I have reviewed the entire note and agree with the plan.  I will send him a portal message and remind him that he is certainly welcome to see me as needed, especially if the antispasmodic medicine at increased dose is not sufficiently controlling his symptoms.  Amada Jupiter, MD  ____________________________________________________________

## 2021-01-10 ENCOUNTER — Ambulatory Visit: Payer: Managed Care, Other (non HMO) | Admitting: Gastroenterology

## 2021-05-25 ENCOUNTER — Encounter: Payer: Self-pay | Admitting: Family Medicine

## 2021-05-25 ENCOUNTER — Ambulatory Visit (INDEPENDENT_AMBULATORY_CARE_PROVIDER_SITE_OTHER): Payer: Self-pay | Admitting: Family Medicine

## 2021-05-25 VITALS — BP 127/77 | HR 107 | Temp 101.0°F

## 2021-05-25 DIAGNOSIS — R5383 Other fatigue: Secondary | ICD-10-CM

## 2021-05-25 DIAGNOSIS — R0981 Nasal congestion: Secondary | ICD-10-CM

## 2021-05-25 DIAGNOSIS — R509 Fever, unspecified: Secondary | ICD-10-CM

## 2021-05-25 NOTE — Progress Notes (Signed)
BP 127/77   Pulse (!) 107   Temp (!) 101 F (38.3 C)   SpO2 97%    Subjective:   Patient ID: Marcus Martinez, male    DOB: 05-18-1991, 30 y.o.   MRN: [id number]  HPI: Marcus Martinez is a 30 y.o. male presenting on 05/25/2021 for URI (Chills,congestion, fatigue, fever- 101. Fully vaccinated)   HPI Patient's parents were on a trip last week and got COVID and then he started up with symptoms last night.  He said overnight he started developing some fever and some chills and some congestion and fatigue and a fever of 101 overnight.  He has been taking Tylenol and that has been helping some with it.  He says he still feels very achy and has a fever of 101 here in the office today.  Relevant past medical, surgical, family and social history reviewed and updated as indicated. Interim medical history since our last visit reviewed. Allergies and medications reviewed and updated.  Review of Systems  Constitutional:  Positive for chills and fever.  HENT:  Positive for congestion, postnasal drip, rhinorrhea, sinus pressure, sneezing and sore throat. Negative for ear discharge, ear pain and voice change.   Eyes:  Negative for pain, discharge, redness and visual disturbance.  Respiratory:  Positive for cough. Negative for chest tightness, shortness of breath and wheezing.   Cardiovascular:  Negative for chest pain and leg swelling.  Gastrointestinal:  Negative for abdominal pain, constipation and diarrhea.  Genitourinary:  Negative for difficulty urinating.  Musculoskeletal:  Positive for myalgias. Negative for back pain and gait problem.  Skin:  Negative for rash.  Neurological:  Negative for syncope, light-headedness and headaches.  All other systems reviewed and are negative.  Per HPI unless specifically indicated above   Allergies as of 05/25/2021       Reactions   Penicillins Rash        Medication List        Accurate as of May 25, 2021  4:35 PM. If you have any  questions, ask your nurse or doctor.          citalopram 20 MG tablet Commonly known as: CELEXA Take 20 mg by mouth daily.   fluticasone 50 MCG/ACT nasal spray Commonly known as: FLONASE Place 1 spray into both nostrils 2 (two) times daily as needed for allergies or rhinitis.   hyoscyamine 0.125 MG SL tablet Commonly known as: LEVSIN SL Place 1 tablet (0.125 mg total) under the tongue every 6 (six) hours as needed.   hyoscyamine 0.375 MG 12 hr tablet Commonly known as: Levbid Take 1 tablet (0.375 mg total) by mouth 2 (two) times daily.   levocetirizine 5 MG tablet Commonly known as: Xyzal Take 1 tablet (5 mg total) by mouth every evening.   loperamide 2 MG tablet Commonly known as: Imodium A-D Take 1 tablet (2 mg total) by mouth 4 (four) times daily as needed for diarrhea or loose stools.         Objective:   BP 127/77   Pulse (!) 107   Temp (!) 101 F (38.3 C)   SpO2 97%   Wt Readings from Last 3 Encounters:  12/28/20 176 lb 4 oz (79.9 kg)  12/02/20 183 lb (83 kg)  11/15/20 183 lb (83 kg)    Physical Exam Vitals and nursing note reviewed.  Constitutional:      General: He is not in acute distress.    Appearance: He is well-developed. He is not diaphoretic.  HENT:     Nose: Mucosal edema present.     Right Sinus: Maxillary sinus tenderness present. No frontal sinus tenderness.     Left Sinus: Maxillary sinus tenderness present. No frontal sinus tenderness.     Mouth/Throat:     Pharynx: Uvula midline.     Tonsils: No tonsillar abscesses.  Eyes:     General: No scleral icterus.    Conjunctiva/sclera: Conjunctivae normal.  Neck:     Thyroid: No thyromegaly.  Cardiovascular:     Rate and Rhythm: Normal rate and regular rhythm.     Heart sounds: Normal heart sounds. No murmur heard. Pulmonary:     Effort: Pulmonary effort is normal. No respiratory distress.     Breath sounds: Normal breath sounds. No wheezing, rhonchi or rales.  Chest:     Chest  wall: No tenderness.  Musculoskeletal:        General: Normal range of motion.     Cervical back: Neck supple.  Lymphadenopathy:     Cervical: No cervical adenopathy.  Skin:    General: Skin is warm and dry.     Findings: No rash.  Neurological:     Mental Status: He is alert and oriented to person, place, and time.     Coordination: Coordination normal.  Psychiatric:        Behavior: Behavior normal.      Assessment & Plan:   Problem List Items Addressed This Visit   None Visit Diagnoses     Nasal congestion    -  Primary   Relevant Orders   Novel Coronavirus, NAA ([facility])   Fatigue, unspecified type       Relevant Orders   Novel Coronavirus, NAA ([facility])   Fever, unspecified fever cause       Relevant Orders   Novel Coronavirus, NAA ([facility])       Recommended conservative management, tested positive for COVID at home, will do test here and will get note for work stating needs to be out at least 5 days or until 24 hours fever free quarantine Follow up plan: Return if symptoms worsen or fail to improve.  Counseling provided for all of the vaccine components Orders Placed This Encounter  Procedures   Novel Coronavirus, NAA ([facility])    Arville Care, MD [facility] 05/25/2021, 4:35 PM

## 2021-05-26 ENCOUNTER — Encounter: Payer: Self-pay | Admitting: Family Medicine

## 2021-05-26 LAB — SARS-COV-2, NAA 2 DAY TAT

## 2021-05-26 LAB — NOVEL CORONAVIRUS, NAA: SARS-CoV-2, NAA: DETECTED — AB

## 2021-08-05 ENCOUNTER — Other Ambulatory Visit: Payer: Self-pay | Admitting: Family Medicine

## 2021-08-05 DIAGNOSIS — J302 Other seasonal allergic rhinitis: Secondary | ICD-10-CM

## 2022-04-25 ENCOUNTER — Telehealth: Payer: Self-pay | Admitting: Nurse Practitioner

## 2022-04-25 ENCOUNTER — Ambulatory Visit (INDEPENDENT_AMBULATORY_CARE_PROVIDER_SITE_OTHER): Payer: 59 | Admitting: Nurse Practitioner

## 2022-04-25 ENCOUNTER — Encounter: Payer: Self-pay | Admitting: Nurse Practitioner

## 2022-04-25 VITALS — BP 147/77 | HR 84 | Temp 98.4°F | Ht 72.0 in | Wt 211.0 lb

## 2022-04-25 DIAGNOSIS — F419 Anxiety disorder, unspecified: Secondary | ICD-10-CM | POA: Insufficient documentation

## 2022-04-25 DIAGNOSIS — Z0001 Encounter for general adult medical examination with abnormal findings: Secondary | ICD-10-CM | POA: Diagnosis not present

## 2022-04-25 DIAGNOSIS — F339 Major depressive disorder, recurrent, unspecified: Secondary | ICD-10-CM

## 2022-04-25 DIAGNOSIS — Z0000 Encounter for general adult medical examination without abnormal findings: Secondary | ICD-10-CM | POA: Insufficient documentation

## 2022-04-25 NOTE — Progress Notes (Addendum)
 Established Patient Office Visit  Subjective   Patient ID: Marcus Martinez, male    DOB: 03/22/1991  Age: 31 y.o. MRN: [id number]  Chief Complaint  Patient presents with   Medical Management of Chronic Issues    CPE for work   Annual Exam    HPI  Encounter for general adult medical examination without abnormal findings  Physical: Patient's last physical exam was 1 year ago .  Weight: Appropriate for height (BMI greater than 27%) ;  Blood Pressure: Normal (BP greater than 120/80) ;  Medical History: Patient history reviewed ; Family history reviewed ;  Allergies Reviewed: No change in current allergies ;  Medications Reviewed: Medications reviewed - no changes ;  Lipids: Normal lipid levels ; labs completed results pending Smoking: Life-long-smoker ; (4 packs a day) Physical Activity: Exercises at least 3 times per week ; occasionally Alcohol/Drug Use: Is a non-drinker ; No illicit drug use ;  Patient is not afflicted from Stress Incontinence and Urge Incontinence  Safety: reviewed ; Patient wears a seat belt, has smoke detectors, has carbon monoxide detectors, practices appropriate gun safety, and wears sunscreen with extended sun exposure. Dental Care: annual cleanings, brushes and flosses daily. Ophthalmology/Optometry: Annual visit.  Hearing loss: none Vision impairments: none   Depression, Follow-up  He  was last seen for this 1 years ago. Changes made at last visit include Celexa 20 mg tablet by mouth daily.   He reports good compliance with treatment. He is not having side effects.   He reports good tolerance of treatment. Current symptoms include: depressed mood and fatigue He feels he is Unchanged since last visit.     04/25/2022    3:43 PM 10/25/2020    9:27 AM 06/29/2018    8:58 AM  Depression screen PHQ 2/9  Decreased Interest 3 0 0  Down, Depressed, Hopeless 3 0 0  PHQ - 2 Score 6 0 0  Altered sleeping 3    Tired, decreased energy 0    Change in  appetite 3    Feeling bad or failure about yourself  3    Trouble concentrating 0    Moving slowly or fidgety/restless 2    Suicidal thoughts 0    PHQ-9 Score 17    Difficult doing work/chores Extremely dIfficult      -----------------------------------------------------------------------------------------   Anxiety, Follow-up  He was last seen for anxiety 1 years ago. Changes made at last visit include Celexa 20 mg tablet by mouth daily.   He reports good compliance with treatment. He reports fair tolerance of treatment. He is having side effects. Diarrhea   He feels his anxiety is moderate and Unchanged since last visit.  Symptoms: No chest pain No difficulty concentrating  No dizziness No fatigue  Yes feelings of losing control Yes insomnia  Yes irritable No palpitations  No panic attacks No  racing thoughts  No shortness of breath No sweating  No tremors/shakes    GAD-7 Results    04/25/2022    3:44 PM 01/31/2018    1:55 PM 06/20/2016    2:11 PM  GAD-7 Generalized Anxiety Disorder Screening Tool  1. Feeling Nervous, Anxious, or on Edge 3 2 0  2. Not Being Able to Stop or Control Worrying 3 3 0  3. Worrying Too Much About Different Things 3 3 1  4. Trouble Relaxing 2 2 2  5. Being So Restless it's Hard To Sit Still 3 1 2  6. Becoming Easily Annoyed or Irritable 3   3 0  7. Feeling Afraid As If Something Awful Might Happen 3 0 0  Total GAD-7 Score 20 14 5  Difficulty At Work, Home, or Getting  Along With Others?  Very difficult     PHQ-9 Scores    04/25/2022    3:43 PM 10/25/2020    9:27 AM 06/29/2018    8:58 AM  PHQ9 SCORE ONLY  PHQ-9 Total Score 17 0 0    Patient Active Problem List   Diagnosis Date Noted   HTN (hypertension) 05/24/2022   Schizoaffective disorder, bipolar type (HCC) 05/18/2022   OCD (obsessive compulsive disorder) 05/18/2022   Unspecified mood (affective) disorder (HCC) 05/17/2022   Depression, recurrent (HCC) 04/25/2022   Encounter  for annual physical exam 04/25/2022   Anxiety 04/25/2022   Irritable bowel syndrome with diarrhea 12/28/2020   Diarrhea 10/25/2020   Tonsillar hypertrophy 11/15/2015   Past Medical History:  Diagnosis Date   ADHD (attention deficit hyperactivity disorder)    GAD (generalized anxiety disorder)    Major depressive disorder    Past Surgical History:  Procedure Laterality Date   WISDOM TOOTH EXTRACTION     Social History   Tobacco Use   Smoking status: Every Day    Packs/day: 0.25    Types: Cigarettes   Smokeless tobacco: Never  Vaping Use   Vaping Use: Never used  Substance Use Topics   Alcohol use: Yes    Comment: occasional   Drug use: Not Currently    Types: Marijuana   Social History   Socioeconomic History   Marital status: Single    Spouse name: Not on file   Number of children: Not on file   Years of education: Not on file   Highest education level: Not on file  Occupational History   Occupation: [employer]  Tobacco Use   Smoking status: Every Day    Packs/day: 0.25    Types: Cigarettes   Smokeless tobacco: Never  Vaping Use   Vaping Use: Never used  Substance and Sexual Activity   Alcohol use: Yes    Comment: occasional   Drug use: Not Currently    Types: Marijuana   Sexual activity: Never  Other Topics Concern   Not on file  Social History Narrative   Not on file   Social Determinants of Health   Financial Resource Strain: Low Risk  (11/14/2017)   Overall Financial Resource Strain (CARDIA)    Difficulty of Paying Living Expenses: Not hard at all  Food Insecurity: No Food Insecurity (11/14/2017)   Hunger Vital Sign    Worried About Running Out of Food in the Last Year: Never true    Ran Out of Food in the Last Year: Never true  Transportation Needs: No Transportation Needs (11/14/2017)   PRAPARE - Transportation    Lack of Transportation (Medical): No    Lack of Transportation (Non-Medical): No  Physical Activity: Inactive  (11/14/2017)   Exercise Vital Sign    Days of Exercise per Week: 0 days    Minutes of Exercise per Session: 0 min  Stress: No Stress Concern Present (11/14/2017)   [employer] - Occupational Stress Questionnaire    Feeling of Stress : Not at all  Social Connections: Somewhat Isolated (11/14/2017)   Social Connection and Isolation Panel [NHANES]    Frequency of Communication with Friends and Family: More than three times a week    Frequency of Social Gatherings with Friends and Family: Once a week      Attends Religious Services: More than 4 times per year    Active Member of Clubs or Organizations: No    Attends Club or Organization Meetings: Never    Marital Status: Never married  Intimate Partner Violence: Not At Risk (11/14/2017)   Humiliation, Afraid, Rape, and Kick questionnaire    Fear of Current or Ex-Partner: No    Emotionally Abused: No    Physically Abused: No    Sexually Abused: No   Family Status  Relation Name Status   Mother  Alive   Father  Alive   Neg Hx  (Not Specified)   Family History  Problem Relation Age of Onset   Colon cancer Neg Hx    Esophageal cancer Neg Hx    Rectal cancer Neg Hx    Stomach cancer Neg Hx     Review of Systems  Constitutional: Negative.  Negative for chills and fever.  HENT: Negative.    Eyes: Negative.   Respiratory: Negative.    Cardiovascular: Negative.   Gastrointestinal: Negative.   Genitourinary: Negative.   Skin: Negative.  Negative for itching and rash.  Neurological: Negative.   Endo/Heme/Allergies: Negative.   All other systems reviewed and are negative.     Objective:     BP (!) 147/77   Pulse 84   Temp 98.4 F (36.9 C)   Ht 6' (1.829 m)   Wt 211 lb (95.7 kg)   SpO2 98%   BMI 28.62 kg/m  BP Readings from Last 3 Encounters:  06/16/22 125/89  05/26/22 132/83  05/17/22 (!) 155/94   Wt Readings from Last 3 Encounters:  06/16/22 222 lb 1.6 oz (100.7 kg)  05/17/22 203 lb (92.1  kg)  04/25/22 211 lb (95.7 kg)      Physical Exam Vitals and nursing note reviewed.  Constitutional:      Appearance: Normal appearance.  HENT:     Right Ear: Ear canal and external ear normal.     Left Ear: Ear canal and external ear normal.     Nose: Nose normal. No congestion.     Mouth/Throat:     Mouth: Mucous membranes are moist.     Pharynx: Oropharynx is clear.  Eyes:     Conjunctiva/sclera: Conjunctivae normal.  Cardiovascular:     Rate and Rhythm: Normal rate and regular rhythm.     Pulses: Normal pulses.     Heart sounds: Normal heart sounds.  Pulmonary:     Effort: Pulmonary effort is normal.     Breath sounds: Normal breath sounds.  Abdominal:     General: Bowel sounds are normal.  Musculoskeletal:        General: Normal range of motion.  Skin:    General: Skin is warm.     Findings: No erythema or rash.  Neurological:     General: No focal deficit present.     Mental Status: He is alert and oriented to person, place, and time.  Psychiatric:        Attention and Perception: Attention and perception normal.        Mood and Affect: Mood is anxious and depressed.        Speech: Speech normal.        Behavior: Behavior normal.      Results for orders placed or performed in visit on 04/25/22  CBC with Differential  Result Value Ref Range   WBC 7.1 3.4 - 10.8 x10E3/uL   RBC 5.50 4.14 - 5.80 x10E6/uL   Hemoglobin   16.2 13.0 - 17.7 g/dL   Hematocrit 49.2 37.5 - 51.0 %   MCV 90 79 - 97 fL   MCH 29.5 26.6 - 33.0 pg   MCHC 32.9 31.5 - 35.7 g/dL   RDW 12.8 11.6 - 15.4 %   Platelets 223 150 - 450 x10E3/uL   Neutrophils 63 Not Estab. %   Lymphs 26 Not Estab. %   Monocytes 8 Not Estab. %   Eos 2 Not Estab. %   Basos 1 Not Estab. %   Neutrophils Absolute 4.5 1.4 - 7.0 x10E3/uL   Lymphocytes Absolute 1.8 0.7 - 3.1 x10E3/uL   Monocytes Absolute 0.6 0.1 - 0.9 x10E3/uL   EOS (ABSOLUTE) 0.2 0.0 - 0.4 x10E3/uL   Basophils Absolute 0.1 0.0 - 0.2 x10E3/uL    Immature Granulocytes 0 Not Estab. %   Immature Grans (Abs) 0.0 0.0 - 0.1 x10E3/uL  CMP14+EGFR  Result Value Ref Range   Glucose 94 70 - 99 mg/dL   BUN 12 6 - 20 mg/dL   Creatinine, Ser 0.85 0.76 - 1.27 mg/dL   eGFR 119 >59 mL/min/1.73   BUN/Creatinine Ratio 14 9 - 20   Sodium 137 134 - 144 mmol/L   Potassium 4.1 3.5 - 5.2 mmol/L   Chloride 100 96 - 106 mmol/L   CO2 21 20 - 29 mmol/L   Calcium 9.5 8.7 - 10.2 mg/dL   Total Protein 7.3 6.0 - 8.5 g/dL   Albumin 4.6 4.1 - 5.1 g/dL   Globulin, Total 2.7 1.5 - 4.5 g/dL   Albumin/Globulin Ratio 1.7 1.2 - 2.2   Bilirubin Total 0.4 0.0 - 1.2 mg/dL   Alkaline Phosphatase 95 44 - 121 IU/L   AST 16 0 - 40 IU/L   ALT 17 0 - 44 IU/L  Lipid Panel  Result Value Ref Range   Cholesterol, Total 214 (H) 100 - 199 mg/dL   Triglycerides 355 (H) 0 - 149 mg/dL   HDL 32 (L) >39 mg/dL   VLDL Cholesterol Cal 62 (H) 5 - 40 mg/dL   LDL Chol Calc (NIH) 120 (H) 0 - 99 mg/dL   Chol/HDL Ratio 6.7 (H) 0.0 - 5.0 ratio  TSH  Result Value Ref Range   TSH 1.710 0.450 - 4.500 uIU/mL  Hepatitis C antibody  Result Value Ref Range   Hep C Virus Ab Non Reactive Non Reactive    Last CBC Lab Results  Component Value Date   WBC 7.5 05/16/2022   HGB 16.5 05/16/2022   HCT 47.9 05/16/2022   MCV 88.2 05/16/2022   MCH 30.4 05/16/2022   RDW 13.1 05/16/2022   PLT 225 05/16/2022   Last metabolic panel Lab Results  Component Value Date   GLUCOSE 118 (H) 05/16/2022   NA 141 05/16/2022   K 3.8 05/16/2022   CL 106 05/16/2022   CO2 25 05/16/2022   BUN 8 05/16/2022   CREATININE 0.81 05/16/2022   GFRNONAA >60 05/16/2022   CALCIUM 9.3 05/16/2022   PROT 7.2 05/16/2022   ALBUMIN 3.9 05/16/2022   LABGLOB 2.7 04/25/2022   AGRATIO 1.7 04/25/2022   BILITOT 0.5 05/16/2022   ALKPHOS 77 05/16/2022   AST 26 05/16/2022   ALT 23 05/16/2022   ANIONGAP 10 05/16/2022   Last lipids Lab Results  Component Value Date   CHOL 214 (H) 04/25/2022   HDL 32 (L) 04/25/2022    LDLCALC 120 (H) 04/25/2022   TRIG 355 (H) 04/25/2022   CHOLHDL 6.7 (H) 04/25/2022   Last hemoglobin   A1c Lab Results  Component Value Date   HGBA1C 5.3 05/24/2022   Last thyroid functions Lab Results  Component Value Date   TSH 2.211 05/18/2022   Last vitamin D No results found for: "25OHVITD2", "25OHVITD3", "VD25OH"    The ASCVD Risk score (Arnett DK, et al., 2019) failed to calculate for the following reasons:   The 2019 ASCVD risk score is only valid for ages 23 to 62    Assessment & Plan:   Problem List Items Addressed This Visit       Other   Depression, recurrent ([place])    Ongoing depression patient was last seen about a year ago.  Currently on Celexa 20 mg tablet by mouth daily.  Patient has an upcoming appointment with psychiatry September 27.  He reports diarrhea is a side effect of current medication.  I would like to try a different medication.  Education provided to patient printed and was given.  Follow-up as needed.  Completed PHQ-9.      Encounter for annual physical exam - Primary    Completed physical assessment head to toe assessment.  Education provided on health maintenance and preventative care.  Handouts given to patient.  Labs completed-CBC, CMP, lipid panel, hepatitis C.  Follow-up in 1 year.      Relevant Orders   CBC with Differential (Completed)   CMP14+EGFR (Completed)   Lipid Panel (Completed)   TSH (Completed)   Hepatitis C antibody (Completed)   Anxiety    Patient is reporting worsening anxiety on GAD-7. Patient wants to continue current medication dose because he has an up coming psychiatry appointment September 27 th.  Education provided to patient printed handouts given.        Return in about 1 year (around 04/26/2023) for Annual physical.    Ivy Lynn, NP

## 2022-04-25 NOTE — Telephone Encounter (Signed)
Pt has already ate breakfast and lunch today. Advised to still complete labs today and make the provider aware that he did eat prior to visit. Informed that in the future if he is able he can come in a few days prior to visit if able. Pt started new job and was not able to come prior to this CPE visit.

## 2022-04-25 NOTE — Assessment & Plan Note (Addendum)
Patient is reporting worsening anxiety on GAD-7. Patient wants to continue current medication dose because he has an up coming psychiatry appointment September 27 th.  Education provided to patient printed handouts given.

## 2022-04-25 NOTE — Assessment & Plan Note (Signed)
Completed physical assessment head to toe assessment.  Education provided on health maintenance and preventative care.  Handouts given to patient.  Labs completed-CBC, CMP, lipid panel, hepatitis C.  Follow-up in 1 year.

## 2022-04-25 NOTE — Patient Instructions (Signed)
Health Maintenance, Male Adopting a healthy lifestyle and getting preventive care are important in promoting health and wellness. Ask your health care provider about: The right schedule for you to have regular tests and exams. Things you can do on your own to prevent diseases and keep yourself healthy. What should I know about diet, weight, and exercise? Eat a healthy diet  Eat a diet that includes plenty of vegetables, fruits, low-fat dairy products, and lean protein. Do not eat a lot of foods that are high in solid fats, added sugars, or sodium. Maintain a healthy weight Body mass index (BMI) is a measurement that can be used to identify possible weight problems. It estimates body fat based on height and weight. Your health care provider can help determine your BMI and help you achieve or maintain a healthy weight. Get regular exercise Get regular exercise. This is one of the most important things you can do for your health. Most adults should: Exercise for at least 150 minutes each week. The exercise should increase your heart rate and make you sweat (moderate-intensity exercise). Do strengthening exercises at least twice a week. This is in addition to the moderate-intensity exercise. Spend less time sitting. Even light physical activity can be beneficial. Watch cholesterol and blood lipids Have your blood tested for lipids and cholesterol at 31 years of age, then have this test every 5 years. You may need to have your cholesterol levels checked more often if: Your lipid or cholesterol levels are high. You are older than 31 years of age. You are at high risk for heart disease. What should I know about cancer screening? Many types of cancers can be detected early and may often be prevented. Depending on your health history and family history, you may need to have cancer screening at various ages. This may include screening for: Colorectal cancer. Prostate cancer. Skin cancer. Lung  cancer. What should I know about heart disease, diabetes, and high blood pressure? Blood pressure and heart disease High blood pressure causes heart disease and increases the risk of stroke. This is more likely to develop in people who have high blood pressure readings or are overweight. Talk with your health care provider about your target blood pressure readings. Have your blood pressure checked: Every 3-5 years if you are 18-39 years of age. Every year if you are 40 years old or older. If you are between the ages of 65 and 75 and are a current or former smoker, ask your health care provider if you should have a one-time screening for abdominal aortic aneurysm (AAA). Diabetes Have regular diabetes screenings. This checks your fasting blood sugar level. Have the screening done: Once every three years after age 45 if you are at a normal weight and have a low risk for diabetes. More often and at a younger age if you are overweight or have a high risk for diabetes. What should I know about preventing infection? Hepatitis B If you have a higher risk for hepatitis B, you should be screened for this virus. Talk with your health care provider to find out if you are at risk for hepatitis B infection. Hepatitis C Blood testing is recommended for: Everyone born from 1945 through 1965. Anyone with known risk factors for hepatitis C. Sexually transmitted infections (STIs) You should be screened each year for STIs, including gonorrhea and chlamydia, if: You are sexually active and are younger than 31 years of age. You are older than 31 years of age and your   health care provider tells you that you are at risk for this type of infection. Your sexual activity has changed since you were last screened, and you are at increased risk for chlamydia or gonorrhea. Ask your health care provider if you are at risk. Ask your health care provider about whether you are at high risk for HIV. Your health care provider  may recommend a prescription medicine to help prevent HIV infection. If you choose to take medicine to prevent HIV, you should first get tested for HIV. You should then be tested every 3 months for as long as you are taking the medicine. Follow these instructions at home: Alcohol use Do not drink alcohol if your health care provider tells you not to drink. If you drink alcohol: Limit how much you have to 0-2 drinks a day. Know how much alcohol is in your drink. In the [place] one drink equals one 12 oz bottle of beer (355 mL), one 5 oz glass of wine (148 mL), or one 1 oz glass of hard liquor (44 mL). Lifestyle Do not use any products that contain nicotine or tobacco. These products include cigarettes, chewing tobacco, and vaping devices, such as e-cigarettes. If you need help quitting, ask your health care provider. Do not use street drugs. Do not share needles. Ask your health care provider for help if you need support or information about quitting drugs. General instructions Schedule regular health, dental, and eye exams. Stay current with your vaccines. Tell your health care provider if: You often feel depressed. You have ever been abused or do not feel safe at home. Summary Adopting a healthy lifestyle and getting preventive care are important in promoting health and wellness. Follow your health care provider's instructions about healthy diet, exercising, and getting tested or screened for diseases. Follow your health care provider's instructions on monitoring your cholesterol and blood pressure. This information is not intended to replace advice given to you by your health care provider. Make sure you discuss any questions you have with your health care provider. Document Revised: 01/10/2021 Document Reviewed: 01/10/2021 Elsevier Patient Education  2023 [employer]  

## 2022-04-25 NOTE — Assessment & Plan Note (Signed)
Ongoing depression patient was last seen about a year ago.  Currently on Celexa 20 mg tablet by mouth daily.  Patient has an upcoming appointment with psychiatry September 27.  He reports diarrhea is a side effect of current medication.  I would like to try a different medication.  Education provided to patient printed and was given.  Follow-up as needed.  Completed PHQ-9.

## 2022-04-26 ENCOUNTER — Other Ambulatory Visit: Payer: Self-pay | Admitting: Nurse Practitioner

## 2022-04-26 DIAGNOSIS — E782 Mixed hyperlipidemia: Secondary | ICD-10-CM

## 2022-04-26 LAB — CBC WITH DIFFERENTIAL/PLATELET
Basophils Absolute: 0.1 10*3/uL (ref 0.0–0.2)
Basos: 1 %
EOS (ABSOLUTE): 0.2 10*3/uL (ref 0.0–0.4)
Eos: 2 %
Hematocrit: 49.2 % (ref 37.5–51.0)
Hemoglobin: 16.2 g/dL (ref 13.0–17.7)
Immature Grans (Abs): 0 10*3/uL (ref 0.0–0.1)
Immature Granulocytes: 0 %
Lymphocytes Absolute: 1.8 10*3/uL (ref 0.7–3.1)
Lymphs: 26 %
MCH: 29.5 pg (ref 26.6–33.0)
MCHC: 32.9 g/dL (ref 31.5–35.7)
MCV: 90 fL (ref 79–97)
Monocytes Absolute: 0.6 10*3/uL (ref 0.1–0.9)
Monocytes: 8 %
Neutrophils Absolute: 4.5 10*3/uL (ref 1.4–7.0)
Neutrophils: 63 %
Platelets: 223 10*3/uL (ref 150–450)
RBC: 5.5 x10E6/uL (ref 4.14–5.80)
RDW: 12.8 % (ref 11.6–15.4)
WBC: 7.1 10*3/uL (ref 3.4–10.8)

## 2022-04-26 LAB — CMP14+EGFR
ALT: 17 IU/L (ref 0–44)
AST: 16 IU/L (ref 0–40)
Albumin/Globulin Ratio: 1.7 (ref 1.2–2.2)
Albumin: 4.6 g/dL (ref 4.1–5.1)
Alkaline Phosphatase: 95 IU/L (ref 44–121)
BUN/Creatinine Ratio: 14 (ref 9–20)
BUN: 12 mg/dL (ref 6–20)
Bilirubin Total: 0.4 mg/dL (ref 0.0–1.2)
CO2: 21 mmol/L (ref 20–29)
Calcium: 9.5 mg/dL (ref 8.7–10.2)
Chloride: 100 mmol/L (ref 96–106)
Creatinine, Ser: 0.85 mg/dL (ref 0.76–1.27)
Globulin, Total: 2.7 g/dL (ref 1.5–4.5)
Glucose: 94 mg/dL (ref 70–99)
Potassium: 4.1 mmol/L (ref 3.5–5.2)
Sodium: 137 mmol/L (ref 134–144)
Total Protein: 7.3 g/dL (ref 6.0–8.5)
eGFR: 119 mL/min/{1.73_m2} (ref >59)

## 2022-04-26 LAB — LIPID PANEL
Chol/HDL Ratio: 6.7 ratio — ABNORMAL HIGH (ref 0.0–5.0)
Cholesterol, Total: 214 mg/dL — ABNORMAL HIGH (ref 100–199)
HDL: 32 mg/dL — ABNORMAL LOW (ref >39)
LDL Chol Calc (NIH): 120 mg/dL — ABNORMAL HIGH (ref 0–99)
Triglycerides: 355 mg/dL — ABNORMAL HIGH (ref 0–149)
VLDL Cholesterol Cal: 62 mg/dL — ABNORMAL HIGH (ref 5–40)

## 2022-04-26 LAB — TSH: TSH: 1.71 u[IU]/mL (ref 0.450–4.500)

## 2022-04-26 LAB — HEPATITIS C ANTIBODY: Hep C Virus Ab: NONREACTIVE

## 2022-04-26 MED ORDER — OMEGA-3-ACID ETHYL ESTERS 1 G PO CAPS: 2.0000 g | ORAL_CAPSULE | Freq: Two times a day (BID) | ORAL | 1 refills | Status: DC

## 2022-05-16 ENCOUNTER — Emergency Department (EMERGENCY_DEPARTMENT_HOSPITAL)
Admission: EM | Admit: 2022-05-16 | Discharge: 2022-05-17 | Disposition: A | Discharge status: Other Acute Inpatient Hospital | Payer: 59 | Source: Home / Self Care | Attending: Emergency Medicine | Admitting: Emergency Medicine

## 2022-05-16 ENCOUNTER — Encounter (HOSPITAL_COMMUNITY): Payer: Self-pay

## 2022-05-16 DIAGNOSIS — F39 Unspecified mood [affective] disorder: Secondary | ICD-10-CM | POA: Diagnosis present

## 2022-05-16 DIAGNOSIS — Z79899 Other long term (current) drug therapy: Secondary | ICD-10-CM | POA: Insufficient documentation

## 2022-05-16 DIAGNOSIS — F22 Delusional disorders: Secondary | ICD-10-CM | POA: Insufficient documentation

## 2022-05-16 DIAGNOSIS — F250 Schizoaffective disorder, bipolar type: Secondary | ICD-10-CM | POA: Diagnosis not present

## 2022-05-16 DIAGNOSIS — F32A Depression, unspecified: Secondary | ICD-10-CM | POA: Insufficient documentation

## 2022-05-16 DIAGNOSIS — F309 Manic episode, unspecified: Secondary | ICD-10-CM | POA: Diagnosis not present

## 2022-05-16 DIAGNOSIS — Z20822 Contact with and (suspected) exposure to covid-19: Secondary | ICD-10-CM | POA: Insufficient documentation

## 2022-05-16 DIAGNOSIS — F419 Anxiety disorder, unspecified: Secondary | ICD-10-CM | POA: Insufficient documentation

## 2022-05-16 DIAGNOSIS — F29 Unspecified psychosis not due to a substance or known physiological condition: Secondary | ICD-10-CM | POA: Insufficient documentation

## 2022-05-16 LAB — RAPID URINE DRUG SCREEN, HOSP PERFORMED
Amphetamines: NOT DETECTED
Barbiturates: NOT DETECTED
Benzodiazepines: NOT DETECTED
Cocaine: NOT DETECTED
Opiates: NOT DETECTED
Tetrahydrocannabinol: NOT DETECTED

## 2022-05-16 LAB — ETHANOL: Alcohol, Ethyl (B): <10 mg/dL (ref <10)

## 2022-05-16 LAB — CBC WITH DIFFERENTIAL/PLATELET
Abs Immature Granulocytes: 0.02 10*3/uL (ref 0.00–0.07)
Basophils Absolute: 0 10*3/uL (ref 0.0–0.1)
Basophils Relative: 0 %
Eosinophils Absolute: 0 10*3/uL (ref 0.0–0.5)
Eosinophils Relative: 0 %
HCT: 47.9 % (ref 39.0–52.0)
Hemoglobin: 16.5 g/dL (ref 13.0–17.0)
Immature Granulocytes: 0 %
Lymphocytes Relative: 17 %
Lymphs Abs: 1.3 10*3/uL (ref 0.7–4.0)
MCH: 30.4 pg (ref 26.0–34.0)
MCHC: 34.4 g/dL (ref 30.0–36.0)
MCV: 88.2 fL (ref 80.0–100.0)
Monocytes Absolute: 0.6 10*3/uL (ref 0.1–1.0)
Monocytes Relative: 9 %
Neutro Abs: 5.5 10*3/uL (ref 1.7–7.7)
Neutrophils Relative %: 74 %
Platelets: 225 10*3/uL (ref 150–400)
RBC: 5.43 MIL/uL (ref 4.22–5.81)
RDW: 13.1 % (ref 11.5–15.5)
WBC: 7.5 10*3/uL (ref 4.0–10.5)
nRBC: 0 % (ref 0.0–0.2)

## 2022-05-16 LAB — COMPREHENSIVE METABOLIC PANEL
ALT: 23 U/L (ref 0–44)
AST: 26 U/L (ref 15–41)
Albumin: 3.9 g/dL (ref 3.5–5.0)
Alkaline Phosphatase: 77 U/L (ref 38–126)
Anion gap: 10 (ref 5–15)
BUN: 8 mg/dL (ref 6–20)
CO2: 25 mmol/L (ref 22–32)
Calcium: 9.3 mg/dL (ref 8.9–10.3)
Chloride: 106 mmol/L (ref 98–111)
Creatinine, Ser: 0.81 mg/dL (ref 0.61–1.24)
GFR, Estimated: >60 mL/min (ref >60)
Glucose, Bld: 118 mg/dL — ABNORMAL HIGH (ref 70–99)
Potassium: 3.8 mmol/L (ref 3.5–5.1)
Sodium: 141 mmol/L (ref 135–145)
Total Bilirubin: 0.5 mg/dL (ref 0.3–1.2)
Total Protein: 7.2 g/dL (ref 6.5–8.1)

## 2022-05-16 MED ORDER — LORAZEPAM 0.5 MG PO TABS
0.5000 mg | ORAL_TABLET | Freq: Once | ORAL | Status: AC
  Administered 2022-05-17: 0.5 mg via ORAL
  Filled 2022-05-16: qty 1

## 2022-05-16 NOTE — ED Notes (Signed)
Patient's belongings, a red t shirt, blue jeans, brown belt, red and black flip flops, a brown wallet, a black wallet, and a cell phone placed in locker 2.

## 2022-05-16 NOTE — ED Notes (Signed)
Pt's belongings placed in locker 2. 
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
## 2022-05-16 NOTE — ED Notes (Signed)
TTS at bedside. 

## 2022-05-16 NOTE — BH Assessment (Signed)
Pt to be assessed once EDP note is completed.    Vickie Melnik D Raygen Dahm, MS, LCMHC, CRC Triage Specialist [phone]  

## 2022-05-16 NOTE — ED Triage Notes (Signed)
Pt tearful in triage. Pt came in with help for anxiety medication. Started new medication 1 week ago and it has not helped per pt. Pt reports "loose stools have increased, I am more manic, and I can not focus at work, I keep thinking the government is taking over."

## 2022-05-16 NOTE — ED Provider Triage Note (Signed)
Emergency Medicine Provider Triage Evaluation Note  Darron Stuck , a 31 y.o. male  was evaluated in triage.  Pt complains of need help with anxiety, starting a new medication which is not helping.  Having troubles with his mother.  Review of Systems  Positive: Anxiety Negative: Suicidal ideation, homicidal ideation dental pain, chest pain  Physical Exam  BP (!) 141/97   Pulse (!) 122   Temp 98.7 F (37.1 C) (Oral)   Resp 20   SpO2 98%  Gen:   Awake, no distress   Resp:  Normal effort  MSK:   Moves extremities without difficulty  Other:    Medical Decision Making  Medically screening exam initiated at 6:56 PM.  Appropriate orders placed.  Sayf Kerner was informed that the remainder of the evaluation will be completed by another provider, this initial triage assessment does not replace that evaluation, and the importance of remaining in the ED until their evaluation is complete.     Jeannie Fend, PA-C 05/16/22 1857

## 2022-05-17 ENCOUNTER — Encounter (HOSPITAL_COMMUNITY): Payer: Self-pay | Admitting: Registered Nurse

## 2022-05-17 ENCOUNTER — Other Ambulatory Visit: Payer: Self-pay

## 2022-05-17 ENCOUNTER — Inpatient Hospital Stay (HOSPITAL_COMMUNITY)
Admission: AD | Admit: 2022-05-17 | Discharge: 2022-05-26 | DRG: 885 | Disposition: A | Discharge status: Home / Self Care | Payer: 59 | Source: Intra-hospital | Attending: Psychiatry | Admitting: Psychiatry

## 2022-05-17 DIAGNOSIS — F410 Panic disorder [episodic paroxysmal anxiety] without agoraphobia: Secondary | ICD-10-CM | POA: Diagnosis present

## 2022-05-17 DIAGNOSIS — Z818 Family history of other mental and behavioral disorders: Secondary | ICD-10-CM

## 2022-05-17 DIAGNOSIS — F600 Paranoid personality disorder: Secondary | ICD-10-CM | POA: Diagnosis present

## 2022-05-17 DIAGNOSIS — Z20822 Contact with and (suspected) exposure to covid-19: Secondary | ICD-10-CM | POA: Diagnosis present

## 2022-05-17 DIAGNOSIS — F429 Obsessive-compulsive disorder, unspecified: Secondary | ICD-10-CM | POA: Diagnosis present

## 2022-05-17 DIAGNOSIS — F22 Delusional disorders: Secondary | ICD-10-CM

## 2022-05-17 DIAGNOSIS — Z79899 Other long term (current) drug therapy: Secondary | ICD-10-CM | POA: Diagnosis not present

## 2022-05-17 DIAGNOSIS — F419 Anxiety disorder, unspecified: Secondary | ICD-10-CM

## 2022-05-17 DIAGNOSIS — I10 Essential (primary) hypertension: Secondary | ICD-10-CM | POA: Diagnosis present

## 2022-05-17 DIAGNOSIS — G4700 Insomnia, unspecified: Secondary | ICD-10-CM | POA: Diagnosis present

## 2022-05-17 DIAGNOSIS — Z808 Family history of malignant neoplasm of other organs or systems: Secondary | ICD-10-CM

## 2022-05-17 DIAGNOSIS — F39 Unspecified mood [affective] disorder: Secondary | ICD-10-CM | POA: Diagnosis not present

## 2022-05-17 DIAGNOSIS — E538 Deficiency of other specified B group vitamins: Secondary | ICD-10-CM | POA: Diagnosis present

## 2022-05-17 DIAGNOSIS — F1290 Cannabis use, unspecified, uncomplicated: Secondary | ICD-10-CM | POA: Diagnosis present

## 2022-05-17 DIAGNOSIS — F32A Depression, unspecified: Secondary | ICD-10-CM

## 2022-05-17 DIAGNOSIS — F909 Attention-deficit hyperactivity disorder, unspecified type: Secondary | ICD-10-CM | POA: Diagnosis present

## 2022-05-17 DIAGNOSIS — R739 Hyperglycemia, unspecified: Secondary | ICD-10-CM | POA: Diagnosis present

## 2022-05-17 DIAGNOSIS — G4730 Sleep apnea, unspecified: Secondary | ICD-10-CM | POA: Diagnosis present

## 2022-05-17 DIAGNOSIS — R45851 Suicidal ideations: Secondary | ICD-10-CM | POA: Diagnosis present

## 2022-05-17 DIAGNOSIS — Z23 Encounter for immunization: Secondary | ICD-10-CM

## 2022-05-17 DIAGNOSIS — F1010 Alcohol abuse, uncomplicated: Secondary | ICD-10-CM | POA: Diagnosis present

## 2022-05-17 DIAGNOSIS — F250 Schizoaffective disorder, bipolar type: Principal | ICD-10-CM | POA: Diagnosis present

## 2022-05-17 DIAGNOSIS — F411 Generalized anxiety disorder: Secondary | ICD-10-CM | POA: Diagnosis present

## 2022-05-17 DIAGNOSIS — F17210 Nicotine dependence, cigarettes, uncomplicated: Secondary | ICD-10-CM | POA: Diagnosis present

## 2022-05-17 DIAGNOSIS — F309 Manic episode, unspecified: Secondary | ICD-10-CM | POA: Diagnosis present

## 2022-05-17 LAB — SARS CORONAVIRUS 2 BY RT PCR: SARS Coronavirus 2 by RT PCR: NEGATIVE

## 2022-05-17 MED ORDER — HYDROXYZINE HCL 25 MG PO TABS
25.0000 mg | ORAL_TABLET | Freq: Three times a day (TID) | ORAL | Status: DC
  Administered 2022-05-18 – 2022-05-26 (×24): 25 mg via ORAL
  Filled 2022-05-17 (×30): qty 1

## 2022-05-17 MED ORDER — CITALOPRAM HYDROBROMIDE 10 MG PO TABS
20.0000 mg | ORAL_TABLET | Freq: Every day | ORAL | Status: DC
  Administered 2022-05-17: 20 mg via ORAL
  Filled 2022-05-17: qty 2

## 2022-05-17 MED ORDER — ACETAMINOPHEN 325 MG PO TABS
650.0000 mg | ORAL_TABLET | Freq: Four times a day (QID) | ORAL | Status: DC | PRN
  Administered 2022-05-19 – 2022-05-21 (×2): 650 mg via ORAL
  Filled 2022-05-17 (×2): qty 2

## 2022-05-17 MED ORDER — OLANZAPINE 5 MG PO TBDP: 5.0000 mg | ORAL_TABLET | Freq: Every day | ORAL | Status: DC

## 2022-05-17 MED ORDER — NICOTINE POLACRILEX 2 MG MT GUM
2.0000 mg | CHEWING_GUM | OROMUCOSAL | Status: DC | PRN
  Filled 2022-05-17: qty 1

## 2022-05-17 MED ORDER — ALUM & MAG HYDROXIDE-SIMETH 200-200-20 MG/5ML PO SUSP
30.0000 mL | ORAL | Status: DC | PRN
  Administered 2022-05-24 – 2022-05-25 (×2): 30 mL via ORAL
  Filled 2022-05-17 (×2): qty 30

## 2022-05-17 MED ORDER — HYDROXYZINE HCL 25 MG PO TABS
25.0000 mg | ORAL_TABLET | Freq: Three times a day (TID) | ORAL | Status: DC
  Administered 2022-05-17 (×2): 25 mg via ORAL
  Filled 2022-05-17 (×2): qty 1

## 2022-05-17 MED ORDER — CITALOPRAM HYDROBROMIDE 20 MG PO TABS
20.0000 mg | ORAL_TABLET | Freq: Every day | ORAL | Status: DC
  Administered 2022-05-18: 20 mg via ORAL
  Filled 2022-05-17 (×3): qty 1

## 2022-05-17 MED ORDER — HYOSCYAMINE SULFATE ER 0.375 MG PO TB12: 0.3750 mg | ORAL_TABLET | Freq: Two times a day (BID) | ORAL | Status: DC

## 2022-05-17 MED ORDER — OLANZAPINE 5 MG PO TBDP
5.0000 mg | ORAL_TABLET | Freq: Every day | ORAL | Status: DC
  Administered 2022-05-17: 5 mg via ORAL
  Filled 2022-05-17 (×4): qty 1

## 2022-05-17 MED ORDER — INFLUENZA VAC SPLIT QUAD 0.5 ML IM SUSY
0.5000 mL | PREFILLED_SYRINGE | INTRAMUSCULAR | Status: AC
  Administered 2022-05-18: 0.5 mL via INTRAMUSCULAR
  Filled 2022-05-17: qty 0.5

## 2022-05-17 MED ORDER — MAGNESIUM HYDROXIDE 400 MG/5ML PO SUSP
30.0000 mL | Freq: Every day | ORAL | Status: DC | PRN
  Filled 2022-05-17: qty 30

## 2022-05-17 NOTE — ED Notes (Signed)
Pt made phone call to mother asking mother to visit this evening at 1730. Rules of visitation explained to pt and relayed to family.

## 2022-05-17 NOTE — ED Provider Notes (Signed)
Emergency Medicine Observation Re-evaluation Note  Marcus Martinez is a 31 y.o. male, seen on rounds today.  Pt initially presented to the ED for complaints of Anxiety and Suicidal Currently, the patient is eating breakfast.  Physical Exam  BP 135/89 (BP Location: Right Arm)   Pulse 81   Temp 98 F (36.7 C) (Oral)   Resp 20   SpO2 97%  Physical Exam General: No distress Cardiac: Well-perfused Lungs: No respiratory distress Psych: Calm  ED Course / MDM  EKG:   I have reviewed the labs performed to date as well as medications administered while in observation.  Recent changes in the last 24 hours include TTS evaluation .  Plan  Current plan is for psychiatry reevaluation this morning.    Glynn Octave, MD 05/17/22 [phone]
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
## 2022-05-17 NOTE — ED Notes (Signed)
Family here for visit
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
## 2022-05-17 NOTE — ED Provider Notes (Signed)
Pt with feelings of anxiety and depression, with intermittent SI.   Vitals normal, covid neg. Pt alet, content, nad.  Pt accepted to [facility], Dr Sherron Flemings.  Pt appears stable for admit to [facility]/transport/transfer.     Cathren Laine, MD 05/17/22 [phone]

## 2022-05-17 NOTE — ED Notes (Signed)
Safe transport here for pt. Informed family members that patient leaving. Family left in tears and pt tearful after visit, states he finally got out exactly how he feels and is hoping for family coping therapy at [facility]. Pt ambulated with this nurse to amb bay entrance with steady gait in stable condition.  Belongings and paperwork given to safe transport
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
## 2022-05-17 NOTE — Progress Notes (Signed)
Pt was accepted to [facility] Today 05/17/22; Bed Assignment 405-1.  Pt meets inpatient criteria per Assunta Found, NP  Attending Physician will be Dr. Phineas Inches   Report can be called to: - Child and Adolescence unit: [phone] -Adult unit: [phone]  Pt can arrive after PENDING Covid-19 and Voluntary consent  Please fax vol consent to [phone]  Care Team notified: [facility] Rona Ravens, RN, Assunta Found, NP, Gretta Cool. MD   Kelton Pillar, LCSWA 05/17/2022 @ 4:12 PM

## 2022-05-17 NOTE — ED Notes (Signed)
Pt still appears paranoid, often looking over shoulder and wary of what the staff around him are doing. Verbal reassurance provided and explanation of all activities and interventions given. Pt denies SI/HI but does endorse hearing voices telling him he is worthless and should die. Pt briefly tearful during assessment. Pt states he is still hungry and [place] sandwich given. No other needs at this time

## 2022-05-17 NOTE — Progress Notes (Signed)
Marcus Martinez came to NA wrap up group. Marcus Martinez interrupted the speaker as she was talking. The speaker from NA asked if he can wait before he asked questions. The speaker said this is the beginning and introduction to NA group. Marcus Martinez was reading paperwork from NA wrap up group Marcus Martinez interrupted a second time. Marcus Martinez stopped reading and announced Marcus Martinez is a trigger for him. Writer asked Marcus Martinez he will need to wait until NA group is near end before he can ask questions. Writer explain to Marcus Martinez he cannot interrupt the NA wrap up group. Marcus Martinez said he will leave the NA wrap up group. RN will be notify

## 2022-05-17 NOTE — ED Notes (Signed)
Report called to [place] at [facility]. Awaiting COVID result and will set up transport if that results negative
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
## 2022-05-17 NOTE — ED Notes (Signed)
Review of Chart/discussion with RN: Patient status is voluntary

## 2022-05-18 DIAGNOSIS — F250 Schizoaffective disorder, bipolar type: Principal | ICD-10-CM

## 2022-05-18 DIAGNOSIS — F429 Obsessive-compulsive disorder, unspecified: Secondary | ICD-10-CM

## 2022-05-18 DIAGNOSIS — F309 Manic episode, unspecified: Secondary | ICD-10-CM

## 2022-05-18 LAB — URINALYSIS, COMPLETE (UACMP) WITH MICROSCOPIC
Bacteria, UA: NONE SEEN
Bilirubin Urine: NEGATIVE
Glucose, UA: NEGATIVE mg/dL
Hgb urine dipstick: NEGATIVE
Ketones, ur: NEGATIVE mg/dL
Leukocytes,Ua: NEGATIVE
Nitrite: NEGATIVE
Protein, ur: NEGATIVE mg/dL
Specific Gravity, Urine: 1.02 (ref 1.005–1.030)
pH: 5 (ref 5.0–8.0)

## 2022-05-18 LAB — VITAMIN B12: Vitamin B-12: 176 pg/mL — ABNORMAL LOW (ref 180–914)

## 2022-05-18 LAB — TSH: TSH: 2.211 u[IU]/mL (ref 0.350–4.500)

## 2022-05-18 LAB — SEDIMENTATION RATE: Sed Rate: 0 mm/hr (ref 0–16)

## 2022-05-18 MED ORDER — NICOTINE 21 MG/24HR TD PT24
21.0000 mg | MEDICATED_PATCH | Freq: Every day | TRANSDERMAL | Status: DC
  Administered 2022-05-20 – 2022-05-25 (×6): 21 mg via TRANSDERMAL
  Filled 2022-05-18 (×11): qty 1

## 2022-05-18 MED ORDER — ZIPRASIDONE MESYLATE 20 MG IM SOLR: 20.0000 mg | Freq: Three times a day (TID) | INTRAMUSCULAR | Status: DC | PRN

## 2022-05-18 MED ORDER — METFORMIN HCL ER 500 MG PO TB24
500.0000 mg | ORAL_TABLET | Freq: Every day | ORAL | Status: DC
  Administered 2022-05-19 – 2022-05-22 (×4): 500 mg via ORAL
  Filled 2022-05-18 (×7): qty 1

## 2022-05-18 MED ORDER — AMLODIPINE BESYLATE 5 MG PO TABS
5.0000 mg | ORAL_TABLET | Freq: Every day | ORAL | Status: DC
  Administered 2022-05-18 – 2022-05-26 (×9): 5 mg via ORAL
  Filled 2022-05-18 (×12): qty 1

## 2022-05-18 MED ORDER — OLANZAPINE 5 MG PO TBDP
5.0000 mg | ORAL_TABLET | Freq: Three times a day (TID) | ORAL | Status: DC | PRN
  Administered 2022-05-25: 5 mg via ORAL
  Filled 2022-05-18: qty 1

## 2022-05-18 MED ORDER — TRAZODONE HCL 50 MG PO TABS
50.0000 mg | ORAL_TABLET | Freq: Every evening | ORAL | Status: DC | PRN
  Administered 2022-05-18 – 2022-05-25 (×4): 50 mg via ORAL
  Filled 2022-05-18 (×4): qty 1

## 2022-05-18 MED ORDER — LORAZEPAM 1 MG PO TABS: 1.0000 mg | ORAL_TABLET | Freq: Four times a day (QID) | ORAL | Status: DC | PRN

## 2022-05-18 MED ORDER — OLANZAPINE 10 MG PO TBDP
10.0000 mg | ORAL_TABLET | Freq: Every day | ORAL | Status: DC
  Administered 2022-05-18 – 2022-05-20 (×3): 10 mg via ORAL
  Filled 2022-05-18 (×6): qty 1
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
## 2022-05-18 NOTE — Progress Notes (Signed)
Date:  05/18/2022 Time:  9:00 AM   Group Topic/Focus:    Goals Group:   The focus of this group is to help patients establish daily goals to achieve during treatment and discuss how the patient can incorporate goal setting into their daily lives to aide in recovery.   Orientation:   The focus of this group is to educate the patient on the purpose and policies of crisis stabilization and provide a format to answer questions about their admission.  The group details unit policies and expectations of patients while admitted.    Participation Level:  Did not attend   Participation Quality:  Did not attend   Affect:  Did not attend  Cognitive:  Did not attend   Insight: Did not attend   Engagement in Group:  Did not attend   Modes of Intervention:  Did not attend   Additional Comments:  N/A   Manon Banbury  RN 05/18/2022 9:30AM         
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
## 2022-05-19 ENCOUNTER — Encounter (HOSPITAL_COMMUNITY): Payer: Self-pay

## 2022-05-19 ENCOUNTER — Inpatient Hospital Stay (HOSPITAL_COMMUNITY)
Admit: 2022-05-19 | Discharge: 2022-05-19 | Disposition: A | Discharge status: Home / Self Care | Payer: 59 | Attending: Psychiatry | Admitting: Psychiatry

## 2022-05-19 LAB — CERULOPLASMIN: Ceruloplasmin: 19.4 mg/dL (ref 16.0–31.0)

## 2022-05-19 LAB — ANA W/REFLEX IF POSITIVE

## 2022-05-19 LAB — RPR: RPR Ser Ql: NONREACTIVE

## 2022-05-19 LAB — HIV ANTIBODY (ROUTINE TESTING W REFLEX): HIV Screen 4th Generation wRfx: NONREACTIVE

## 2022-05-19 NOTE — BH IP Treatment Plan (Signed)
Interdisciplinary Treatment and Diagnostic Plan Update  05/19/2022 Time of Session: 9:35am  Marcus Martinez MRN: [id number]  Principal Diagnosis: Schizoaffective disorder, bipolar type ([place])  Secondary Diagnoses: Principal Problem:   Schizoaffective disorder, bipolar type ([place]) Active Problems:   Anxiety   Mood disorder of manic type ([facility])   OCD (obsessive compulsive disorder)   Current Medications:  Current Facility-Administered Medications  Medication Dose Route Frequency Provider Last Rate Last Admin   acetaminophen (TYLENOL) tablet 650 mg  650 mg Oral Q6H PRN Rankin, Shuvon B, NP       alum & mag hydroxide-simeth (MAALOX/MYLANTA) 200-200-20 MG/5ML suspension 30 mL  30 mL Oral Q4H PRN Rankin, Shuvon B, NP       amLODipine (NORVASC) tablet 5 mg  5 mg Oral Daily Massengill, Nathan, MD   5 mg at 05/19/22 8756   hydrOXYzine (ATARAX) tablet 25 mg  25 mg Oral TID Rankin, Shuvon B, NP   25 mg at 05/19/22 0909   OLANZapine zydis (ZYPREXA) disintegrating tablet 5 mg  5 mg Oral Q8H PRN Massengill, Ovid Curd, MD       And   LORazepam (ATIVAN) tablet 1 mg  1 mg Oral Q6H PRN Massengill, Ovid Curd, MD       And   ziprasidone (GEODON) injection 20 mg  20 mg Intramuscular Q8H PRN Massengill, Nathan, MD       magnesium hydroxide (MILK OF MAGNESIA) suspension 30 mL  30 mL Oral Daily PRN Rankin, Shuvon B, NP       metFORMIN (GLUCOPHAGE-XR) 24 hr tablet 500 mg  500 mg Oral Q breakfast Massengill, Nathan, MD   500 mg at 05/19/22 4332   nicotine (NICODERM CQ - dosed in mg/24 hours) patch 21 mg  21 mg Transdermal Daily Leevy-Johnson, Brooke A, NP       nicotine polacrilex (NICORETTE) gum 2 mg  2 mg Oral PRN Onuoha, Chinwendu V, NP       OLANZapine zydis (ZYPREXA) disintegrating tablet 10 mg  10 mg Oral QHS Massengill, Ovid Curd, MD   10 mg at 05/18/22 2116   traZODone (DESYREL) tablet 50 mg  50 mg Oral QHS PRN Janine Limbo, MD   50 mg at 05/18/22 2116   PTA Medications: Medications Prior to Admission   Medication Sig Dispense Refill Last Dose   citalopram (CELEXA) 20 MG tablet Take 20 mg by mouth daily.      fluticasone (FLONASE) 50 MCG/ACT nasal spray Place 1 spray into both nostrils 2 (two) times daily as needed for allergies or rhinitis. (Patient not taking: Reported on 05/16/2022) 16 g 6    hydrOXYzine (ATARAX) 25 MG tablet Take 25 mg by mouth 3 (three) times daily.      hyoscyamine (LEVBID) 0.375 MG 12 hr tablet Take 1 tablet (0.375 mg total) by mouth 2 (two) times daily. (Patient not taking: Reported on 05/16/2022) 180 tablet 4    hyoscyamine (LEVSIN SL) 0.125 MG SL tablet Place 1 tablet (0.125 mg total) under the tongue every 6 (six) hours as needed. (Patient not taking: Reported on 05/16/2022) 30 tablet 2    levocetirizine (XYZAL) 5 MG tablet TAKE 1 TABLET BY MOUTH EVERY EVENING (Patient taking differently: Take 5 mg by mouth every evening.) 90 tablet 1    loperamide (IMODIUM A-D) 2 MG tablet Take 1 tablet (2 mg total) by mouth 4 (four) times daily as needed for diarrhea or loose stools. (Patient not taking: Reported on 05/16/2022) 30 tablet 0    omega-3 acid ethyl esters (LOVAZA) 1  g capsule Take 2 capsules (2 g total) by mouth 2 (two) times daily. (Patient not taking: Reported on 05/16/2022) 90 capsule 1     Patient Stressors:    Patient Strengths:    Treatment Modalities: Medication Management, Group therapy, Case management,  1 to 1 session with clinician, Psychoeducation, Recreational therapy.   Physician Treatment Plan for Primary Diagnosis: Schizoaffective disorder, bipolar type ([employer]) Long Term Goal(s): Improvement in symptoms so as ready for discharge   Short Term Goals: Ability to identify changes in lifestyle to reduce recurrence of condition will improve Ability to verbalize feelings will improve Ability to disclose and discuss suicidal ideas Ability to demonstrate self-control will improve Ability to identify and develop effective coping behaviors will improve Ability to  maintain clinical measurements within normal limits will improve Compliance with prescribed medications will improve  Medication Management: Evaluate patient's response, side effects, and tolerance of medication regimen.  Therapeutic Interventions: 1 to 1 sessions, Unit Group sessions and Medication administration.  Evaluation of Outcomes: Not Met  Physician Treatment Plan for Secondary Diagnosis: Principal Problem:   Schizoaffective disorder, bipolar type ([place]) Active Problems:   Anxiety   Mood disorder of manic type ([facility])   OCD (obsessive compulsive disorder)  Long Term Goal(s): Improvement in symptoms so as ready for discharge   Short Term Goals: Ability to identify changes in lifestyle to reduce recurrence of condition will improve Ability to verbalize feelings will improve Ability to disclose and discuss suicidal ideas Ability to demonstrate self-control will improve Ability to identify and develop effective coping behaviors will improve Ability to maintain clinical measurements within normal limits will improve Compliance with prescribed medications will improve     Medication Management: Evaluate patient's response, side effects, and tolerance of medication regimen.  Therapeutic Interventions: 1 to 1 sessions, Unit Group sessions and Medication administration.  Evaluation of Outcomes: Not Met   RN Treatment Plan for Primary Diagnosis: Schizoaffective disorder, bipolar type ([place]) Long Term Goal(s): Knowledge of disease and therapeutic regimen to maintain health will improve  Short Term Goals: Ability to remain free from injury will improve, Ability to participate in decision making will improve, Ability to verbalize feelings will improve, Ability to disclose and discuss suicidal ideas, and Ability to identify and develop effective coping behaviors will improve  Medication Management: RN will administer medications as ordered by provider, will assess and evaluate patient's  response and provide education to patient for prescribed medication. RN will report any adverse and/or side effects to prescribing provider.  Therapeutic Interventions: 1 on 1 counseling sessions, Psychoeducation, Medication administration, Evaluate responses to treatment, Monitor vital signs and CBGs as ordered, Perform/monitor CIWA, COWS, AIMS and Fall Risk screenings as ordered, Perform wound care treatments as ordered.  Evaluation of Outcomes: Not Met   LCSW Treatment Plan for Primary Diagnosis: Schizoaffective disorder, bipolar type ([place]) Long Term Goal(s): Safe transition to appropriate next level of care at discharge, Engage patient in therapeutic group addressing interpersonal concerns.  Short Term Goals: Engage patient in aftercare planning with referrals and resources, Increase social support, Increase emotional regulation, Facilitate acceptance of mental health diagnosis and concerns, Identify triggers associated with mental health/substance abuse issues, and Increase skills for wellness and recovery  Therapeutic Interventions: Assess for all discharge needs, 1 to 1 time with Social worker, Explore available resources and support systems, Assess for adequacy in community support network, Educate family and significant other(s) on suicide prevention, Complete Psychosocial Assessment, Interpersonal group therapy.  Evaluation of Outcomes: Not Met   Progress in  Treatment: Attending groups: Yes. Participating in groups: Yes. Taking medication as prescribed: Yes. Toleration medication: Yes. Family/Significant other contact made: Yes, individual(s) contacted:  Step-father and step-sister  Patient understands diagnosis: No. Discussing patient identified problems/goals with staff: Yes. Medical problems stabilized or resolved: Yes. Denies suicidal/homicidal ideation: Yes. Issues/concerns per patient self-inventory: No.   New problem(s) identified: No, Describe:  None   New Short  Term/Long Term Goal(s): medication stabilization, elimination of SI thoughts, development of comprehensive mental wellness plan.   Patient Goals: "To get better sleep"   Discharge Plan or Barriers: Patient recently admitted. CSW will continue to follow and assess for appropriate referrals and possible discharge planning.   Reason for Continuation of Hospitalization: Anxiety Delusions  Depression Medication stabilization Suicidal ideation  Estimated Length of Stay: 3 to 7 days   Last [place] Suicide Severity Risk Score: Flowsheet Row Admission (Current) from 05/17/2022 in [facility] ED from 05/16/2022 in [facility] No Risk No Risk       Last PHQ 2/9 Scores:    04/25/2022    3:43 PM 10/25/2020    9:27 AM 06/29/2018    8:58 AM  Depression screen PHQ 2/9  Decreased Interest 3 0 0  Down, Depressed, Hopeless 3 0 0  PHQ - 2 Score 6 0 0  Altered sleeping 3    Tired, decreased energy 0    Change in appetite 3    Feeling bad or failure about yourself  3    Trouble concentrating 0    Moving slowly or fidgety/restless 2    Suicidal thoughts 0    PHQ-9 Score 17    Difficult doing work/chores Extremely dIfficult      Scribe for Treatment Team: Carney Harder 05/19/2022 12:43 PM

## 2022-05-19 NOTE — Group Note (Signed)
Recreation Therapy Group Note   Group Topic:Leisure Education  Group Date: 05/19/2022 Start Time: 0930 End Time: 1000 Facilitators: Caroll Rancher, LRT,CTRS Location: [address] Dayroom   Goal Area(s) Addresses:  Patient will successfully identify positive leisure and recreation activities.  Patient will acknowledge benefits of participation in healthy leisure activities post discharge.  Patient will actively work with peers toward a shared goal.   Group Description: Pictionary. In groups of 5-7, patients took turns trying to guess the picture being drawn on the board by their teammate.  If the team guessed the correct answer, they won a point.  If the team guessed wrong, the other team got a chance to steal the point. After several rounds of game play, the team with the most points were declared winners. Post-activity discussion reviewed benefits of positive recreation outlets: reducing stress, improving coping mechanisms, increasing self-esteem, and building larger support systems.   Affect/Mood: N/A   Participation Level: Did not attend    Clinical Observations/Individualized Feedback:     Plan: Continue to engage patient in RT group sessions 2-3x/week.   Caroll Rancher, Antonietta Jewel 05/19/2022 12:38 PM

## 2022-05-19 NOTE — Progress Notes (Signed)
[facility] MD Progress Note  05/19/2022 2:20 PM Mitchael Luckey  MRN:  [id number]  Subjective: Marcus Martinez states, "I feel exhilarated for being around people that has similar problems like I do.  And the medication prescribed is good for me."  Brief history:  Marcus Martinez is a 31 year old with a past psychiatric history major depressive disorder, anxiety, unspecified mood disorder, ADHD who presented to [facility] voluntarily seeking help for anxiety medication. Patient reported starting new medication x1 week reporting increased mania, inability to focus, and paranoia that the government is taking over. Per chart review, pt presented to [facility] 05/12/22 reporting adverse reaction to Citalopram including suicidal ideations, diarrhea; denied any active SI on assessment; pt was discharged and told to discontinue Citalopram, and start Azithromycine 250 mg (Z-Pak) for sore throat/strep throat. Seen 04/25/22 at PCP where his GAD was 20, PHQ-9 was 17; given handouts, and told to follow up with psychiatry at his 05/31/22 appointment.    Yesterday's psychiatric team recommendation: -Increase Olanzapine Zydis 10 mg daily at bedtime: (Educated on rationales, benefits and possible side effects of this medication including weight gan & metabolic syndrome and verbalizes understanding).  -Discontinue Celexa 20 mg daily  05/18/22 -Hydroxyzine (Atarax) 25 mg TID for anxiety -Trazodone 50 mg tablets p.o. as needed at bedtime for sleep -Agitation protocol (see MAR)  Today's assessment: On assessment today, patient is seen and examined on [address]. He appears calm and participating in the exam.  Chart reviewed and findings shared with the treatment team and consult with Dr. Mamie Levers.  Alert and oriented x4 to person, time, place, and situation.  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  I realize I could lose my job doing that"  Speech is fluent and clear, and able to maintain good eye contact with the provider throughout the encounter.  Presents with euthymic mood and affect is congruent.  Reports less depressing mood, and commented that the environment is conducive for him to heal.  Reports less anxiety and said he is getting along with fellow patients, however, missed to attend therapeutic milieu this morning, stating no one woke him up.  Encouraged to attend group activities as needed.  Endorsed taking medication as prescribed, and denies any somatic symptoms.  When asked about sleep apnea, patient states it has been bothering him at night for the past year and a half.  Reports that his PCP knew about it and will follow-up with sleep study upon discharge.  Patient endorses very good appetite, endorses sleeping over 7 hours last night.  Denies SI, HI or AVH.  Paranoid ideation observed when patient states, "I feel like the [place] flies are following me and tried to penetrate into my body. Also, I had to buy a new safe because the Alexa will not open the safe that I had.  Made patient aware that Alexa is limited in some duty performance.  Occasional dry cough noted while in the room, encouraged patient to call for as needed medication if it continues.  No recent labs to report except vitamin B12 level low at 176, (normal 182 to 914).  Principal Problem: Schizoaffective disorder, bipolar type ([place])  Diagnosis: Principal Problem:   Schizoaffective disorder, bipolar type ([place]) Active Problems:   Anxiety   Mood disorder of manic type (HCC)   OCD (obsessive compulsive  disorder)  Total Time spent with patient: 45 minutes  Past Psychiatric History: major depressive disorder, anxiety    Past Medical History:  Past Medical History:  Diagnosis Date   ADHD (attention deficit hyperactivity disorder)    GAD (generalized anxiety disorder)    Major depressive  disorder     Past Surgical History:  Procedure Laterality Date   WISDOM TOOTH EXTRACTION     Family History:  Family History  Problem Relation Age of Onset   Colon cancer Neg Hx    Esophageal cancer Neg Hx    Rectal cancer Neg Hx    Stomach cancer Neg Hx    Family Psychiatric  History:  schizophrenia- maternal grandmother, bipolar- niece, nephew  Social History:  Social History   Substance and Sexual Activity  Alcohol Use Yes   Comment: occasional     Social History   Substance and Sexual Activity  Drug Use Not Currently   Types: Marijuana    Social History   Socioeconomic History   Marital status: Single    Spouse name: Not on file   Number of children: Not on file   Years of education: Not on file   Highest education level: Not on file  Occupational History   Occupation: Stage manager  Tobacco Use   Smoking status: Every Day    Packs/day: 0.25    Types: Cigarettes   Smokeless tobacco: Never  Vaping Use   Vaping Use: Never used  Substance and Sexual Activity   Alcohol use: Yes    Comment: occasional   Drug use: Not Currently    Types: Marijuana   Sexual activity: Never  Other Topics Concern   Not on file  Social History Narrative   Not on file   Social Determinants of Health   Financial Resource Strain: Low Risk  (11/14/2017)   Overall Financial Resource Strain (CARDIA)    Difficulty of Paying Living Expenses: Not hard at all  Food Insecurity: No Food Insecurity (11/14/2017)   Hunger Vital Sign    Worried About Running Out of Food in the Last Year: Never true    Ran Out of Food in the Last Year: Never true  Transportation Needs: No Transportation Needs (11/14/2017)   PRAPARE - Hydrologist (Medical): No    Lack of Transportation (Non-Medical): No  Physical Activity: Inactive (11/14/2017)   Exercise Vital Sign    Days of Exercise per Week: 0 days    Minutes of Exercise per Session: 0 min  Stress: No Stress  Concern Present (11/14/2017)   [place]    Feeling of Stress : Not at all  Social Connections: Somewhat Isolated (11/14/2017)   Social Connection and Isolation Panel [NHANES]    Frequency of Communication with Friends and Family: More than three times a week    Frequency of Social Gatherings with Friends and Family: Once a week    Attends Religious Services: More than 4 times per year    Active Member of [employer] or Organizations: No    Attends Archivist Meetings: Never    Marital Status: Never married   Additional Social History:    Sleep: Good  Appetite:  Good  Current Medications: Current Facility-Administered Medications  Medication Dose Route Frequency Provider Last Rate Last Admin   acetaminophen (TYLENOL) tablet 650 mg  650 mg Oral Q6H PRN Rankin, Shuvon B, NP       alum & mag  hydroxide-simeth (MAALOX/MYLANTA) 200-200-20 MG/5ML suspension 30 mL  30 mL Oral Q4H PRN Rankin, Shuvon B, NP       amLODipine (NORVASC) tablet 5 mg  5 mg Oral Daily Massengill, Nathan, MD   5 mg at 05/19/22 9811   hydrOXYzine (ATARAX) tablet 25 mg  25 mg Oral TID Rankin, Shuvon B, NP   25 mg at 05/19/22 0909   OLANZapine zydis (ZYPREXA) disintegrating tablet 5 mg  5 mg Oral Q8H PRN Massengill, Ovid Curd, MD       And   LORazepam (ATIVAN) tablet 1 mg  1 mg Oral Q6H PRN Massengill, Ovid Curd, MD       And   ziprasidone (GEODON) injection 20 mg  20 mg Intramuscular Q8H PRN Massengill, Nathan, MD       magnesium hydroxide (MILK OF MAGNESIA) suspension 30 mL  30 mL Oral Daily PRN Rankin, Shuvon B, NP       metFORMIN (GLUCOPHAGE-XR) 24 hr tablet 500 mg  500 mg Oral Q breakfast Massengill, Nathan, MD   500 mg at 05/19/22 9147   nicotine (NICODERM CQ - dosed in mg/24 hours) patch 21 mg  21 mg Transdermal Daily Leevy-Johnson, Brooke A, NP       nicotine polacrilex (NICORETTE) gum 2 mg  2 mg Oral PRN Onuoha, Chinwendu V, NP       OLANZapine  zydis (ZYPREXA) disintegrating tablet 10 mg  10 mg Oral QHS Massengill, Ovid Curd, MD   10 mg at 05/18/22 2116   traZODone (DESYREL) tablet 50 mg  50 mg Oral QHS PRN Massengill, Ovid Curd, MD   50 mg at 05/18/22 2116    Lab Results:  Results for orders placed or performed during the hospital encounter of 05/17/22 (from the past 48 hour(s))  Urinalysis, Complete w Microscopic PATH Cytology Urine     Status: None   Collection Time: 05/18/22  6:14 PM  Result Value Ref Range   Color, Urine YELLOW YELLOW   APPearance CLEAR CLEAR   Specific Gravity, Urine 1.020 1.005 - 1.030   pH 5.0 5.0 - 8.0   Glucose, UA NEGATIVE NEGATIVE mg/dL   Hgb urine dipstick NEGATIVE NEGATIVE   Bilirubin Urine NEGATIVE NEGATIVE   Ketones, ur NEGATIVE NEGATIVE mg/dL   Protein, ur NEGATIVE NEGATIVE mg/dL   Nitrite NEGATIVE NEGATIVE   Leukocytes,Ua NEGATIVE NEGATIVE   WBC, UA 0-5 0 - 5 WBC/hpf   Bacteria, UA NONE SEEN NONE SEEN    Comment: Performed at [facility], [place] [address], [place], [place] [address]  Sedimentation rate     Status: None   Collection Time: 05/18/22  6:27 PM  Result Value Ref Range   Sed Rate 0 0 - 16 mm/hr    Comment: Performed at [facility], [place] [address], [place], [place] [address]  TSH     Status: None   Collection Time: 05/18/22  6:27 PM  Result Value Ref Range   TSH 2.211 0.350 - 4.500 uIU/mL    Comment: Performed by a 3rd Generation assay with a functional sensitivity of <=0.01 uIU/mL. Performed at [facility], [place] [address] [place], [place] [address]   RPR     Status: None   Collection Time: 05/18/22  6:27 PM  Result Value Ref Range   RPR Ser Ql NON REACTIVE NON REACTIVE    Comment: Performed at [facility], [place] [address] [place], [place] [address]  Vitamin B12     Status: Abnormal   Collection  Time: 05/18/22  6:27 PM  Result Value Ref Range   Vitamin B-12 176 (L) 180 - 914 pg/mL    Comment: (NOTE) This assay  is not validated for testing neonatal or myeloproliferative syndrome specimens for Vitamin B12 levels. Performed at [facility], [place] [address] [place], [place] [address]   HIV Antibody (routine testing w rflx)     Status: None   Collection Time: 05/18/22  6:27 PM  Result Value Ref Range   HIV Screen 4th Generation wRfx Non Reactive Non Reactive    Comment: Performed at [facility], [place] [address] [place], [place] [address]    Blood Alcohol level:  Lab Results  Component Value Date   ETH <10 28/31/5176    Metabolic Disorder Labs: No results found for: "HGBA1C", "MPG" No results found for: "PROLACTIN" Lab Results  Component Value Date   CHOL 214 (H) 04/25/2022   TRIG 355 (H) 04/25/2022   HDL 32 (L) 04/25/2022   CHOLHDL 6.7 (H) 04/25/2022   LDLCALC 120 (H) 04/25/2022    Physical Findings: AIMS: Facial and Oral Movements Muscles of Facial Expression: None, normal Lips and Perioral Area: None, normal Jaw: None, normal Tongue: None, normal,Extremity Movements Upper (arms, wrists, hands, fingers): None, normal Lower (legs, knees, ankles, toes): None, normal, Trunk Movements Neck, shoulders, hips: None, normal, Overall Severity Severity of abnormal movements (highest score from questions above): None, normal Incapacitation due to abnormal movements: None, normal Patient's awareness of abnormal movements (rate only patient's report): No Awareness, Dental Status Current problems with teeth and/or dentures?: No Does patient usually wear dentures?: No  CIWA:    COWS:     Musculoskeletal: Strength & Muscle Tone: within normal limits Gait & Station: normal Patient leans: N/A  Psychiatric Specialty Exam:  Presentation  General Appearance: Appropriate for Environment; Casual; Fairly Groomed  Eye Contact:Good  Speech:Clear and Coherent  Speech Volume:Normal  Handedness:Right  Mood and Affect  Mood:Euthymic  Affect:Appropriate  Thought  Process  Thought Processes:Coherent  Descriptions of Associations:Loose  Orientation:Full (Time, Place and Person)  Thought Content:Logical  History of Schizophrenia/Schizoaffective disorder:No data recorded Duration of Psychotic Symptoms:No data recorded Hallucinations:Hallucinations: None Description of Visual Hallucinations: Reports seeing [place] of Reference:Paranoia  Suicidal Thoughts:Suicidal Thoughts: No  Homicidal Thoughts:Homicidal Thoughts: No  Sensorium  Memory:Immediate Fair; Recent Fair; Remote [place]  Insight:Fair  Executive Functions  Concentration:Good  Attention Span:Good  [place]  Language:Good  Psychomotor Activity  Psychomotor Activity:Psychomotor Activity: Normal  Assets  Assets:Communication Skills; Desire for Improvement; Social Support  Sleep  Sleep:Sleep: Good Number of Hours of Sleep: 7.5  Physical Exam: Physical Exam Vitals and nursing note reviewed.  Constitutional:      Appearance: Normal appearance.  HENT:     Head: Normocephalic.     Right Ear: External ear normal.     Left Ear: External ear normal.     Nose: Nose normal.     Mouth/Throat:     Mouth: Mucous membranes are moist.     Pharynx: Oropharynx is clear.  Eyes:     Extraocular Movements: Extraocular movements intact.     Conjunctiva/sclera: Conjunctivae normal.     Pupils: Pupils are equal, round, and reactive to light.  Cardiovascular:     Rate and Rhythm: Tachycardia present.     Comments: Blood pressure 128/81, pulse 123.  Nursing staff to recheck vital signs Pulmonary:     Effort: Pulmonary effort is normal.  Abdominal:  Palpations: Abdomen is soft.  Genitourinary:    Comments: Deferred Musculoskeletal:        General: Normal range of motion.     Cervical back: Normal range of motion.  Skin:    General: Skin is warm.  Neurological:     General: No focal deficit present.     Mental  Status: He is alert and oriented to person, place, and time.  Psychiatric:        Mood and Affect: Mood normal.        Behavior: Behavior normal.    Review of Systems  Constitutional: Negative.  Negative for chills and fever.  HENT: Negative.  Negative for hearing loss and tinnitus.   Eyes: Negative.  Negative for blurred vision and double vision.  Respiratory: Negative.  Negative for cough, sputum production, shortness of breath and wheezing.   Cardiovascular: Negative.  Negative for chest pain and palpitations.       Blood pressure 128/81, pulse 123.  Nursing staff to recheck vital signs  Gastrointestinal: Negative.  Negative for abdominal pain, constipation, diarrhea, heartburn, nausea and vomiting.  Genitourinary: Negative.  Negative for dysuria, frequency and urgency.  Musculoskeletal: Negative.  Negative for myalgias and neck pain.  Skin: Negative.  Negative for itching and rash.  Neurological: Negative.  Negative for dizziness, tingling and headaches.  Endo/Heme/Allergies: Negative.  Negative for environmental allergies and polydipsia. Does not bruise/bleed easily.  Psychiatric/Behavioral:  Positive for depression. The patient is nervous/anxious.    Blood pressure 128/81, pulse (!) 123, temperature 97.7 F (36.5 C), temperature source Oral, resp. rate 18, height 6' (1.829 m), weight 92.1 kg, SpO2 96 %. Body mass index is 27.53 kg/m.  Treatment Plan Summary: Daily contact with patient to assess and evaluate symptoms and progress in treatment, Medication management, and Plan :   Observation Level/Precautions:  15 minute checks  Laboratory:   psychiatric panel: ESR, ANA w/ reflex, Ceruplasmin, LTFs, TSH, RPR, B12, HIV, u/a, CT Head pending.  Glucose 118  Psychotherapy:  Unit group sessions  Medications:  see MAR  Consultations:  To Be Determined  Discharge Concerns:  Safety, medication compliance, mood stability  Estimated LOS: 5-7 days  Other:  n/a    Physician Treatment  Plan for Primary Diagnosis: Schizoaffective disorder, bipolar type ([place]) Long Term Goal(s): Improvement in symptoms so as ready for discharge   Short Term Goals: Ability to identify changes in lifestyle to reduce recurrence of condition will improve, Ability to verbalize feelings will improve, Ability to disclose and discuss suicidal ideas, Ability to demonstrate self-control will improve, Ability to identify and develop effective coping behaviors will improve, Ability to maintain clinical measurements within normal limits will improve, and Compliance with prescribed medications will improve   Schizoaffective disorder, bipolar type -Increase Olanzapine Zydis 10 mg daily at bedtime: (Educated on rationales, benefits and possible side effects of this medication including weight gan & metabolic syndrome and verbalizes understanding).   Depression -Discontinue Celexa 20 mg daily    Tobacco use disorder -Nicotine patch 21 mg transdermal daily -Nicorette gum 2 mg as needed   Anxiety -Hydroxyzine (Atarax) 25 mg TID   Hyperglycemia -Daily Metformin 500 mg ER daily for breakfast   Other PRNs -Continue Tylenol 650 mg every 6 hours PRN for mild pain -Continue Maalox 30 mg every 4 hrs PRN for indigestion -Continue Milk of Magnesia as needed every 6 hrs for constipation   Pending Labs: psychiatric panel: ESR, ANA w/ reflex, Ceruplasmin, LTFs, TSH, RPR, B12, HIV, u/a, CT Head  pending. Glucose 282   I certify that inpatient services furnished can reasonably be expected to improve the patient's condition.      Laretta Bolster, FNP 05/19/2022, 2:20 PM

## 2022-05-19 NOTE — Progress Notes (Signed)
Patient gone to [facility] for ordered CT of his head. MHT accompanied patient.

## 2022-05-19 NOTE — Progress Notes (Signed)
   05/19/22 0000  Psych Admission Type (Psych Patients Only)  Admission Status Voluntary  Psychosocial Assessment  Patient Complaints Nervousness  Eye Contact Fair  Facial Expression Animated  Affect Appropriate to circumstance  Speech Tangential  Interaction Dominating;Hypervigilant  Motor Activity Fidgety  Appearance/Hygiene Unremarkable  Behavior Characteristics Cooperative;Appropriate to situation  Mood Anxious  Thought Process  Coherency WDL  Content Blaming others  Delusions Controlled  Perception Hallucinations  Hallucination Visual  Judgment Limited  Confusion None  Danger to Self  Current suicidal ideation? Denies  Self-Injurious Behavior No self-injurious ideation or behavior indicators observed or expressed   Agreement Not to Harm Self Yes  Description of Agreement verbal  Danger to Others  Danger to Others None reported or observed

## 2022-05-19 NOTE — [facility] Group Notes (Signed)
Group Date: 05/19/2022 Start Time: 1300 End Time: 1400     Type of Therapy and Topic:  Group Therapy - How To Cope with Nervousness about Discharge    Participation Level:  Did not attend     Description of Group This process group involved identification of patients' feelings about discharge. Some of them are scheduled to be discharged soon, while others are new admissions, but each of them was asked to share thoughts and feelings surrounding discharge from the hospital. One common theme was that they are excited at the prospect of going home, while another was that many of them are apprehensive about sharing why they were hospitalized. Patients were given the opportunity to discuss these feelings with their peers in preparation for discharge.   Therapeutic Goals   Patient will identify their overall feelings about pending discharge. Patient will think about how they might proactively address issues that they believe will once again arise once they get home (i.e. with parents). Patients will participate in discussion about having hope for change.     Summary of Patient Progress:  Did not attend      Therapeutic Modalities Cognitive Behavioral Therapy 

## 2022-05-20 MED ORDER — VITAMIN B-12 1000 MCG PO TABS
1000.0000 ug | ORAL_TABLET | Freq: Every day | ORAL | Status: DC
  Administered 2022-05-20 – 2022-05-26 (×7): 1000 ug via ORAL
  Filled 2022-05-20 (×9): qty 1

## 2022-05-20 MED ORDER — BIOTENE DRY MOUTH MT LIQD: 15.0000 mL | OROMUCOSAL | Status: DC | PRN

## 2022-05-20 NOTE — Progress Notes (Signed)
Adult Psychoeducational Group Note  Date:  05/20/2022 Time:  9:28 PM  Group Topic/Focus:  Wrap-Up Group:   The focus of this group is to help patients review their daily goal of treatment and discuss progress on daily workbooks.  Participation Level:  Active  Participation Quality:  Appropriate  Affect:  Appropriate  Cognitive:  Appropriate  Insight: Appropriate  Engagement in Group:  Engaged  Modes of Intervention:  Discussion  Additional Comments:  Marcus Martinez said his day was 5. His goal for today have more gratitude toward others. Marcus Martinez said somewhat achieved goal. Coping skills reading talking to others about their experiences being in group. Marcus Martinez description of his day misconsuption.   [name] 05/20/2022, 9:28 PM

## 2022-05-20 NOTE — B[facility] Group Notes (Signed)
.  Psychoeducational Group Note    Date:  05/20/2022 Time: 1300-1400    Purpose of Group: . The group focus' on teaching patients on how to identify their needs and their Life Skills:  A group where two lists are made. What people need and what are things that we do that are unhealthy. The lists are developed by the patients and it is explained that we often do the actions that are not healthy to get our list of needs met.  Goal:: to develop the coping skills needed to get their needs met  Participation Level:  Active  Participation Quality:  Appropriate  Affect:  Appropriate  Cognitive:  Oriented  Insight: Improving  Engagement in Group:  Engaged  Modes of Intervention:  Activity, Discussion, Education, and Support  Additional Comments:  Rates energy at a 6/10  Bryson Dames A

## 2022-05-20 NOTE — Group Note (Signed)
LCSW Group Therapy Note  05/20/2022      Type of Therapy and Topic:  Group Therapy: Gratitude   Description:   Group could not be held by social worker, but licensed RN did provide group.  A handout was given to each patient, with the following information:   Gratitude  "Acknowledging the good that you already have in your life is the foundation for all abundance." - Eckhart Tolle  " 'Enough' is a feast." - Buddhist Proverb  "Gratitude sweetens even the smallest moments."  "It is not joy that makes us grateful; It is gratitude that makes us joyful." - David Steindl-Rast    Put at least one response under each category of something for which you are grateful:  People:  Experiences:  Things:  Places:  Skills:  Other:  Add more responses as you get ideas from other people.   Therapeutic Modalities:   Activity  Marcus Ojeda [name], LCSW .  

## 2022-05-20 NOTE — Progress Notes (Addendum)
[facility] MD Progress Note  05/20/2022 12:54 PM Marcus Martinez  MRN:  [id number]  Subjective: Marcus Martinez states, "I feel tired today and having a headache, however, my mental state is stabilized with the medications and thinking."  Brief history:  Marcus Martinez is a 31 year old with a past psychiatric history major depressive disorder, anxiety, unspecified mood disorder, ADHD who presented to [facility] voluntarily seeking help for anxiety medication. Patient reported starting new medication x1 week reporting increased mania, inability to focus, and paranoia that the government is taking over. Per chart review, pt presented to [facility] 05/12/22 reporting adverse reaction to Citalopram including suicidal ideations, diarrhea; denied any active SI on assessment; pt was discharged and told to discontinue Citalopram, and start Azithromycine 250 mg (Z-Pak) for sore throat/strep throat. Seen 04/25/22 at PCP where his GAD was 20, PHQ-9 was 17; given handouts, and told to follow up with psychiatry at his 05/31/22 appointment.    Yesterday's psychiatric team recommendation: -Increase Olanzapine Zydis 10 mg daily at bedtime: (Educated on rationales, benefits and possible side effects of this medication including weight gan & metabolic syndrome and verbalizes understanding).  -Discontinue Celexa 20 mg daily  05/18/22 -Hydroxyzine (Atarax) 25 mg TID for anxiety -Trazodone 50 mg tablets p.o. as needed at bedtime for sleep -Agitation protocol (see MAR)  Today's assessment: On assessment today, patient is seen and examined on [address]. He appears calm and participating in the exam.  Chart reviewed and findings shared with the treatment team and consult with Dr. Nelda Marseille.  Alert and oriented x 4, to person, time, place, and situation. Speech is fluent and clear, and able to maintain good eye contact with the provider throughout the encounter.  Presents with anxious and depressed mood, and affect is  congruent. Reports anxiety and rates as 7, on a scale of 0-10 with 10 being the worst.  Reports he is getting along with fellow patients.  Observed participating in group activities with other patients.  Endorsed taking medication as prescribed, and denies any somatic symptoms.  When asked about sleep apnea, patient states it has been bothering him at night for the past year and a half.  Reports that his PCP knew about it and will follow-up with sleep study upon discharge.  Reports dry mouth, Biotene mouth wash ordered for patient with some relief.  Reports headache, encouraged to asked nursing staff for as needed medication for headache.  Patient endorses very good appetite, endorses sleeping over 9 hours last night.  Denies SI, HI or AVH.  Paranoid ideation observed when patient states, "I still feel like the [place] flies are following me.  No recent labs to report except vitamin B12 level low at 176, (normal 182 to 914).  Psychiatric Panel reviewed include: CT of head on 05/19/2022 negative, ANA L IPG, TSH 2.211 normal, RPR nonreactive, HIV nonreactive, ceruloplasmin 19.4 normal, vitamin B12 176 low, liver function tests normal, urinalysis negative.  Principal Problem: Schizoaffective disorder, bipolar type ([place])  Diagnosis: Principal Problem:   Schizoaffective disorder, bipolar type ([place]) Active Problems:   Anxiety   Mood disorder of manic type (HCC)   OCD (obsessive compulsive disorder)  Total Time spent with patient: 45 minutes  Past Psychiatric History: major depressive disorder, anxiety    Past Medical History:  Past Medical History:  Diagnosis Date   ADHD (attention deficit hyperactivity disorder)    GAD (generalized anxiety disorder)    Major depressive disorder     Past Surgical History:  Procedure Laterality  Date   WISDOM TOOTH EXTRACTION     Family History:  Family History  Problem Relation Age of Onset   Colon cancer Neg Hx    Esophageal cancer Neg Hx    Rectal cancer  Neg Hx    Stomach cancer Neg Hx    Family Psychiatric  History:  schizophrenia- maternal grandmother, bipolar- niece, nephew  Social History:  Social History   Substance and Sexual Activity  Alcohol Use Yes   Comment: occasional     Social History   Substance and Sexual Activity  Drug Use Not Currently   Types: Marijuana    Social History   Socioeconomic History   Marital status: Single    Spouse name: Not on file   Number of children: Not on file   Years of education: Not on file   Highest education level: Not on file  Occupational History   Occupation: Stage manager  Tobacco Use   Smoking status: Every Day    Packs/day: 0.25    Types: Cigarettes   Smokeless tobacco: Never  Vaping Use   Vaping Use: Never used  Substance and Sexual Activity   Alcohol use: Yes    Comment: occasional   Drug use: Not Currently    Types: Marijuana   Sexual activity: Never  Other Topics Concern   Not on file  Social History Narrative   Not on file   Social Determinants of Health   Financial Resource Strain: Low Risk  (11/14/2017)   Overall Financial Resource Strain (CARDIA)    Difficulty of Paying Living Expenses: Not hard at all  Food Insecurity: No Food Insecurity (11/14/2017)   Hunger Vital Sign    Worried About Running Out of Food in the Last Year: Never true    Ran Out of Food in the Last Year: Never true  Transportation Needs: No Transportation Needs (11/14/2017)   PRAPARE - Hydrologist (Medical): No    Lack of Transportation (Non-Medical): No  Physical Activity: Inactive (11/14/2017)   Exercise Vital Sign    Days of Exercise per Week: 0 days    Minutes of Exercise per Session: 0 min  Stress: No Stress Concern Present (11/14/2017)   [place]    Feeling of Stress : Not at all  Social Connections: Somewhat Isolated (11/14/2017)   Social Connection and Isolation Panel  [NHANES]    Frequency of Communication with Friends and Family: More than three times a week    Frequency of Social Gatherings with Friends and Family: Once a week    Attends Religious Services: More than 4 times per year    Active Member of [employer] or Organizations: No    Attends Archivist Meetings: Never    Marital Status: Never married   Additional Social History:    Sleep: Good  Appetite:  Good  Current Medications: Current Facility-Administered Medications  Medication Dose Route Frequency Provider Last Rate Last Admin   acetaminophen (TYLENOL) tablet 650 mg  650 mg Oral Q6H PRN Rankin, Shuvon B, NP   650 mg at 05/19/22 2132   alum & mag hydroxide-simeth (MAALOX/MYLANTA) 200-200-20 MG/5ML suspension 30 mL  30 mL Oral Q4H PRN Rankin, Shuvon B, NP       amLODipine (NORVASC) tablet 5 mg  5 mg Oral Daily Massengill, Nathan, MD   5 mg at 05/20/22 0842   hydrOXYzine (ATARAX) tablet 25 mg  25 mg Oral TID Rankin, Shuvon  B, NP   25 mg at 05/20/22 0841   OLANZapine zydis (ZYPREXA) disintegrating tablet 5 mg  5 mg Oral Q8H PRN Massengill, Ovid Curd, MD       And   LORazepam (ATIVAN) tablet 1 mg  1 mg Oral Q6H PRN Massengill, Ovid Curd, MD       And   ziprasidone (GEODON) injection 20 mg  20 mg Intramuscular Q8H PRN Massengill, Ovid Curd, MD       magnesium hydroxide (MILK OF MAGNESIA) suspension 30 mL  30 mL Oral Daily PRN Rankin, Shuvon B, NP       metFORMIN (GLUCOPHAGE-XR) 24 hr tablet 500 mg  500 mg Oral Q breakfast Massengill, Nathan, MD   500 mg at 05/20/22 0841   nicotine (NICODERM CQ - dosed in mg/24 hours) patch 21 mg  21 mg Transdermal Daily Leevy-Johnson, Brooke A, NP   21 mg at 05/20/22 4128   nicotine polacrilex (NICORETTE) gum 2 mg  2 mg Oral PRN Onuoha, Chinwendu V, NP       OLANZapine zydis (ZYPREXA) disintegrating tablet 10 mg  10 mg Oral QHS Massengill, Ovid Curd, MD   10 mg at 05/19/22 2130   traZODone (DESYREL) tablet 50 mg  50 mg Oral QHS PRN Massengill, Ovid Curd, MD   50 mg at  05/18/22 2116    Lab Results:  Results for orders placed or performed during the hospital encounter of 05/17/22 (from the past 48 hour(s))  Urinalysis, Complete w Microscopic PATH Cytology Urine     Status: None   Collection Time: 05/18/22  6:14 PM  Result Value Ref Range   Color, Urine YELLOW YELLOW   APPearance CLEAR CLEAR   Specific Gravity, Urine 1.020 1.005 - 1.030   pH 5.0 5.0 - 8.0   Glucose, UA NEGATIVE NEGATIVE mg/dL   Hgb urine dipstick NEGATIVE NEGATIVE   Bilirubin Urine NEGATIVE NEGATIVE   Ketones, ur NEGATIVE NEGATIVE mg/dL   Protein, ur NEGATIVE NEGATIVE mg/dL   Nitrite NEGATIVE NEGATIVE   Leukocytes,Ua NEGATIVE NEGATIVE   WBC, UA 0-5 0 - 5 WBC/hpf   Bacteria, UA NONE SEEN NONE SEEN    Comment: Performed at [facility], [place] [address], [place], [place] [address]  Sedimentation rate     Status: None   Collection Time: 05/18/22  6:27 PM  Result Value Ref Range   Sed Rate 0 0 - 16 mm/hr    Comment: Performed at [facility], [place] [address], [place], [place] [address]  ANA w/Reflex if Positive     Status: None   Collection Time: 05/18/22  6:27 PM  Result Value Ref Range   Anti Nuclear Antibody (ANA) LIPG     Comment: (NOTE) Test not performed. Specimen is grossly lipemic. 05/19/2022-[place] Performed At: [facility] [place], [place] [id number] Rush Farmer MD QH:[id number]   Ceruloplasmin     Status: None   Collection Time: 05/18/22  6:27 PM  Result Value Ref Range   Ceruloplasmin 19.4 16.0 - 31.0 mg/dL    Comment: (NOTE) Performed At: [facility] [place], [place] [id number] Rush Farmer MD TZ:[id number]   TSH     Status: None   Collection Time: 05/18/22  6:27 PM  Result Value Ref Range   TSH 2.211 0.350 - 4.500 uIU/mL    Comment: Performed by a 3rd Generation assay with a functional sensitivity of <=0.01 uIU/mL. Performed at [facility], [place]  [address] [place],  [address]  RPR     Status: None   Collection Time: 05/18/22  6:27 PM  Result Value Ref Range   RPR Ser Ql NON REACTIVE NON REACTIVE    Comment: Performed at [facility], [address], [place], [place] [address]  Vitamin B12     Status: Abnormal   Collection Time: 05/18/22  6:27 PM  Result Value Ref Range   Vitamin B-12 176 (L) 180 - 914 pg/mL    Comment: (NOTE) This assay is not validated for testing neonatal or myeloproliferative syndrome specimens for Vitamin B12 levels. Performed at [facility], [place] [address], [place], [place] [address]   HIV Antibody (routine testing w rflx)     Status: None   Collection Time: 05/18/22  6:27 PM  Result Value Ref Range   HIV Screen 4th Generation wRfx Non Reactive Non Reactive    Comment: Performed at [facility], [place] [address], [place], [place] [address]    Blood Alcohol level:  Lab Results  Component Value Date   ETH <10 17/49/4496    Metabolic Disorder Labs: No results found for: "HGBA1C", "MPG" No results found for: "PROLACTIN" Lab Results  Component Value Date   CHOL 214 (H) 04/25/2022   TRIG 355 (H) 04/25/2022   HDL 32 (L) 04/25/2022   CHOLHDL 6.7 (H) 04/25/2022   LDLCALC 120 (H) 04/25/2022    Physical Findings: AIMS: Facial and Oral Movements Muscles of Facial Expression: None, normal Lips and Perioral Area: None, normal Jaw: None, normal Tongue: None, normal,Extremity Movements Upper (arms, wrists, hands, fingers): None, normal Lower (legs, knees, ankles, toes): None, normal, Trunk Movements Neck, shoulders, hips: None, normal, Overall Severity Severity of abnormal movements (highest score from questions above): None, normal Incapacitation due to abnormal movements: None, normal Patient's awareness of abnormal movements (rate only patient's report): No Awareness, Dental Status Current problems with teeth and/or dentures?: No Does patient usually wear  dentures?: No  CIWA:    COWS:     Musculoskeletal: Strength & Muscle Tone: within normal limits Gait & Station: normal Patient leans: N/A  Psychiatric Specialty Exam:  Presentation  General Appearance: Appropriate for Environment; Casual; Fairly Groomed  Eye Contact:Good  Speech:Clear and Coherent; Normal Rate  Speech Volume:Normal  Handedness:Right  Mood and Affect  Mood:Anxious; Depressed  Affect:Appropriate; Congruent  Thought Process  Thought Processes:Coherent  Descriptions of Associations:Intact  Orientation:Full (Time, Place and Person)  Thought Content:Logical  History of Schizophrenia/Schizoaffective disorder:No data recorded Duration of Psychotic Symptoms:No data recorded Hallucinations:Hallucinations: Visual Description of Visual Hallucinations: Continues to report seeing [place] (Continues to report seeing [place])  Ideas of Reference:Paranoia  Suicidal Thoughts:Suicidal Thoughts: No  Homicidal Thoughts:Homicidal Thoughts: No  Sensorium  Memory:Immediate Fair; Recent Fair; Remote [place]  Insight:Fair  Executive Functions  Concentration:Good  Attention Span:Good  [place]  Language:Good  Psychomotor Activity  Psychomotor Activity:Psychomotor Activity: Normal  Assets  Assets:Communication Skills; Physical Health; Desire for Improvement  Sleep  Sleep:Sleep: Good Number of Hours of Sleep: 9  Physical Exam: Physical Exam Vitals and nursing note reviewed.  Constitutional:      Appearance: Normal appearance.  HENT:     Head: Normocephalic.     Right Ear: External ear normal.     Left Ear: External ear normal.     Nose: Nose normal.     Mouth/Throat:     Mouth: Mucous membranes are moist.     Pharynx: Oropharynx is clear.  Eyes:  Extraocular Movements: Extraocular movements intact.     Conjunctiva/sclera: Conjunctivae normal.     Pupils: Pupils are equal,  round, and reactive to light.  Cardiovascular:     Rate and Rhythm: Normal rate.     Pulses: Normal pulses.     Comments: Blood pressure 128/81, pulse 123.  Nursing staff to recheck vital signs Pulmonary:     Effort: Pulmonary effort is normal.  Abdominal:     Palpations: Abdomen is soft.  Genitourinary:    Comments: Deferred Musculoskeletal:        General: Normal range of motion.     Cervical back: Normal range of motion.  Skin:    General: Skin is warm.  Neurological:     General: No focal deficit present.     Mental Status: He is alert and oriented to person, place, and time.  Psychiatric:        Mood and Affect: Mood normal.        Behavior: Behavior normal.    Review of Systems  Constitutional: Negative.  Negative for chills and fever.  HENT: Negative.  Negative for hearing loss and tinnitus.   Eyes: Negative.  Negative for blurred vision and double vision.  Respiratory: Negative.  Negative for cough, sputum production, shortness of breath and wheezing.   Cardiovascular: Negative.  Negative for chest pain and palpitations.  Gastrointestinal: Negative.  Negative for abdominal pain, constipation, diarrhea, heartburn, nausea and vomiting.  Genitourinary: Negative.  Negative for dysuria, frequency and urgency.  Musculoskeletal: Negative.  Negative for myalgias and neck pain.  Skin: Negative.  Negative for itching and rash.  Neurological: Negative.  Negative for dizziness, tingling and headaches.  Endo/Heme/Allergies: Negative.  Negative for environmental allergies and polydipsia. Does not bruise/bleed easily.  Psychiatric/Behavioral:  Positive for depression. The patient is nervous/anxious.    Blood pressure 114/68, pulse 92, temperature 98.1 F (36.7 C), temperature source Oral, resp. rate 18, height 6' (1.829 m), weight 92.1 kg, SpO2 97 %. Body mass index is 27.53 kg/m.  Treatment Plan Summary: Daily contact with patient to assess and evaluate symptoms and progress in  treatment, Medication management, and Plan :   Observation Level/Precautions:  15 minute checks  Laboratory:   psychiatric panel: ESR, ANA w/ reflex, Ceruplasmin, LTFs, TSH, RPR, B12, HIV, u/a, CT Head pending.  Glucose 118  Psychotherapy:  Unit group sessions  Medications:  see MAR  Consultations:  To Be Determined  Discharge Concerns:  Safety, medication compliance, mood stability  Estimated LOS: 5-7 days  Other:  n/a    Physician Treatment Plan for Primary Diagnosis: Schizoaffective disorder, bipolar type ([place]) Long Term Goal(s): Improvement in symptoms so as ready for discharge   Short Term Goals: Ability to identify changes in lifestyle to reduce recurrence of condition will improve, Ability to verbalize feelings will improve, Ability to disclose and discuss suicidal ideas, Ability to demonstrate self-control will improve, Ability to identify and develop effective coping behaviors will improve, Ability to maintain clinical measurements within normal limits will improve, and Compliance with prescribed medications will improve   Schizoaffective disorder, bipolar type -Continue Olanzapine Zydis 10 mg daily at bedtime: (Educated on rationales, benefits and possible side effects of this medication including weight gan & metabolic syndrome and verbalizes understanding).   Depression -Discontinued Celexa 20 mg daily    Tobacco use disorder -Nicotine patch 21 mg transdermal daily -Nicorette gum 2 mg as needed   Anxiety -Hydroxyzine (Atarax) 25 mg TID   Hyperglycemia -Daily Metformin 500 mg ER daily  for breakfast  Vitamin B12 deficiency - start Vit B12 1016mg daily   Other PRNs -Continue Tylenol 650 mg every 6 hours PRN for mild pain -Continue Maalox 30 mg every 4 hrs PRN for indigestion -Continue Milk of Magnesia as needed every 6 hrs for constipation   Pending Labs: Psychiatric Panel reviewed include: CT of head on 05/19/2022 negative, ANA  unpreformed - will recollect; TSH  2.211 normal, RPR nonreactive, HIV nonreactive, ceruloplasmin 19.4 normal, vitamin B12 176 low, liver function tests normal, urinalysis negative; ESR 0   I certify that inpatient services furnished can reasonably be expected to improve the patient's condition.      TLaretta Bolster FNP 05/20/2022, 12:54 PMPatient ID: BLupe Carney male   DOB: 11/03/1990/01/29 31y.o.   MRN: [id number]

## 2022-05-21 MED ORDER — OLANZAPINE 15 MG PO TBDP
15.0000 mg | ORAL_TABLET | Freq: Every day | ORAL | Status: DC
  Administered 2022-05-21: 15 mg via ORAL
  Filled 2022-05-21 (×4): qty 1

## 2022-05-21 NOTE — Group Note (Deleted)
LCSW Group Therapy Note  Group Date: 05/21/2022 Start Time: 1000 End Time: 1001   Type of Therapy and Topic:  Group Therapy: Positive Affirmations  Participation Level:  {BHH PARTICIPATION LEVEL:22264}   Description of Group:   This group addressed positive affirmation towards self and others.  Patients went around the room and identified two positive things about themselves and two positive things about a peer in the room.  Patients reflected on how it felt to share something positive with others, to identify positive things about themselves, and to hear positive things from others/ Patients were encouraged to have a daily reflection of positive characteristics or circumstances.   Therapeutic Goals: 1. Patients will verbalize two of their positive qualities 2. Patients will demonstrate empathy for others by stating two positive qualities about a peer in the group 3. Patients will verbalize their feelings when voicing positive self affirmations and when voicing positive affirmations of others 4. Patients will discuss the potential positive impact on their wellness/recovery of focusing on positive traits of self and others.  Summary of Patient Progress:  *** actively engaged in the discussion and . S*** was able***or not able to identify positive affirmations about ***self as well as other group members. Patient demonstrated *** insight into the subject matter, was respectful of peers, participated throughout the entire session.  Therapeutic Modalities:   Cognitive Behavioral Therapy Motivational Interviewing    Vi Biddinger [name], LCSWA 05/21/2022  8:49 AM    

## 2022-05-21 NOTE — Progress Notes (Signed)
   05/21/22 2000  Psych Admission Type (Psych Patients Only)  Admission Status Voluntary  Psychosocial Assessment  Patient Complaints Anxiety  Eye Contact Fair  Facial Expression Animated  Affect Appropriate to circumstance  Speech Logical/coherent;Pressured  Interaction Assertive  Motor Activity Fidgety  Appearance/Hygiene Unremarkable  Behavior Characteristics Cooperative;Appropriate to situation  Mood Anxious  Thought Process  Coherency WDL  Content WDL  Delusions Paranoid  Perception Hallucinations  Hallucination None reported or observed  Judgment Poor  Confusion None  Danger to Self  Current suicidal ideation? Denies  Self-Injurious Behavior No self-injurious ideation or behavior indicators observed or expressed   Agreement Not to Harm Self Yes  Description of Agreement verbal contract  Danger to Others  Danger to Others None reported or observed

## 2022-05-21 NOTE — Progress Notes (Signed)
   05/21/22 0700  Sleep  Number of Hours 7    

## 2022-05-21 NOTE — BHH Group Notes (Signed)
Adult Psychoeducational Group  Date:  05/21/2022 Time:  1300-1400  Group Topic/Focus: Continuation of the group from Saturday. Looking at the lists that were created and talking about what needs to be done with the homework of 30 positives about themselves.                                     Talking about taking their power back and helping themselves to develop a positive self esteem.      Participation Quality:  Appropriate  Affect:  Appropriate  Cognitive:  Oriented  Insight: Improving  Engagement in Group:  Engaged  Modes of Intervention:  Activity, Discussion, Education, and Support  Additional Comments:  Rates his energy at a 5/10. Participated fully in the group.  Paulino Rily

## 2022-05-21 NOTE — Progress Notes (Signed)
   05/20/22 2300  Psych Admission Type (Psych Patients Only)  Admission Status Voluntary  Psychosocial Assessment  Patient Complaints Anxiety  Eye Contact Fair  Facial Expression Animated  Affect Appropriate to circumstance  Speech Logical/coherent  Interaction Dominating  Motor Activity Restless  Appearance/Hygiene Unremarkable  Behavior Characteristics Cooperative  Mood Anxious;Pleasant  Aggressive Behavior  Effect No apparent injury  Thought Process  Coherency WDL  Content WDL  Delusions Paranoid  Perception WDL  Hallucination None reported or observed  Judgment Limited  Confusion None  Danger to Self  Current suicidal ideation? Denies  Self-Injurious Behavior No self-injurious ideation or behavior indicators observed or expressed   Agreement Not to Harm Self Yes  Description of Agreement Verbal contract  Danger to Others  Danger to Others None reported or observed

## 2022-05-21 NOTE — Progress Notes (Signed)
   05/21/22 2230  Psych Admission Type (Psych Patients Only)  Admission Status Voluntary  Psychosocial Assessment  Patient Complaints Anxiety  Eye Contact Brief  Facial Expression Animated  Affect Appropriate to circumstance  Speech Logical/coherent  Interaction Assertive  Motor Activity Restless  Appearance/Hygiene Meticulous  Behavior Characteristics Appropriate to situation;Cooperative  Mood Anxious  Thought Process  Coherency WDL  Content WDL  Delusions Paranoid  Perception Hallucinations  Hallucination None reported or observed  Judgment Poor  Confusion None  Danger to Self  Current suicidal ideation? Denies  Self-Injurious Behavior No self-injurious ideation or behavior indicators observed or expressed   Agreement Not to Harm Self Yes  Description of Agreement verbally  Danger to Others  Danger to Others None reported or observed

## 2022-05-21 NOTE — Progress Notes (Signed)
Adult Psychoeducational Group Note  Date:  05/21/2022 Time:  9:45 PM  Group Topic/Focus:  Wrap-Up Group:   The focus of this group is to help patients review their daily goal of treatment and discuss progress on daily workbooks.  Participation Level:  Active  Participation Quality:  Resistant  Affect:  Labile  Cognitive:  Disorganized  Insight: Good  Engagement in Group:  Distracting, Off Topic, and Resistant  Modes of Intervention:  Discussion  Additional Comments:   Pt states that he had an okay day. Pt expressed some anger about scheduling throughout the day and began to get aggressive with Probation officer. During group, pt had to be constantly redirected and told to refrain from side conversations and distracting behaviors. Pt stated that he felt like the government was spying on him when asked about [facility] Regulatory affairs officer thinks this was an attempt to gain a reaction from the group).   Marcus Martinez 05/21/2022, 9:45 PM

## 2022-05-21 NOTE — Progress Notes (Addendum)
[facility] MD Progress Note  05/21/2022 4:17 PM [name]  MRN:  [id number]  Subjective: [name] states, "I I feel happy today because I had a pleasant visit with my mother yesterday who brought me some new shoes and change of clothes.  We had a pleasant visit."  Brief history:  Marcus Martinez is a 31 year old with a past psychiatric history major depressive disorder, anxiety, unspecified mood disorder, ADHD who presented to [facility] voluntarily seeking help for anxiety medication. Patient reported starting new medication x1 week reporting increased mania, inability to focus, and paranoia that the government is taking over. Per chart review, pt presented to [facility] [place] 05/12/22 reporting adverse reaction to Citalopram including suicidal ideations, diarrhea; denied any active SI on assessment; pt was discharged and told to discontinue Citalopram, and start Azithromycine 250 mg (Z-Pak) for sore throat/strep throat. Seen 04/25/22 at PCP where his GAD was 20, PHQ-9 was 17; given handouts, and told to follow up with psychiatry at his 05/31/22 appointment.    Yesterday's psychiatric team recommendation: -Continue Olanzapine Zydis 10 mg daily at bedtime  -Hydroxyzine (Atarax) 25 mg TID for anxiety -Trazodone 50 mg tablets p.o. as needed at bedtime for sleep - start Vit B12 1057mg daily -Daily Metformin 500 mg ER daily for breakfast  Today's assessment: On assessment today, patient is seen and examined on [address]. He appears happy, calm and participating in the exam.  Chart reviewed and findings shared with the treatment team and consult with Dr. SNelda Marseille  Alert and oriented x 4, to person, time, place, and situation. Speech is fluent and clear, and able to maintain good eye contact with the provider throughout the encounter.  Reports less depressed mood and affect is congruent. Reports anxiety and rates as 7, on a scale of 0-10 with 10 being the worst.  Reports he is getting along  with fellow patients.  Observed participating in in therapeutic milieu and group activities with other patients.  Endorsed taking medication as prescribed, and denies any somatic symptoms.  Reports dry mouth, due to sleep apnea which he plans to see his PCP after discharge, Biotene mouth wash continues with some relief. Patient endorses very good appetite, endorses sleeping over 9 hours last night.  Denies SI, HI or AVH.  Paranoid ideation observed when patient states, "I could not get out of my head, that the government is trying to take over."  Zyprexa increase to 15 mg p.o. daily.  Request to see a chaplain, and asked this provider if Zyprexa decreases sexual appeal. Educated on rationales, benefits and possible side effects of this medication including weight gan & metabolic syndrome and verbalizes understanding.  Also shared with patient as per request the results of his CT of head, hepatitis C and HIV that was nonreactive.  Psychiatric Panel reviewed include: CT of head on 05/19/2022 negative, ANA L IPG, TSH 2.211 normal, RPR nonreactive, HIV nonreactive, ceruloplasmin 19.4 normal, vitamin B12 176 low, liver function tests normal, urinalysis negative.No recent labs to report except vitamin B12 level low at 176, (normal 182 to 914).   Principal Problem: Schizoaffective disorder, bipolar type ([place]  Diagnosis: Principal Problem:   Schizoaffective disorder, bipolar type ([place] Active Problems:   Anxiety   Mood disorder of manic type (HCC)   OCD (obsessive compulsive disorder)  Total Time spent with patient: 45 minutes  Past Psychiatric History: major depressive disorder, anxiety    Past Medical History:  Past Medical History:  Diagnosis Date   ADHD (attention  deficit hyperactivity disorder)    GAD (generalized anxiety disorder)    Major depressive disorder     Past Surgical History:  Procedure Laterality Date   WISDOM TOOTH EXTRACTION     Family History:  Family History  Problem  Relation Age of Onset   Colon cancer Neg Hx    Esophageal cancer Neg Hx    Rectal cancer Neg Hx    Stomach cancer Neg Hx    Family Psychiatric  History:  schizophrenia- maternal grandmother, bipolar- niece, nephew  Social History:  Social History   Substance and Sexual Activity  Alcohol Use Yes   Comment: occasional     Social History   Substance and Sexual Activity  Drug Use Not Currently   Types: Marijuana    Social History   Socioeconomic History   Marital status: Single    Spouse name: Not on file   Number of children: Not on file   Years of education: Not on file   Highest education level: Not on file  Occupational History   Occupation: Stage manager  Tobacco Use   Smoking status: Every Day    Packs/day: 0.25    Types: Cigarettes   Smokeless tobacco: Never  Vaping Use   Vaping Use: Never used  Substance and Sexual Activity   Alcohol use: Yes    Comment: occasional   Drug use: Not Currently    Types: Marijuana   Sexual activity: Never  Other Topics Concern   Not on file  Social History Narrative   Not on file   Social Determinants of Health   Financial Resource Strain: Low Risk  (11/14/2017)   Overall Financial Resource Strain (CARDIA)    Difficulty of Paying Living Expenses: Not hard at all  Food Insecurity: No Food Insecurity (11/14/2017)   Hunger Vital Sign    Worried About Running Out of Food in the Last Year: Never true    Ran Out of Food in the Last Year: Never true  Transportation Needs: No Transportation Needs (11/14/2017)   PRAPARE - Hydrologist (Medical): No    Lack of Transportation (Non-Medical): No  Physical Activity: Inactive (11/14/2017)   Exercise Vital Sign    Days of Exercise per Week: 0 days    Minutes of Exercise per Session: 0 min  Stress: No Stress Concern Present (11/14/2017)   [place]    Feeling of Stress : Not at all   Social Connections: Somewhat Isolated (11/14/2017)   Social Connection and Isolation Panel [NHANES]    Frequency of Communication with Friends and Family: More than three times a week    Frequency of Social Gatherings with Friends and Family: Once a week    Attends Religious Services: More than 4 times per year    Active Member of [employer] or Organizations: No    Attends Archivist Meetings: Never    Marital Status: Never married   Additional Social History:    Sleep: Good  Appetite:  Good  Current Medications: Current Facility-Administered Medications  Medication Dose Route Frequency Provider Last Rate Last Admin   acetaminophen (TYLENOL) tablet 650 mg  650 mg Oral Q6H PRN Rankin, Shuvon B, NP   650 mg at 05/21/22 1201   alum & mag hydroxide-simeth (MAALOX/MYLANTA) 200-200-20 MG/5ML suspension 30 mL  30 mL Oral Q4H PRN Rankin, Shuvon B, NP       amLODipine (NORVASC) tablet 5 mg  5 mg  Oral Daily Massengill, Ovid Curd, MD   5 mg at 05/21/22 2500   antiseptic oral rinse (BIOTENE) solution 15 mL  15 mL Mouth Rinse PRN Ntuen, Kris Hartmann, FNP       cyanocobalamin (VITAMIN B12) tablet 1,000 mcg  1,000 mcg Oral Daily Nelda Marseille, Shatia Sindoni E, MD   1,000 mcg at 05/21/22 3704   hydrOXYzine (ATARAX) tablet 25 mg  25 mg Oral TID Rankin, Shuvon B, NP   25 mg at 05/21/22 1200   OLANZapine zydis (ZYPREXA) disintegrating tablet 5 mg  5 mg Oral Q8H PRN Massengill, Ovid Curd, MD       And   LORazepam (ATIVAN) tablet 1 mg  1 mg Oral Q6H PRN Massengill, Ovid Curd, MD       And   ziprasidone (GEODON) injection 20 mg  20 mg Intramuscular Q8H PRN Massengill, Nathan, MD       magnesium hydroxide (MILK OF MAGNESIA) suspension 30 mL  30 mL Oral Daily PRN Rankin, Shuvon B, NP       metFORMIN (GLUCOPHAGE-XR) 24 hr tablet 500 mg  500 mg Oral Q breakfast Massengill, Nathan, MD   500 mg at 05/21/22 8889   nicotine (NICODERM CQ - dosed in mg/24 hours) patch 21 mg  21 mg Transdermal Daily Leevy-Johnson, Brooke A, NP   21 mg at  05/21/22 0820   OLANZapine zydis (ZYPREXA) disintegrating tablet 15 mg  15 mg Oral QHS Ntuen, Tina C, FNP       traZODone (DESYREL) tablet 50 mg  50 mg Oral QHS PRN Janine Limbo, MD   50 mg at 05/18/22 2116    Lab Results:  No results found for this or any previous visit (from the past 48 hour(s)).   Blood Alcohol level:  Lab Results  Component Value Date   ETH <10 16/94/5038    Metabolic Disorder Labs: No results found for: "HGBA1C", "MPG" No results found for: "PROLACTIN" Lab Results  Component Value Date   CHOL 214 (H) 04/25/2022   TRIG 355 (H) 04/25/2022   HDL 32 (L) 04/25/2022   CHOLHDL 6.7 (H) 04/25/2022   LDLCALC 120 (H) 04/25/2022    Physical Findings: AIMS: Facial and Oral Movements Muscles of Facial Expression: None, normal Lips and Perioral Area: None, normal Jaw: None, normal Tongue: None, normal,Extremity Movements Upper (arms, wrists, hands, fingers): None, normal Lower (legs, knees, ankles, toes): None, normal, Trunk Movements Neck, shoulders, hips: None, normal, Overall Severity Severity of abnormal movements (highest score from questions above): None, normal Incapacitation due to abnormal movements: None, normal Patient's awareness of abnormal movements (rate only patient's report): No Awareness, Dental Status Current problems with teeth and/or dentures?: No Does patient usually wear dentures?: No  CIWA:    COWS:     Musculoskeletal: Strength & Muscle Tone: within normal limits Gait & Station: normal Patient leans: N/A  Psychiatric Specialty Exam:  Presentation  General Appearance: Appropriate for Environment; Casual; Fairly Groomed  Eye Contact:Good  Speech:Clear and Coherent; Normal Rate  Speech Volume:Normal  Handedness:Right  Mood and Affect  Mood:Anxious; Depressed  Affect:Appropriate; Congruent  Thought Process  Thought Processes:Ruminations about medications; goal directed  Descriptions of  Associations:Intact  Orientation:Full (Time, Place and Person)  Thought Content:paranoia and delusions about government  Hallucinations:Hallucinations: -- (Denies today) Description of Visual Hallucinations: -- (Denies today)  Ideas of Reference:Paranoia  Suicidal Thoughts:Suicidal Thoughts: No  Homicidal Thoughts:Homicidal Thoughts: No  Sensorium  Memory:Immediate Fair; Remote Fair; Recent Fair  Judgment:Fair  Insight:Present  Executive Functions  Concentration:Good  Attention Span:Good  [place]  Psychomotor Activity  Psychomotor Activity:Psychomotor Activity: Normal  Assets  Assets:Communication Skills; Physical Health; Desire for Improvement  Sleep  Sleep:Sleep: Good Number of Hours of Sleep: 8  Physical Exam: Physical Exam Vitals and nursing note reviewed.  Constitutional:      Appearance: Normal appearance.  HENT:     Head: Normocephalic.     Right Ear: External ear normal.     Left Ear: External ear normal.     Nose: Nose normal.     Mouth/Throat:     Mouth: Mucous membranes are moist.     Pharynx: Oropharynx is clear.  Eyes:     Extraocular Movements: Extraocular movements intact.     Conjunctiva/sclera: Conjunctivae normal.     Pupils: Pupils are equal, round, and reactive to light.  Cardiovascular:     Rate and Rhythm: Normal rate.     Pulses: Normal pulses.     Comments: Blood pressure 139/95, pulse 103.  Nursing staff to recheck vital signs Pulmonary:     Effort: Pulmonary effort is normal.  Abdominal:     Palpations: Abdomen is soft.  Genitourinary:    Comments: Deferred Musculoskeletal:        General: Normal range of motion.     Cervical back: Normal range of motion.  Skin:    General: Skin is warm.  Neurological:     General: No focal deficit present.     Mental Status: He is alert and oriented to person, place, and time.  Psychiatric:        Mood and Affect: Mood normal.         Behavior: Behavior normal.    Review of Systems  Constitutional: Negative.  Negative for chills and fever.  HENT: Negative.  Negative for hearing loss and tinnitus.   Eyes: Negative.  Negative for blurred vision and double vision.  Respiratory: Negative.  Negative for cough, sputum production, shortness of breath and wheezing.   Cardiovascular: Negative.  Negative for chest pain and palpitations.       Blood pressure 139/95, pulse 103.  Nursing staff to recheck vital signs  Gastrointestinal: Negative.  Negative for abdominal pain, constipation, diarrhea, heartburn, nausea and vomiting.  Genitourinary: Negative.  Negative for dysuria, frequency and urgency.  Musculoskeletal: Negative.  Negative for myalgias and neck pain.  Skin: Negative.  Negative for itching and rash.  Neurological: Negative.  Negative for dizziness, tingling and headaches.  Endo/Heme/Allergies: Negative.  Negative for environmental allergies and polydipsia. Does not bruise/bleed easily.  Psychiatric/Behavioral:  Positive for depression. The patient is nervous/anxious.    Blood pressure (!) 139/95, pulse (!) 103, temperature 98.8 F (37.1 C), temperature source Oral, resp. rate 20, height 6' (1.829 m), weight 92.1 kg, SpO2 97 %. Body mass index is 27.53 kg/m.  Treatment Plan Summary: Daily contact with patient to assess and evaluate symptoms and progress in treatment, Medication management, and Plan :   Observation Level/Precautions:  15 minute checks  Laboratory:   psychiatric panel: ESR, ANA w/ reflex, Ceruplasmin, LTFs, TSH, RPR, B12, HIV, u/a, CT Head pending.  Glucose 118  Psychotherapy:  Unit group sessions  Medications:  see MAR  Consultations:  To Be Determined  Discharge Concerns:  Safety, medication compliance, mood stability  Estimated LOS: 5-7 days  Other:  n/a    Physician Treatment Plan for Primary Diagnosis: Schizoaffective disorder, bipolar type ([place]) Long Term Goal(s): Improvement in symptoms so  as ready for discharge   Short Term  Goals: Ability to identify changes in lifestyle to reduce recurrence of condition will improve, Ability to verbalize feelings will improve, Ability to disclose and discuss suicidal ideas, Ability to demonstrate self-control will improve, Ability to identify and develop effective coping behaviors will improve, Ability to maintain clinical measurements within normal limits will improve, and Compliance with prescribed medications will improve   Schizoaffective disorder, bipolar type --Zyprexa disintegrating tablets increased to 15 mg p.o. daily on 05/21/2022 for residual paranoia/delusions -Discontinued Celexa 20 mg daily    Tobacco use disorder -Nicotine patch 21 mg transdermal daily -Nicorette gum 2 mg as needed   Anxiety -Hydroxyzine (Atarax) 25 mg TID   Hyperglycemia -Daily Metformin 500 mg ER daily for breakfast  Vitamin B12 deficiency - start Vit B12 1048mg daily   Other PRNs -Continue Tylenol 650 mg every 6 hours PRN for mild pain -Continue Maalox 30 mg every 4 hrs PRN for indigestion -Continue Milk of Magnesia as needed every 6 hrs for constipation   Pending Labs: Psychiatric Panel reviewed include: CT of head on 05/19/2022 negative, ANA  unpreformed - will recollect; TSH 2.211 normal, RPR nonreactive, HIV nonreactive, ceruloplasmin 19.4 normal, vitamin B12 176 low, liver function tests normal, urinalysis negative; ESR 0   I certify that inpatient services furnished can reasonably be expected to improve the patient's condition.      TLaretta Bolster FNP 05/21/2022, 4:17 PMPatient ID: BLupe Carney male   DOB: [birth year] 31y.o.   MRN: [id number] ID: BDrequan Ironside male   DOB: 709/02/1991 31y.o.   MRN: [id number]

## 2022-05-21 NOTE — Group Note (Signed)
LCSW Group Therapy Note  05/21/2022      Type of Therapy and Topic:  Group Therapy: Positive Affirmations   Description:   Group could not be held by social worker, but licensed RN did provide group.    CSW provided a handout for each patient, with the following information:   Positive Affirmation Worksheet  Programming your subconscious by repeating positive statements with focus, intention  and belief is a technique called positive affirmations. This Worksheet will walk you  through the process of creating your own positive affirmations.   Releasing Negative Feelings  It is believed that this process is more effective when incorporating and understanding the negative  feelings, or mental programs that you harbor within your subconscious regarding yourself. This first part  will help you identify your own negative beliefs. When you shine your conscious light on your negative  beliefs and understand that they are merely beliefs and not based on reality, you can then utilize your  positive affirmations to overcome such beliefs and focus the rudder of your own life. Write as many negative beliefs down as you feel apply to your feelings about yourself.  Use the following prompts as a guide: I never. Nobody else. I'm the only one that. I am not. I don't. I don't want. I hate. I can't. Identifying Wants  Now, considering each of the negative feelings that you wrote down about yourself, write  a list of what you really want or deserve being very specific.   Creating Affirmations  From the previous exercise, distill your wants to a list of your desires. Write down a list of  your positive affirmations. Use the following prompts as a guide: I am. I welcome. I deserve. I choose. I believe. I trust. I have. I know. I feel. I create. I LOVE. One of the most powerful affirmations starts with I am.  I am.  Vocalize Affirmations Daily  Now that you have your list of  affirmations repeat them out loud to yourself several times each  day. When you say them, focus on their meanings and visualize your life as though your  affirmations have already become reality.    Therapeutic Modalities:   Activity  Ayano Douthitt [name], LCSW .  

## 2022-05-21 NOTE — [facility] Group Notes (Signed)
Adult Psychoeducational Group Note Date:  05/21/2022 Time:  0900-1045 Group Topic/Focus: PROGRESSIVE RELAXATION. A group where deep breathing is taught and tensing and relaxation muscle groups is used. Imagery is used as well.  Pts are asked to imagine 3 pillars that hold them up when they are not able to hold themselves up and to share that with the group.  Participation Level:  Active  Participation Quality:  Appropriate  Affect:  Appropriate  Cognitive:  Oriented  Insight: Improving  Engagement in Group:  Engaged  Modes of Intervention:  Activity, Discussion, Education, and Support  Additional Comments:  Rates his energy at a 5/10. States God. Loyal friends and good co-workers hold him up.  Paulino Rily

## 2022-05-22 DIAGNOSIS — F250 Schizoaffective disorder, bipolar type: Secondary | ICD-10-CM | POA: Diagnosis not present

## 2022-05-22 DIAGNOSIS — F411 Generalized anxiety disorder: Secondary | ICD-10-CM | POA: Diagnosis not present

## 2022-05-22 LAB — ANA W/REFLEX IF POSITIVE: Anti Nuclear Antibody (ANA): NEGATIVE

## 2022-05-22 MED ORDER — ZIPRASIDONE HCL 40 MG PO CAPS
40.0000 mg | ORAL_CAPSULE | Freq: Two times a day (BID) | ORAL | Status: DC
  Administered 2022-05-22 – 2022-05-24 (×4): 40 mg via ORAL
  Filled 2022-05-22 (×6): qty 1

## 2022-05-22 NOTE — Progress Notes (Signed)
Pt in bed this morning, slept through breakfast. Pt medication compliant, pt endorses somnolence related to medications. Physician notified, new orders noted. Pt denies SI currently.  Q 15 minute checks ongoing.

## 2022-05-22 NOTE — Progress Notes (Signed)
The patient attended the evening A.A. speaker's meeting.  

## 2022-05-22 NOTE — [facility] Group Notes (Signed)
Spiritual care group on grief and loss facilitated by chaplain Janne Napoleon, [facility]   Group Goal:   Support / Education around grief and loss   Members engage in facilitated group support and psycho-social education.   Group Description:   Following introductions and group rules, group members engaged in facilitated group dialog and support around topic of loss, with particular support around experiences of loss in their lives. Group Identified types of loss (relationships / self / things) and identified patterns, circumstances, and changes that precipitate losses. Reflected on thoughts / feelings around loss, normalized grief responses, and recognized variety in grief experience. Group noted Worden's four tasks of grief in discussion.   Group drew on Adlerian / Rogerian, narrative, MI,   Patient Progress: Marcus Martinez attended group and actively engaged and participated in group conversation. He shared about some of his coping strategies and was supportive of peers.  [address] [place] Pager, [phone]

## 2022-05-22 NOTE — Progress Notes (Signed)
[facility] MD Progress Note  05/22/2022 12:48 PM Marcus Martinez  MRN:  [id number]  Subjective:    Marcus Martinez is a 31 year old with a past psychiatric history major depressive disorder, anxiety, unspecified mood disorder, ADHD who presented to [facility] voluntarily seeking help for anxiety medication. Patient reported starting new medication x1 week reporting increased mania, inability to focus, and paranoia that the government is taking over.   On exam today, the patient reports worsening daytime fatigue, sedation, dry mouth, increased appetite since increase of Zyprexa dose.  Overall there does not seem to be significant improvement of paranoid symptoms with titration of Zyprexa.  He reports that mood is depressed.  Reports that sleep is increased.  Appetite is increased "I never feel full anymore".  He reports having suicidal thoughts, without intention or plan to harm self in the hospital at this time.  Denies HI.  Denies AH at this time.  Denies VH.  Reports that anxiety level continues to be high, the conflict with mother.  Overall, the patient is  We extensively discussed paranoid delusions. Due to conflict with mother and also content of concerned about his mother criticizing him and taking him out if he tells her that he is gay.  Patient described his mother as very overly critical and overbearing.  We discussed that what the patient wants for his long-term psychiatric help might require him to move out of his mother's house.  He is starting to explore these options.  He is very self-critical and has a lot of shame and guilt regarding his sexuality, which significantly contributes to his depressed mood and suicidal thoughts.    Principal Problem: Schizoaffective disorder, bipolar type (HCC) Diagnosis: Principal Problem:   Schizoaffective disorder, bipolar type (HCC) Active Problems:   Anxiety   Mood disorder of manic type (HCC)   OCD (obsessive compulsive disorder)  Total Time spent with  patient: 30 minutes  Past Psychiatric History:  major depressive disorder, anxiety  Past Medical History:  Past Medical History:  Diagnosis Date   ADHD (attention deficit hyperactivity disorder)    GAD (generalized anxiety disorder)    Major depressive disorder     Past Surgical History:  Procedure Laterality Date   WISDOM TOOTH EXTRACTION     Family History:  Family History  Problem Relation Age of Onset   Colon cancer Neg Hx    Esophageal cancer Neg Hx    Rectal cancer Neg Hx    Stomach cancer Neg Hx    Family Psychiatric  History: schizophrenia- maternal grandmother, bipolar- niece, nephew  Social History:  Social History   Substance and Sexual Activity  Alcohol Use Yes   Comment: occasional     Social History   Substance and Sexual Activity  Drug Use Not Currently   Types: Marijuana    Social History   Socioeconomic History   Marital status: Single    Spouse name: Not on file   Number of children: Not on file   Years of education: Not on file   Highest education level: Not on file  Occupational History   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  Tobacco Use   Smoking status: Every Day    Packs/day: 0.25    Types: Cigarettes   Smokeless tobacco: Never  Vaping Use   Vaping Use: Never used  Substance and Sexual Activity   Alcohol use: Yes    Comment: occasional   Drug use: Not Currently    Types: Marijuana   Sexual activity: Never  Other Topics Concern   Not on file  Social History Narrative   Not on file   Social Determinants of Health   Financial Resource Strain: Low Risk  (11/14/2017)   Overall Financial Resource Strain (CARDIA)    Difficulty of Paying Living Expenses: Not hard at all  Food Insecurity: No Food Insecurity (11/14/2017)   Hunger Vital Sign    Worried About Running Out of Food in the Last Year: Never true    Ran Out of Food in the Last Year: Never true  Transportation Needs: No Transportation Needs (11/14/2017)   PRAPARE -  Administrator, Civil ServiceTransportation    Lack of Transportation (Medical): No    Lack of Transportation (Non-Medical): No  Physical Activity: Inactive (11/14/2017)   Exercise Vital Sign    Days of Exercise per Week: 0 days    Minutes of Exercise per Session: 0 min  Stress: No Stress Concern Present (11/14/2017)   [employer] - Occupational Stress Questionnaire    Feeling of Stress : Not at all  Social Connections: Somewhat Isolated (11/14/2017)   Social Connection and Isolation Panel [NHANES]    Frequency of Communication with Friends and Family: More than three times a week    Frequency of Social Gatherings with Friends and Family: Once a week    Attends Religious Services: More than 4 times per year    Active Member of [employer]ubs or Organizations: No    Attends BankerClub or Organization Meetings: Never    Marital Status: Never married   Additional Social History:                         Sleep: Good  Appetite:  Good  Current Medications: Current Facility-Administered Medications  Medication Dose Route Frequency Provider Last Rate Last Admin   acetaminophen (TYLENOL) tablet 650 mg  650 mg Oral Q6H PRN Rankin, Shuvon B, NP   650 mg at 05/21/22 1201   alum & mag hydroxide-simeth (MAALOX/MYLANTA) 200-200-20 MG/5ML suspension 30 mL  30 mL Oral Q4H PRN Rankin, Shuvon B, NP       amLODipine (NORVASC) tablet 5 mg  5 mg Oral Daily Christoph Copelan, MD   5 mg at 05/22/22 0800   antiseptic oral rinse (BIOTENE) solution 15 mL  15 mL Mouth Rinse PRN Ntuen, Jesusita Okaina C, FNP       cyanocobalamin (VITAMIN B12) tablet 1,000 mcg  1,000 mcg Oral Daily Mason JimSingleton, Amy E, MD   1,000 mcg at 05/22/22 0800   hydrOXYzine (ATARAX) tablet 25 mg  25 mg Oral TID Rankin, Shuvon B, NP   25 mg at 05/22/22 0800   OLANZapine zydis (ZYPREXA) disintegrating tablet 5 mg  5 mg Oral Q8H PRN Samanyu Tinnell, Harrold DonathNathan, MD       And   LORazepam (ATIVAN) tablet 1 mg  1 mg Oral Q6H PRN Boyd Buffalo, MD       And    ziprasidone (GEODON) injection 20 mg  20 mg Intramuscular Q8H PRN Percy Comp, MD       magnesium hydroxide (MILK OF MAGNESIA) suspension 30 mL  30 mL Oral Daily PRN Rankin, Shuvon B, NP       nicotine (NICODERM CQ - dosed in mg/24 hours) patch 21 mg  21 mg Transdermal Daily Leevy-Johnson, Brooke A, NP   21 mg at 05/22/22 0814   traZODone (DESYREL) tablet 50 mg  50 mg Oral QHS PRN Phineas InchesMassengill, Sofi Bryars, MD   50 mg at 05/18/22 2116  ziprasidone (GEODON) capsule 40 mg  40 mg Oral BID WC Mistee Soliman, Ovid Curd, MD        Lab Results:  Results for orders placed or performed during the hospital encounter of 05/17/22 (from the past 48 hour(s))  ANA w/Reflex if Positive     Status: None   Collection Time: 05/20/22  6:29 PM  Result Value Ref Range   Anti Nuclear Antibody (ANA) Negative Negative    Comment: (NOTE) Performed At: [facility] [place], [place] [id number] Rush Farmer MD [id number]     Blood Alcohol level:  Lab Results  Component Value Date   ETH <10 57/32/2025    Metabolic Disorder Labs: No results found for: "HGBA1C", "MPG" No results found for: "PROLACTIN" Lab Results  Component Value Date   CHOL 214 (H) 04/25/2022   TRIG 355 (H) 04/25/2022   HDL 32 (L) 04/25/2022   CHOLHDL 6.7 (H) 04/25/2022   LDLCALC 120 (H) 04/25/2022    Physical Findings: AIMS: Facial and Oral Movements Muscles of Facial Expression: None, normal Lips and Perioral Area: None, normal Jaw: None, normal Tongue: None, normal,Extremity Movements Upper (arms, wrists, hands, fingers): None, normal Lower (legs, knees, ankles, toes): None, normal, Trunk Movements Neck, shoulders, hips: None, normal, Overall Severity Severity of abnormal movements (highest score from questions above): None, normal Incapacitation due to abnormal movements: None, normal Patient's awareness of abnormal movements (rate only patient's report): No Awareness, Dental Status Current problems with  teeth and/or dentures?: No Does patient usually wear dentures?: No  CIWA:    COWS:     Musculoskeletal: Strength & Muscle Tone: within normal limits Gait & Station: normal Patient leans: N/A  Psychiatric Specialty Exam:  Presentation  General Appearance: Casual  Eye Contact:Fair  Speech:Normal Rate  Speech Volume:Normal  Handedness:Right   Mood and Affect  Mood:Depressed; Anxious; Worthless  Affect:Congruent; Full Range   Thought Process  Thought Processes:Linear  Descriptions of Associations:Intact  Orientation:Full (Time, Place and Person)  Thought Content:Paranoid Ideation  History of Schizophrenia/Schizoaffective disorder:No data recorded Duration of Psychotic Symptoms:No data recorded Hallucinations:Hallucinations: -- (Denies today) Description of Visual Hallucinations: -- (Denies today)  Ideas of Reference:Paranoia; Delusions  Suicidal Thoughts:Suicidal Thoughts: Yes, Passive SI Passive Intent and/or Plan: Without Intent; Without Plan  Homicidal Thoughts:Homicidal Thoughts: No   Sensorium  Memory:Immediate Good; Recent Good; Remote Good  Judgment:Fair  Insight:Lacking   Executive Functions  Concentration:Fair  Attention Span:Fair  [place]   Psychomotor Activity  Psychomotor Activity:Psychomotor Activity: Normal   Assets  Assets:Communication Skills; Physical Health; Desire for Improvement   Sleep  Sleep:Sleep: Fair Number of Hours of Sleep: 8    Physical Exam: Physical Exam Vitals reviewed.  Pulmonary:     Effort: Pulmonary effort is normal.  Neurological:     Mental Status: He is alert.     Motor: No weakness.     Gait: Gait normal.    Review of Systems  Constitutional:  Negative for chills and fever.  Cardiovascular:  Negative for chest pain and palpitations.  Neurological:  Negative for dizziness, tingling, tremors and headaches.  Psychiatric/Behavioral:  Positive  for depression, substance abuse and suicidal ideas. The patient is nervous/anxious.    Blood pressure 137/84, pulse (!) 130, temperature 97.8 F (36.6 C), temperature source Oral, resp. rate 20, height 6' (1.829 m), weight 92.1 kg, SpO2 97 %. Body mass index is 27.53 kg/m.   Treatment Plan Summary: Daily contact with patient to assess and evaluate symptoms and progress  in treatment  Assessment: -Schizoaffective disorder bipolar type GAD Tobacco use disorder Vitamin B12 deficiency    PLAN: Safety and Monitoring:  -- Voluntary admission to inpatient psychiatric unit for safety, stabilization and treatment  -- Daily contact with patient to assess and evaluate symptoms and progress in treatment  -- Patient's case to be discussed in multi-disciplinary team meeting  -- Observation Level : q15 minute checks  -- Vital signs:  q12 hours  -- Precautions: suicide, elopement, and assault  2. Psychiatric Diagnoses and Treatment:    -Stop Zyprexa due to side effects and limited efficacy -Start Geodon 40 mg twice daily with meals for schizoaffective disorder bipolar type -Stop metformin, due to patient preference (and also Geodon has less metabolic side effects from Zyprexa) -Continue Norvasc for hypertension  --  The risks/benefits/side-effects/alternatives to this medication were discussed in detail with the patient and time was given for questions. The patient consents to medication trial.     3. Medical Issues Being Addressed:   Tobacco Use Disorder  -- Nicotine patch 21mg /24 hours ordered  -- Smoking cessation encouraged  4. Discharge Planning:   -- Social work and case management to assist with discharge planning and identification of hospital follow-up needs prior to discharge  -- Estimated LOS: 5-7 days  -- Discharge Concerns: Need to establish a safety plan; Medication compliance and effectiveness  -- Discharge Goals: Return home with outpatient referrals for mental health  follow-up including medication management/psychotherapy   02-21-1984, MD 05/22/2022, 12:48 PM   Total Time Spent in Direct Patient Care:  I personally spent 40 minutes on the unit in direct patient care. The direct patient care time included face-to-face time with the patient, reviewing the patient's chart, communicating with other professionals, and coordinating care. Greater than 50% of this time was spent in counseling or coordinating care with the patient regarding goals of hospitalization, psycho-education, and discharge planning needs.   05/24/2022, MD Psychiatrist

## 2022-05-22 NOTE — Group Note (Signed)
LCSW Group Therapy Note   Group Date: 05/22/2022 Start Time: 1300 End Time: 1400  Type of Therapy and Topic: Group Therapy: Overcoming Obstacles  Participation Level: Did Not Attend  Description of Group: In this group patients will be encouraged to explore what they see as obstacles to their own wellness and recovery. They will be guided to discuss their thoughts, feelings, and behaviors related to these obstacles. The group will process together ways to cope with barriers, with attention given to specific choices patients can make. Each patient will be challenged to identify changes they are motivated to make in order to overcome their obstacles. This group will be process-oriented, with patients participating in exploration of their own experiences as well as giving and receiving support and challenge from other group members.  Therapeutic Goals: 1. Patient will identify personal and current obstacles as they relate to admission. 2. Patient will identify barriers that currently interfere with their wellness or overcoming obstacles. 3. Patient will identify feelings, thought process and behaviors related to these barriers. 4. Patient will identify two changes they are willing to make to overcome these obstacles:  Summary of Patient Progress:  Did not attend   Gayleen Sholtz M Arris Meyn, LCSWA 05/22/2022  2:30 PM    

## 2022-05-22 NOTE — [facility] Group Notes (Signed)
Psychoeducational and Goals Group:     Purpose of Group: The groups focus is to identify and establish SMART goal for the shift. Group members participated in discussion about triggers for depression and anxiety. Participants were encouraged to share what they have learned and to actively listen to others.  Participation: Pt actively participated and listened to others   Patient's Goal: "I need to work on my problem with irritability, I'm so sensitive."

## 2022-05-22 NOTE — Progress Notes (Signed)
Marcus Martinez requested a Bible following the group session. Chaplain brought him a Bible, but he was looking for a particular translation which chaplain did not have.  Chaplain assessed for additional needs.  He stated that he did not have any other needs at this time.

## 2022-05-23 DIAGNOSIS — F250 Schizoaffective disorder, bipolar type: Secondary | ICD-10-CM | POA: Diagnosis not present

## 2022-05-23 DIAGNOSIS — F411 Generalized anxiety disorder: Secondary | ICD-10-CM | POA: Diagnosis not present

## 2022-05-23 NOTE — [facility] Suicide Risk Assessment (Signed)
[place] INPATIENT:  Family/Significant Other Suicide Prevention Education  Suicide Prevention Education:  Education Completed; Marcus Martinez [phone] (Half-Sister) has been identified by the patient as the family member/significant other with whom the patient will be residing, and identified as the person(s) who will aid the patient in the event of a mental health crisis (suicidal ideations/suicide attempt).  With written consent from the patient, the family member/significant other has been provided the following suicide prevention education, prior to the and/or following the discharge of the patient.  The suicide prevention education provided includes the following: Suicide risk factors Suicide prevention and interventions National Suicide Hotline telephone number [facility] assessment telephone number [facility] Assistance [place] and/or Residential Mobile Crisis Unit telephone number  Request made of family/significant other to: Remove weapons (e.g., guns, rifles, knives), all items previously/currently identified as safety concern.   Remove drugs/medications (over-the-counter, prescriptions, illicit drugs), all items previously/currently identified as a safety concern.  The family member/significant other verbalizes understanding of the suicide prevention education information provided.  The family member/significant other agrees to remove the items of safety concern listed above.  CSW spoke with Marcus Martinez who states that her brother has been experiencing an unstable mood for several months.  She states that he is easily angered and will also cry easily too.  She states that he lives with their mother but is angry with her.  She states that his mother "does everything for him, so I do not know why he is angry at her".  She states that he has been talking about moving out of his mother's home but has not done it yet.  She states that her brother  often spends a lot of money and has "lost 6 or 7 jobs this year".  She states that as a child her brother was diagnosed with ADHD but she is not aware of any other previous diagnosis.  Marcus Martinez states that her brother can return to his mother's home after discharge.  She states that there are firearms in the home but that they have been locked up by her brother's father.  CSW completed SPE with Marcus Martinez.   Marcus Martinez 05/23/2022, 1:01 PM

## 2022-05-23 NOTE — Progress Notes (Signed)
   05/23/22 1030  Psych Admission Type (Psych Patients Only)  Admission Status Voluntary  Psychosocial Assessment  Patient Complaints None  Eye Contact Fair  Facial Expression Animated  Affect Appropriate to circumstance  Speech Logical/coherent  Interaction Assertive  Motor Activity Fidgety  Appearance/Hygiene Unremarkable  Behavior Characteristics Cooperative  Mood Pleasant  Thought Process  Coherency WDL  Content WDL  Delusions Paranoid  Perception WDL  Hallucination None reported or observed  Judgment Poor  Confusion None  Danger to Self  Current suicidal ideation? Denies  Self-Injurious Behavior No self-injurious ideation or behavior indicators observed or expressed   Agreement Not to Harm Self Yes  Description of Agreement Verbal  Danger to Others  Danger to Others None reported or observed

## 2022-05-23 NOTE — Group Note (Signed)
Recreation Therapy Group Note   Group Topic:Animal Assisted Therapy   Group Date: 05/23/2022 Start Time: 1638 End Time: 1500 Facilitators: Victorino Sparrow, LRT,CTRS Location: [address] Dayroom   Animal-Assisted Activity (AAA) Program Checklist/Progress Notes Patient Eligibility Criteria Checklist & Daily Group note for Rec Tx Intervention  AAA/T Program Assumption of Risk Form signed by Patient/ or Parent Legal Guardian Yes  Patient is free of allergies or severe asthma Yes  Patient reports no fear of animals Yes  Patient reports no history of cruelty to animals Yes  Patient understands his/her participation is voluntary Yes  Patient washes hands before animal contact Yes  Patient washes hands after animal contact Yes   Affect/Mood: Appropriate   Participation Level: Engaged   Participation Quality: Independent   Behavior: Appropriate    Clinical Observations/Individualized Feedback:  Patient attended session and interacted appropriately with therapy dog and peers. Patient asked appropriate questions about therapy dog and his training. Patient shared stories about their pets at home with group.    Plan: Continue to engage patient in RT group sessions 2-3x/week.   Victorino Sparrow, Glennis Brink 05/23/2022 3:39 PM

## 2022-05-23 NOTE — Progress Notes (Signed)
[facility]  05/23/2022 1:13 PM Marcus Martinez  MRN:  [id number]  Subjective:    Marcus Martinez is a 31 year old with a past psychiatric history major depressive disorder, anxiety, unspecified mood disorder, ADHD who [facility] seeking help for anxiety medication. Patient reported starting new medication x1 week reporting increased mania, inability to focus, and paranoia that the government is taking over.   On exam today, the patient continues to report dry mouth and mild sedation but states that these are improved from the day prior. He states that he feels better on Geodon, reporting that he feels less anxious overall. He states that he continues to have some suicidal thoughts, but he has been able to suppress these thoughts by focusing on other things. He states that his mother visited him yesterday evening, which was stressful for him but "as good as it could have been". He reports talking with her about moving out and it was not as catastrophic as he anticipated. Reports that paranoid delusions persist but is not as focused on them, able to concentrate on other things and "can put them to the back of my mind more easy." Reports sleep is still good after stopping zyprexa. Concentration is better. Denying HI, AH, VH.     Principal Problem: Schizoaffective disorder, bipolar type (HCC) Diagnosis: Principal Problem:   Schizoaffective disorder, bipolar type (HCC) Active Problems:   Anxiety   Mood disorder of manic type (HCC)   OCD (obsessive compulsive disorder)  Total Time spent with patient: 30 minutes  Past Psychiatric History:  major depressive disorder, anxiety  Past Medical History:  Past Medical History:  Diagnosis Date   ADHD (attention deficit hyperactivity disorder)    GAD (generalized anxiety disorder)    Major depressive disorder     Past Surgical History:  Procedure Laterality Date   WISDOM TOOTH EXTRACTION     Family History:  Family  History  Problem Relation Age of Onset   Colon cancer Neg Hx    Esophageal cancer Neg Hx    Rectal cancer Neg Hx    Stomach cancer Neg Hx    Family Psychiatric  History: schizophrenia- maternal grandmother, bipolar- niece, nephew  Social History:  Social History   Substance and Sexual Activity  Alcohol Use Yes   Comment: occasional     Social History   Substance and Sexual Activity  Drug Use Not Currently   Types: Marijuana    Social History   Socioeconomic History   Marital status: Single    Spouse name: Not on file   Number of children: Not on file   Years of education: Not on file   Highest education level: Not on file  Occupational History   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[employer]  Tobacco Use   Smoking status: Every Day    Packs/day: 0.25    Types: Cigarettes   Smokeless tobacco: Never  Vaping Use   Vaping Use: Never used  Substance and Sexual Activity   Alcohol use: Yes    Comment: occasional   Drug use: Not Currently    Types: Marijuana   Sexual activity: Never  Other Topics Concern   Not on file  Social History Narrative   Not on file   Social Determinants of Health   Financial Resource Strain: Low Risk  (11/14/2017)   Overall Financial Resource Strain (CARDIA)    Difficulty of Paying Living Expenses: Not hard at all  Food Insecurity: No Food Insecurity (11/14/2017)   Hunger  Vital Sign    Worried About Programme researcher, broadcasting/film/video in the Last Year: Never true    Ran Out of Food in the Last Year: Never true  Transportation Needs: No Transportation Needs (11/14/2017)   PRAPARE - Administrator, Civil Service (Medical): No    Lack of Transportation (Non-Medical): No  Physical Activity: Inactive (11/14/2017)   Exercise Vital Sign    Days of Exercise per Week: 0 days    Minutes of Exercise per Session: 0 min  Stress: No Stress Concern Present (11/14/2017)   [employer] of Occupational Health - Occupational Stress Questionnaire    Feeling of Stress  : Not at all  Social Connections: Somewhat Isolated (11/14/2017)   Social Connection and Isolation Panel [NHANES]    Frequency of Communication with Friends and Family: More than three times a week    Frequency of Social Gatherings with Friends and Family: Once a week    Attends Religious Services: More than 4 times per year    Active Member of [employer] or Organizations: No    Attends Banker Meetings: Never    Marital Status: Never married   Additional Social History:                         Sleep: Good  Appetite:  Good  Current Medications: Current Facility-Administered Medications  Medication Dose Route Frequency Provider Last Rate Last Admin   acetaminophen (TYLENOL) tablet 650 mg  650 mg Oral Q6H PRN Rankin, Shuvon B, NP   650 mg at 05/21/22 1201   alum & mag hydroxide-simeth (MAALOX/MYLANTA) 200-200-20 MG/5ML suspension 30 mL  30 mL Oral Q4H PRN Rankin, Shuvon B, NP       amLODipine (NORVASC) tablet 5 mg  5 mg Oral Daily Violeta Lecount, MD   5 mg at 05/23/22 1014   antiseptic oral rinse (BIOTENE) solution 15 mL  15 mL Mouth Rinse PRN Ntuen, Jesusita Oka, FNP       cyanocobalamin (VITAMIN B12) tablet 1,000 mcg  1,000 mcg Oral Daily Mason Jim, Amy E, MD   1,000 mcg at 05/23/22 1014   hydrOXYzine (ATARAX) tablet 25 mg  25 mg Oral TID Rankin, Shuvon B, NP   25 mg at 05/23/22 1014   OLANZapine zydis (ZYPREXA) disintegrating tablet 5 mg  5 mg Oral Q8H PRN Mee Macdonnell, Harrold Donath, MD       And   LORazepam (ATIVAN) tablet 1 mg  1 mg Oral Q6H PRN Jya Hughston, MD       And   ziprasidone (GEODON) injection 20 mg  20 mg Intramuscular Q8H PRN Giorgio Chabot, MD       magnesium hydroxide (MILK OF MAGNESIA) suspension 30 mL  30 mL Oral Daily PRN Rankin, Shuvon B, NP       nicotine (NICODERM CQ - dosed in mg/24 hours) patch 21 mg  21 mg Transdermal Daily Leevy-Johnson, Brooke A, NP   21 mg at 05/23/22 1016   traZODone (DESYREL) tablet 50 mg  50 mg Oral QHS PRN  Brinsley Wence, Harrold Donath, MD   50 mg at 05/22/22 2131   ziprasidone (GEODON) capsule 40 mg  40 mg Oral BID WC Daliyah Sramek, Harrold Donath, MD   40 mg at 05/23/22 1016    Lab Results:  No results found for this or any previous visit (from the past 48 hour(s)).   Blood Alcohol level:  Lab Results  Component Value Date   ETH <10 05/16/2022  Metabolic Disorder Labs: No results found for: "HGBA1C", "MPG" No results found for: "PROLACTIN" Lab Results  Component Value Date   CHOL 214 (H) 04/25/2022   TRIG 355 (H) 04/25/2022   HDL 32 (L) 04/25/2022   CHOLHDL 6.7 (H) 04/25/2022   LDLCALC 120 (H) 04/25/2022    Physical Findings: AIMS: Facial and Oral Movements Muscles of Facial Expression: None, normal Lips and Perioral Area: None, normal Jaw: None, normal Tongue: None, normal,Extremity Movements Upper (arms, wrists, hands, fingers): None, normal Lower (legs, knees, ankles, toes): None, normal, Trunk Movements Neck, shoulders, hips: None, normal, Overall Severity Severity of abnormal movements (highest score from questions above): None, normal Incapacitation due to abnormal movements: None, normal Patient's awareness of abnormal movements (rate only patient's report): No Awareness, Dental Status Current problems with teeth and/or dentures?: No Does patient usually wear dentures?: No  CIWA:    COWS:     Musculoskeletal: Strength & Muscle Tone: within normal limits Gait & Station: normal Patient leans: N/A  Psychiatric Specialty Exam:  Presentation  General Appearance: Casual  Eye Contact:Fair  Speech:Normal Rate  Speech Volume:Normal  Handedness:Right   Mood and Affect  Mood:Depressed; Anxious; Worthless  Affect:Congruent; Full Range   Thought Process  Thought Processes:Linear  Descriptions of Associations:Intact  Orientation:Full (Time, Place and Person)  Thought Content:Paranoid Ideation  History of Schizophrenia/Schizoaffective disorder:No data  recorded Duration of Psychotic Symptoms:No data recorded Hallucinations:No data recorded denies  Ideas of Reference:Paranoia; Delusions  Suicidal Thoughts:Suicidal Thoughts: Yes, Passive SI Passive Intent and/or Plan: Without Intent; Without Plan  Homicidal Thoughts:Homicidal Thoughts: No   Sensorium  Memory:Immediate Good; Recent Good; Remote Good  Judgment:Fair  Insight:Lacking   Executive Functions  Concentration:Fair  Attention Span:Fair  Recall:Fair  Fund of Knowledge:Fair  Language:Fair   Psychomotor Activity  Psychomotor Activity:Psychomotor Activity: Normal   Assets  Assets:Communication Skills; Physical Health; Desire for Improvement   Sleep  Sleep:Sleep: Fair    Physical Exam: Physical Exam Vitals reviewed.  Pulmonary:     Effort: Pulmonary effort is normal.  Neurological:     Mental Status: He is alert.     Motor: No weakness.     Gait: Gait normal.    Review of Systems  Constitutional:  Negative for chills and fever.  Cardiovascular:  Negative for chest pain and palpitations.  Neurological:  Negative for dizziness, tingling, tremors and headaches.  Psychiatric/Behavioral:  Positive for depression, substance abuse and suicidal ideas. The patient is nervous/anxious.    Blood pressure 117/86, pulse (!) 110, temperature 97.8 F (36.6 C), temperature source Oral, resp. rate 20, height 6' (1.829 m), weight 92.1 kg, SpO2 94 %. Body mass index is 27.53 kg/m.   Treatment Plan Summary: Daily contact with patient to assess and evaluate symptoms and progress in treatment  Assessment: -Schizoaffective disorder bipolar type -GAD -Tobacco use disorder -Vitamin B12 deficiency    PLAN: Safety and Monitoring:  -- Voluntary admission to inpatient psychiatric unit for safety, stabilization and treatment  -- Daily contact with patient to assess and evaluate symptoms and progress in treatment  -- Patient's case to be discussed in  multi-disciplinary team meeting  -- Observation Level : q15 minute checks  -- Vital signs:  q12 hours  -- Precautions: suicide, elopement, and assault  2. Psychiatric Diagnoses and Treatment:    -Continue Geodon 40 mg twice daily with meals for schizoaffective disorder bipolar type -Previously Stopped Zyprexa due to side effects and limited efficacy -Previously stopped metformin, due to patient preference (and also Geodon has less metabolic  side effects from Zyprexa) -Continue Norvasc 5 mg once daily for hypertension  --  The risks/benefits/side-effects/alternatives to this medication were discussed in detail with the patient and time was given for questions. The patient consents to medication trial.     3. Medical Issues Being Addressed:   Tobacco Use Disorder  -- Nicotine patch 21mg /24 hours ordered  -- Smoking cessation encouraged  4. Discharge Planning:   -- Social work and case management to assist with discharge planning and identification of hospital follow-up needs prior to discharge  -- Estimated LOS: 5-7 days  -- Discharge Concerns: Need to establish a safety plan; Medication compliance and effectiveness  -- Discharge Goals: Return home with outpatient referrals for mental health follow-up including medication management/psychotherapy   Elane Fritz, Medical Student 05/23/2022, 1:13 PM  Total Time Spent in Direct Patient Care:  I personally spent 35 minutes on the unit in direct patient care. The direct patient care time included face-to-face time with the patient, reviewing the patient's chart, communicating with other professionals, and coordinating care. Greater than 50% of this time was spent in counseling or coordinating care with the patient regarding goals of hospitalization, psycho-education, and discharge planning needs.  I personally was present and performed or re-performed the history, physical exam and medical decision-making activities of this service and  have verified that the service and findings are accurately documented in the student's note, , as addended by me or notated below:  I directly edited the note, as above. Pt doing better with psychotherapy yesterday and today. Geodon is better for psychotic symptoms and alleviating s/e 2/2 zyprexa. Suicidal thoughts less but persist.   Janine Limbo, MD Psychiatrist

## 2022-05-23 NOTE — Progress Notes (Signed)
Wrap up group: List Positive thing about your day & 1 attribute about yourself Pt attended group as scheduled. Pt was appropriate with insight about discussion. Stated "Pet therapy was good. I'm relying on the higher power through this process".

## 2022-05-24 ENCOUNTER — Encounter (HOSPITAL_COMMUNITY): Payer: Self-pay

## 2022-05-24 DIAGNOSIS — I10 Essential (primary) hypertension: Secondary | ICD-10-CM

## 2022-05-24 DIAGNOSIS — F411 Generalized anxiety disorder: Secondary | ICD-10-CM | POA: Diagnosis not present

## 2022-05-24 DIAGNOSIS — F250 Schizoaffective disorder, bipolar type: Secondary | ICD-10-CM

## 2022-05-24 LAB — HEMOGLOBIN A1C
Hgb A1c MFr Bld: 5.3 % (ref 4.8–5.6)
Mean Plasma Glucose: 105.41 mg/dL

## 2022-05-24 MED ORDER — PROPRANOLOL HCL 10 MG PO TABS
10.0000 mg | ORAL_TABLET | Freq: Two times a day (BID) | ORAL | Status: DC
  Administered 2022-05-24 – 2022-05-26 (×5): 10 mg via ORAL
  Filled 2022-05-24 (×8): qty 1

## 2022-05-24 MED ORDER — BENZONATATE 100 MG PO CAPS: 100.0000 mg | ORAL_CAPSULE | Freq: Two times a day (BID) | ORAL | Status: DC | PRN

## 2022-05-24 MED ORDER — ZIPRASIDONE HCL 60 MG PO CAPS
60.0000 mg | ORAL_CAPSULE | Freq: Two times a day (BID) | ORAL | Status: DC
  Administered 2022-05-24 – 2022-05-26 (×4): 60 mg via ORAL
  Filled 2022-05-24 (×6): qty 1

## 2022-05-24 NOTE — BH IP Treatment Plan (Signed)
Interdisciplinary Treatment and Diagnostic Plan Update  05/24/2022 Time of Session: 0830 Jaze Rodino MRN: [id number]  Principal Diagnosis: Schizoaffective disorder, bipolar type [facility])  Secondary Diagnoses: Principal Problem:   Schizoaffective disorder, bipolar type (HCC) Active Problems:   Anxiety   OCD (obsessive compulsive disorder)   HTN (hypertension)   Current Medications:  Current Facility-Administered Medications  Medication Dose Route Frequency Provider Last Rate Last Admin   acetaminophen (TYLENOL) tablet 650 mg  650 mg Oral Q6H PRN Rankin, Shuvon B, NP   650 mg at 05/21/22 1201   alum & mag hydroxide-simeth (MAALOX/MYLANTA) 200-200-20 MG/5ML suspension 30 mL  30 mL Oral Q4H PRN Rankin, Shuvon B, NP       amLODipine (NORVASC) tablet 5 mg  5 mg Oral Daily Massengill, Nathan, MD   5 mg at 05/24/22 5449   antiseptic oral rinse (BIOTENE) solution 15 mL  15 mL Mouth Rinse PRN Ntuen, Jesusita Oka, FNP       benzonatate (TESSALON) capsule 100 mg  100 mg Oral BID PRN Massengill, Harrold Donath, MD       cyanocobalamin (VITAMIN B12) tablet 1,000 mcg  1,000 mcg Oral Daily Mason Jim, Amy E, MD   1,000 mcg at 05/24/22 2010   hydrOXYzine (ATARAX) tablet 25 mg  25 mg Oral TID Rankin, Shuvon B, NP   25 mg at 05/24/22 0829   OLANZapine zydis (ZYPREXA) disintegrating tablet 5 mg  5 mg Oral Q8H PRN Massengill, Harrold Donath, MD       And   LORazepam (ATIVAN) tablet 1 mg  1 mg Oral Q6H PRN Massengill, Harrold Donath, MD       And   ziprasidone (GEODON) injection 20 mg  20 mg Intramuscular Q8H PRN Massengill, Nathan, MD       magnesium hydroxide (MILK OF MAGNESIA) suspension 30 mL  30 mL Oral Daily PRN Rankin, Shuvon B, NP       nicotine (NICODERM CQ - dosed in mg/24 hours) patch 21 mg  21 mg Transdermal Daily Leevy-Johnson, Brooke A, NP   21 mg at 05/24/22 0829   propranolol (INDERAL) tablet 10 mg  10 mg Oral BID Massengill, Harrold Donath, MD   10 mg at 05/24/22 1008   traZODone (DESYREL) tablet 50 mg  50 mg Oral QHS PRN  Massengill, Harrold Donath, MD   50 mg at 05/22/22 2131   ziprasidone (GEODON) capsule 40 mg  40 mg Oral BID WC Massengill, Harrold Donath, MD   40 mg at 05/24/22 0712   PTA Medications: Medications Prior to Admission  Medication Sig Dispense Refill Last Dose   citalopram (CELEXA) 20 MG tablet Take 20 mg by mouth daily.      fluticasone (FLONASE) 50 MCG/ACT nasal spray Place 1 spray into both nostrils 2 (two) times daily as needed for allergies or rhinitis. (Patient not taking: Reported on 05/16/2022) 16 g 6    hydrOXYzine (ATARAX) 25 MG tablet Take 25 mg by mouth 3 (three) times daily.      hyoscyamine (LEVBID) 0.375 MG 12 hr tablet Take 1 tablet (0.375 mg total) by mouth 2 (two) times daily. (Patient not taking: Reported on 05/16/2022) 180 tablet 4    hyoscyamine (LEVSIN SL) 0.125 MG SL tablet Place 1 tablet (0.125 mg total) under the tongue every 6 (six) hours as needed. (Patient not taking: Reported on 05/16/2022) 30 tablet 2    levocetirizine (XYZAL) 5 MG tablet TAKE 1 TABLET BY MOUTH EVERY EVENING (Patient taking differently: Take 5 mg by mouth every evening.) 90 tablet 1  loperamide (IMODIUM A-D) 2 MG tablet Take 1 tablet (2 mg total) by mouth 4 (four) times daily as needed for diarrhea or loose stools. (Patient not taking: Reported on 05/16/2022) 30 tablet 0    omega-3 acid ethyl esters (LOVAZA) 1 g capsule Take 2 capsules (2 g total) by mouth 2 (two) times daily. (Patient not taking: Reported on 05/16/2022) 90 capsule 1     Patient Stressors:    Patient Strengths:    Treatment Modalities: Medication Management, Group therapy, Case management,  1 to 1 session with clinician, Psychoeducation, Recreational therapy.   Physician Treatment Plan for Primary Diagnosis: Schizoaffective disorder, bipolar type ([place]) Long Term Goal(s): Improvement in symptoms so as ready for discharge   Short Term Goals: Ability to identify changes in lifestyle to reduce recurrence of condition will improve Ability to  verbalize feelings will improve Ability to disclose and discuss suicidal ideas Ability to demonstrate self-control will improve Ability to identify and develop effective coping behaviors will improve Ability to maintain clinical measurements within normal limits will improve Compliance with prescribed medications will improve  Medication Management: Evaluate patient's response, side effects, and tolerance of medication regimen.  Therapeutic Interventions: 1 to 1 sessions, Unit Group sessions and Medication administration.  Evaluation of Outcomes: Progressing  Physician Treatment Plan for Secondary Diagnosis: Principal Problem:   Schizoaffective disorder, bipolar type ([place]) Active Problems:   Anxiety   OCD (obsessive compulsive disorder)   HTN (hypertension)  Long Term Goal(s): Improvement in symptoms so as ready for discharge   Short Term Goals: Ability to identify changes in lifestyle to reduce recurrence of condition will improve Ability to verbalize feelings will improve Ability to disclose and discuss suicidal ideas Ability to demonstrate self-control will improve Ability to identify and develop effective coping behaviors will improve Ability to maintain clinical measurements within normal limits will improve Compliance with prescribed medications will improve     Medication Management: Evaluate patient's response, side effects, and tolerance of medication regimen.  Therapeutic Interventions: 1 to 1 sessions, Unit Group sessions and Medication administration.  Evaluation of Outcomes: Progressing   RN Treatment Plan for Primary Diagnosis: Schizoaffective disorder, bipolar type ([place]) Long Term Goal(s): Knowledge of disease and therapeutic regimen to maintain health will improve  Short Term Goals: Ability to remain free from injury will improve, Ability to verbalize frustration and anger appropriately will improve, Ability to demonstrate self-control, Ability to participate  in decision making will improve, Ability to verbalize feelings will improve, Ability to disclose and discuss suicidal ideas, Ability to identify and develop effective coping behaviors will improve, and Compliance with prescribed medications will improve  Medication Management: RN will administer medications as ordered by provider, will assess and evaluate patient's response and provide education to patient for prescribed medication. RN will report any adverse and/or side effects to prescribing provider.  Therapeutic Interventions: 1 on 1 counseling sessions, Psychoeducation, Medication administration, Evaluate responses to treatment, Monitor vital signs and CBGs as ordered, Perform/monitor CIWA, COWS, AIMS and Fall Risk screenings as ordered, Perform wound care treatments as ordered.  Evaluation of Outcomes: Progressing   LCSW Treatment Plan for Primary Diagnosis: Schizoaffective disorder, bipolar type ([place]) Long Term Goal(s): Safe transition to appropriate next level of care at discharge, Engage patient in therapeutic group addressing interpersonal concerns.  Short Term Goals: Engage patient in aftercare planning with referrals and resources, Increase social support, Increase ability to appropriately verbalize feelings, Increase emotional regulation, Facilitate acceptance of mental health diagnosis and concerns, Facilitate patient progression through stages of  change regarding substance use diagnoses and concerns, Identify triggers associated with mental health/substance abuse issues, and Increase skills for wellness and recovery  Therapeutic Interventions: Assess for all discharge needs, 1 to 1 time with Social worker, Explore available resources and support systems, Assess for adequacy in community support network, Educate family and significant other(s) on suicide prevention, Complete Psychosocial Assessment, Interpersonal group therapy.  Evaluation of Outcomes: Progressing   Progress in  Treatment: Attending groups: No. Participating in groups: No. Taking medication as prescribed: Yes. Toleration medication: Yes. Family/Significant other contact made: Yes, individual(s) contacted:  Maryann Alar [phone] (Half-Sister) Patient understands diagnosis: Yes. Discussing patient identified problems/goals with staff: Yes. Medical problems stabilized or resolved: Yes. Denies suicidal/homicidal ideation: No. Issues/concerns per patient self-inventory: Yes. Other: none  New problem(s) identified: No, Describe:  none  New Short Term/Long Term Goal(s): Patient to work towards medication management for mood stabilization; elimination of SI thoughts; development of comprehensive mental wellness plan.  Patient Goals:  No additional goals identified at this time. Patient to continue to work towards original goals identified in initial treatment team meeting. CSW will remain available to patient should they voice additional treatment goals.   Discharge Plan or Barriers: No psychosocial barriers identified at this time, patient to return to place of residence when appropriate for discharge.   Reason for Continuation of Hospitalization: Depression Suicidal ideation  Estimated Length of Stay: 1-7 days    Last [facility] 2/9 Scores:    04/25/2022    3:43 PM 10/25/2020    9:27 AM 06/29/2018    8:58 AM  Depression screen PHQ 2/9  Decreased Interest 3 0 0  Down, Depressed, Hopeless 3 0 0  PHQ - 2 Score 6 0 0  Altered sleeping 3    Tired, decreased energy 0    Change in appetite 3    Feeling bad or failure about yourself  3    Trouble concentrating 0    Moving slowly or fidgety/restless 2    Suicidal thoughts 0    PHQ-9 Score 17    Difficult doing work/chores Extremely dIfficult      Scribe for Treatment Team: Corky Crafts, Theresia Majors 05/24/2022 10:14 AM

## 2022-05-24 NOTE — Progress Notes (Signed)
Sister called and reported that he was groggy last night during visitation. Sister reports that she does not think he is ready to come home yet.

## 2022-05-24 NOTE — Group Note (Signed)
Recreation Therapy Group Note   Group Topic:Stress Management  Group Date: 05/24/2022 Start Time: 0930 End Time: 1000 Facilitators: Victorino Sparrow, LRT,CTRS Location: [address] Dayroom   Goal Area(s) Addresses:  Patient will actively participate in stress management techniques presented during session.  Patient will successfully identify benefit of practicing stress management post d/c.   Group Description: Guided Imagery. LRT provided education, instruction, and demonstration on practice of visualization via guided imagery. Patient was asked to participate in the technique introduced during session. LRT debriefed including topics of mindfulness, stress management and specific scenarios each patient could use these techniques. Patients were given suggestions of ways to access scripts post d/c and encouraged to explore Youtube and other apps available on smartphones, tablets, and computers.  Clinical Observations/Feedback:  LRT was unable to hold group due to miscommunication with MHT about group time.   Plan: Continue to engage patient in RT group sessions 2-3x/week.   Victorino Sparrow, Glennis Brink 05/24/2022 12:36 PM

## 2022-05-24 NOTE — Progress Notes (Signed)
Adult Psychoeducational Group Note  Date:  05/24/2022 Time:  11:22 AM  Group Topic/Focus:  Goals Group:   The focus of this group is to help patients establish daily goals to achieve during treatment and discuss how the patient can incorporate goal setting into their daily lives to aide in recovery.  Participation Level:  Active  Participation Quality:  Appropriate  Affect:  Appropriate  Cognitive:  Appropriate  Insight: Appropriate  Engagement in Group:  Engaged  Modes of Intervention:  Discussion Patient attended morning orientation/goal setting group and participated. Additional Comments:    [name] 10/10/154, 11:22 AM

## 2022-05-24 NOTE — Progress Notes (Signed)
Patient appears anxious. Patient denies SI/HI/AVH. Patient complied with morning medication with no reported side effects.  Pt reports fair sleep and appetite. Patient remains safe on Q25min checks and contracts for safety.        05/24/22 1287  Psych Admission Type (Psych Patients Only)  Admission Status Voluntary  Psychosocial Assessment  Patient Complaints Worrying  Eye Contact Fair  Facial Expression Anxious;Animated  Affect Anxious;Appropriate to circumstance  Speech Logical/coherent  Interaction Assertive  Motor Activity Fidgety  Appearance/Hygiene Unremarkable  Behavior Characteristics Cooperative;Anxious  Mood Anxious;Sad  Thought Process  Coherency WDL  Content WDL  Delusions None reported or observed  Perception WDL  Hallucination None reported or observed  Judgment Poor  Confusion None  Danger to Self  Current suicidal ideation? Denies  Self-Injurious Behavior No self-injurious ideation or behavior indicators observed or expressed   Agreement Not to Harm Self Yes  Description of Agreement verbal  Danger to Others  Danger to Others None reported or observed

## 2022-05-24 NOTE — Progress Notes (Signed)
   05/24/22 2200  Psych Admission Type (Psych Patients Only)  Admission Status Voluntary  Psychosocial Assessment  Patient Complaints Anxiety;Worrying  Eye Contact Fair  Facial Expression Anxious;Animated  Affect Anxious  Speech Logical/coherent  Interaction Assertive  Motor Activity Slow  Appearance/Hygiene Unremarkable  Behavior Characteristics Cooperative  Mood Anxious;Sad  Aggressive Behavior  Effect No apparent injury  Thought Process  Coherency WDL  Content WDL  Delusions WDL  Perception WDL  Hallucination None reported or observed  Judgment WDL  Confusion None  Danger to Self  Current suicidal ideation? Denies  Danger to Others  Danger to Others None reported or observed

## 2022-05-24 NOTE — Plan of Care (Signed)
  Problem: Education: Goal: Emotional status will improve Outcome: Progressing   Problem: Coping: Goal: Ability to demonstrate self-control will improve Outcome: Progressing   Problem: Coping: Goal: Coping ability will improve Outcome: Progressing   Problem: Self-Concept: Goal: Will verbalize positive feelings about self Outcome: Progressing

## 2022-05-24 NOTE — Progress Notes (Addendum)
[facility] MD Progress Note  05/24/2022 11:08 AM Marcus Martinez  MRN:  [id number]  Subjective:    Marcus Martinez is a 31 year old with a past psychiatric history major depressive disorder, anxiety, unspecified mood disorder, ADHD who presented to [facility] voluntarily seeking help for anxiety medication. Patient reported starting new medication x1 week reporting increased mania, inability to focus, and paranoia that the government is taking over.   Diagnoses treated during current admission: -Schizoaffective disorder bipolar type -GAD  Yesterday, the psychiatry team made the following recommendations:  -Continue Geodon 40 mg twice daily with meals for schizoaffective disorder bipolar type -Previously Stopped Zyprexa due to side effects and limited efficacy -Previously stopped metformin, due to patient preference (and also Geodon has less metabolic side effects from Zyprexa) -Continue Norvasc 5 mg once daily for hypertension  On exam today, the patient reports less dry mouth and less mild sedation since stopping zyprexa. He states that he feels better on Geodon, reporting that he feels less anxious overall. He reports his sister visited yesterday, which was distressing for him because he feels that she is overbearing and does not approve of his lifestyle. He states that he got in a disagreement with his sister about how long he should remain in the hospital and whether he should move out of his mother's house, and this disagreement ended in him lashing out and making statements about suicide. He states that today he feels much better and his suicidal thoughts are closer to baseline where he is able to suppress them by focusing on other things. He expressed to nursing staff that he was remorseful for what he said and how he acted towards his sister, asking if he could call her and apologize.  Reports sleep is still good after stopping zyprexa. Concentration is better. Denying HI, AH, VH.     Principal  Problem: Schizoaffective disorder, bipolar type (HCC) Diagnosis: Principal Problem:   Schizoaffective disorder, bipolar type (HCC) Active Problems:   Anxiety   OCD (obsessive compulsive disorder)   HTN (hypertension)  Total Time spent with patient: 30 minutes  Past Psychiatric History:  major depressive disorder, anxiety  Past Medical History:  Past Medical History:  Diagnosis Date   ADHD (attention deficit hyperactivity disorder)    GAD (generalized anxiety disorder)    Major depressive disorder     Past Surgical History:  Procedure Laterality Date   WISDOM TOOTH EXTRACTION     Family History:  Family History  Problem Relation Age of Onset   Colon cancer Neg Hx    Esophageal cancer Neg Hx    Rectal cancer Neg Hx    Stomach cancer Neg Hx    Family Psychiatric  History: schizophrenia- maternal grandmother, bipolar- niece, nephew  Social History:  Social History   Substance and Sexual Activity  Alcohol Use Yes   Comment: occasional     Social History   Substance and Sexual Activity  Drug Use Not Currently   Types: Marijuana    Social History   Socioeconomic History   Marital status: Single    Spouse name: Not on file   Number of children: Not on file   Years of education: Not on file   Highest education level: Not on file  Occupational History   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  Tobacco Use   Smoking status: Every Day    Packs/day: 0.25    Types: Cigarettes   Smokeless tobacco: Never  Vaping Use   Vaping Use: Never used  Substance and  Sexual Activity   Alcohol use: Yes    Comment: occasional   Drug use: Not Currently    Types: Marijuana   Sexual activity: Never  Other Topics Concern   Not on file  Social History Narrative   Not on file   Social Determinants of Health   Financial Resource Strain: Low Risk  (11/14/2017)   Overall Financial Resource Strain (CARDIA)    Difficulty of Paying Living Expenses: Not hard at all  Food Insecurity: No  Food Insecurity (11/14/2017)   Hunger Vital Sign    Worried About Running Out of Food in the Last Year: Never true    Ran Out of Food in the Last Year: Never true  Transportation Needs: No Transportation Needs (11/14/2017)   PRAPARE - Administrator, Civil Service (Medical): No    Lack of Transportation (Non-Medical): No  Physical Activity: Inactive (11/14/2017)   Exercise Vital Sign    Days of Exercise per Week: 0 days    Minutes of Exercise per Session: 0 min  Stress: No Stress Concern Present (11/14/2017)   [employer] of Occupational Health - Occupational Stress Questionnaire    Feeling of Stress : Not at all  Social Connections: Somewhat Isolated (11/14/2017)   Social Connection and Isolation Panel [NHANES]    Frequency of Communication with Friends and Family: More than three times a week    Frequency of Social Gatherings with Friends and Family: Once a week    Attends Religious Services: More than 4 times per year    Active Member of [employer] or Organizations: No    Attends Banker Meetings: Never    Marital Status: Never married   Additional Social History:                         Sleep: Good  Appetite:  Good  Current Medications: Current Facility-Administered Medications  Medication Dose Route Frequency Provider Last Rate Last Admin   acetaminophen (TYLENOL) tablet 650 mg  650 mg Oral Q6H PRN Rankin, Shuvon B, NP   650 mg at 05/21/22 1201   alum & mag hydroxide-simeth (MAALOX/MYLANTA) 200-200-20 MG/5ML suspension 30 mL  30 mL Oral Q4H PRN Rankin, Shuvon B, NP       amLODipine (NORVASC) tablet 5 mg  5 mg Oral Daily Cristela Stalder, MD   5 mg at 05/24/22 4742   antiseptic oral rinse (BIOTENE) solution 15 mL  15 mL Mouth Rinse PRN Ntuen, Jesusita Oka, FNP       benzonatate (TESSALON) capsule 100 mg  100 mg Oral BID PRN Marwa Fuhrman, Harrold Donath, MD       cyanocobalamin (VITAMIN B12) tablet 1,000 mcg  1,000 mcg Oral Daily Mason Jim, Amy E, MD   1,000  mcg at 05/24/22 5956   hydrOXYzine (ATARAX) tablet 25 mg  25 mg Oral TID Rankin, Shuvon B, NP   25 mg at 05/24/22 0829   OLANZapine zydis (ZYPREXA) disintegrating tablet 5 mg  5 mg Oral Q8H PRN Janeva Peaster, Harrold Donath, MD       And   LORazepam (ATIVAN) tablet 1 mg  1 mg Oral Q6H PRN Kialee Kham, MD       And   ziprasidone (GEODON) injection 20 mg  20 mg Intramuscular Q8H PRN Muriel Hannold, MD       magnesium hydroxide (MILK OF MAGNESIA) suspension 30 mL  30 mL Oral Daily PRN Rankin, Shuvon B, NP       nicotine (NICODERM CQ -  dosed in mg/24 hours) patch 21 mg  21 mg Transdermal Daily Leevy-Johnson, Brooke A, NP   21 mg at 05/24/22 0829   propranolol (INDERAL) tablet 10 mg  10 mg Oral BID Lynna Zamorano, Harrold DonathNathan, MD   10 mg at 05/24/22 1008   traZODone (DESYREL) tablet 50 mg  50 mg Oral QHS PRN Sorrel Cassetta, Harrold DonathNathan, MD   50 mg at 05/22/22 2131   ziprasidone (GEODON) capsule 40 mg  40 mg Oral BID WC Arina Torry, Harrold DonathNathan, MD   40 mg at 05/24/22 [id number]    Lab Results:  No results found for this or any previous visit (from the past 48 hour(s)).   Blood Alcohol level:  Lab Results  Component Value Date   ETH <10 05/16/2022    Metabolic Disorder Labs: No results found for: "HGBA1C", "MPG" No results found for: "PROLACTIN" Lab Results  Component Value Date   CHOL 214 (H) 04/25/2022   TRIG 355 (H) 04/25/2022   HDL 32 (L) 04/25/2022   CHOLHDL 6.7 (H) 04/25/2022   LDLCALC 120 (H) 04/25/2022    Physical Findings: AIMS: Facial and Oral Movements Muscles of Facial Expression: None, normal Lips and Perioral Area: None, normal Jaw: None, normal Tongue: None, normal,Extremity Movements Upper (arms, wrists, hands, fingers): None, normal Lower (legs, knees, ankles, toes): None, normal, Trunk Movements Neck, shoulders, hips: None, normal, Overall Severity Severity of abnormal movements (highest score from questions above): None, normal Incapacitation due to abnormal movements: None,  normal Patient's awareness of abnormal movements (rate only patient's report): No Awareness, Dental Status Current problems with teeth and/or dentures?: No Does patient usually wear dentures?: No  CIWA:    COWS:     Musculoskeletal: Strength & Muscle Tone: within normal limits Gait & Station: normal Patient leans: N/A  Psychiatric Specialty Exam:  Presentation  General Appearance: Casual  Eye Contact:Fair  Speech:Normal Rate  Speech Volume:Normal  Handedness:Right   Mood and Affect  Mood:Depressed; Anxious; Worthless  Affect:Congruent; Full Range   Thought Process  Thought Processes:Linear  Descriptions of Associations:Intact  Orientation:Full (Time, Place and Person)  Thought Content:Paranoid Ideation  History of Schizophrenia/Schizoaffective disorder:No data recorded Duration of Psychotic Symptoms:No data recorded Hallucinations:No data recorded denies  Ideas of Reference:Paranoia; Delusions  Suicidal Thoughts:No data recorded Reports less SI at this time, still occurring off and on, w/o intent or plan   Homicidal Thoughts:No data recorded Denies   Sensorium  Memory:Immediate Good; Recent Good; Remote Good  Judgment:Fair  Insight:Lacking   Executive Functions  Concentration:Fair  Attention Span:Fair  Recall:Fair  Fund of Knowledge:Fair  Language:Fair   Psychomotor Activity  Psychomotor Activity:No data recorded   Assets  Assets:Communication Skills; Physical Health; Desire for Improvement   Sleep  Sleep:No data recorded okay   Physical Exam: Physical Exam Vitals reviewed.  Pulmonary:     Effort: Pulmonary effort is normal.  Neurological:     Mental Status: He is alert.     Motor: No weakness.     Gait: Gait normal.    Review of Systems  Constitutional:  Negative for chills and fever.  Cardiovascular:  Negative for chest pain and palpitations.  Neurological:  Negative for dizziness, tingling, tremors and headaches.   Psychiatric/Behavioral:  Positive for depression, substance abuse and suicidal ideas. The patient is nervous/anxious.    Blood pressure (!) 150/95, pulse (!) 103, temperature 98 F (36.7 C), temperature source Oral, resp. rate 20, height 6' (1.829 m), weight 92.1 kg, SpO2 96 %. Body mass index is 27.53 kg/m.   Treatment Plan  Summary: Daily contact with patient to assess and evaluate symptoms and progress in treatment  Assessment: -Schizoaffective disorder bipolar type -GAD -Tobacco use disorder -Vitamin B12 deficiency    PLAN: Safety and Monitoring:  -- Voluntary admission to inpatient psychiatric unit for safety, stabilization and treatment  -- Daily contact with patient to assess and evaluate symptoms and progress in treatment  -- Patient's case to be discussed in multi-disciplinary team meeting  -- Observation Level : q15 minute checks  -- Vital signs:  q12 hours  -- Precautions: suicide, elopement, and assault  2. Psychiatric Diagnoses and Treatment:    -Increase Geodon to 60 mg twice daily with meals for schizoaffective disorder bipolar type -Previously Stopped Zyprexa due to side effects and limited efficacy -Previously stopped metformin, due to patient preference (and also Geodon has less metabolic side effects from Zyprexa) -Continue Norvasc 5 mg once daily for hypertension  --  The risks/benefits/side-effects/alternatives to this medication were discussed in detail with the patient and time was given for questions. The patient consents to medication trial.     3. Medical Issues Being Addressed:   Tobacco Use Disorder  -- Nicotine patch 21mg /24 hours ordered  -- Smoking cessation encouraged  4. Discharge Planning:   -- Social work and case management to assist with discharge planning and identification of hospital follow-up needs prior to discharge  -- Estimated LOS: 5-7 days  -- Discharge Concerns: Need to establish a safety plan; Medication compliance and  effectiveness  -- Discharge Goals: Return home with outpatient referrals for mental health follow-up including medication management/psychotherapy   Elane Fritz, Medical Student 05/24/2022, 11:08 AM   Total Time Spent in Direct Patient Care:  I personally spent 35 minutes on the unit in direct patient care. The direct patient care time included face-to-face time with the patient, reviewing the patient's chart, communicating with other professionals, and coordinating care. Greater than 50% of this time was spent in counseling or coordinating care with the patient regarding goals of hospitalization, psycho-education, and discharge planning needs.  I personally was present and performed or re-performed the history, physical exam and medical decision-making activities of this service and have verified that the service and findings are accurately documented in the student's note, , as addended by me or notated below:  Pt reports not being able to contain delusional thoughts and paranoia last night during visit with sister, talking about government spying and bugs. He reports SI has decreased in intensity since last night (but still occurring now w/o intent or plan).   I agree with increasing geodon for mood stabilization and psychotic symptoms (delusions and paranoia).   Janine Limbo, MD Psychiatrist

## 2022-05-24 NOTE — Progress Notes (Signed)
Adult Psychoeducational Group Note  Date:  05/24/2022 Time:  8:13 PM  Group Topic/Focus:  Wrap-Up Group:   The focus of this group is to help patients review their daily goal of treatment and discuss progress on daily workbooks.  Participation Level:  Active  Participation Quality:  Appropriate  Affect:  Appropriate  Cognitive:  Appropriate  Insight: Appropriate  Engagement in Group:  Engaged  Modes of Intervention:  Discussion  Additional Comments:  Attend NA warp up group.  [name] 05/24/2022, 8:13 PM

## 2022-05-24 NOTE — Group Note (Signed)
LCSW Group Therapy Note   Group Date: 05/24/2022 Start Time: 1300 End Time: 1400  Type of Therapy and Topic: Group Therapy: Anger Management   Participation Level:  Did Not Attend  Description of Group: In this group, patients will learn helpful strategies and techniques to manage anger, express anger in alternative ways, change hostile attitudes, and prevent aggressive acts, such as verbal abuse and violence.This group will be process-oriented and eductional, with patients participating in exploration of their own experiences as well as giving and receiving support and challenge from other group members.  Therapeutic Goals: Patient will learn to manage anger. Patient will learn to stop violence or the threat of violence. Patient will learn to develop self control over thoughts and actions. Patient will receive support and feedback from others  Summary of Patient Progress:  Did not attend    Therapeutic Modalities: Cognitive Behavioral Therapy Solution Focused Therapy Motivational Interviewing  Darleen Crocker, Latanya Presser 05/24/2022  1:59 PM

## 2022-05-24 NOTE — Plan of Care (Signed)
  Problem: Education: Goal: Knowledge of [facility] Education information/materials will improve Outcome: Progressing Goal: Emotional status will improve Outcome: Progressing Goal: Mental status will improve Outcome: Progressing Goal: Verbalization of understanding the information provided will improve Outcome: Progressing   Problem: Activity: Goal: Interest or engagement in activities will improve Outcome: Progressing Goal: Sleeping patterns will improve Outcome: Progressing   Problem: Coping: Goal: Ability to verbalize frustrations and anger appropriately will improve Outcome: Progressing Goal: Ability to demonstrate self-control will improve Outcome: Progressing   Problem: Health Behavior/Discharge Planning: Goal: Identification of resources available to assist in meeting health care needs will improve Outcome: Progressing Goal: Compliance with treatment plan for underlying cause of condition will improve Outcome: Progressing   Problem: Physical Regulation: Goal: Ability to maintain clinical measurements within normal limits will improve Outcome: Progressing   Problem: Safety: Goal: Periods of time without injury will increase Outcome: Progressing   Problem: Activity: Goal: Will verbalize the importance of balancing activity with adequate rest periods Outcome: Progressing   Problem: Education: Goal: Will be free of psychotic symptoms Outcome: Progressing Goal: Knowledge of the prescribed therapeutic regimen will improve Outcome: Progressing   Problem: Coping: Goal: Coping ability will improve Outcome: Progressing Goal: Will verbalize feelings Outcome: Progressing   Problem: Health Behavior/Discharge Planning: Goal: Compliance with prescribed medication regimen will improve Outcome: Progressing   Problem: Nutritional: Goal: Ability to achieve adequate nutritional intake will improve Outcome: Progressing   Problem: Role Relationship: Goal:  Ability to communicate needs accurately will improve Outcome: Progressing Goal: Ability to interact with others will improve Outcome: Progressing   Problem: Safety: Goal: Ability to redirect hostility and anger into socially appropriate behaviors will improve Outcome: Progressing Goal: Ability to remain free from injury will improve Outcome: Progressing   Problem: Self-Care: Goal: Ability to participate in self-care as condition permits will improve Outcome: Progressing   Problem: Self-Concept: Goal: Will verbalize positive feelings about self Outcome: Progressing   Problem: Education: Goal: Ability to state activities that reduce stress will improve Outcome: Progressing   Problem: Coping: Goal: Ability to identify and develop effective coping behavior will improve Outcome: Progressing   Problem: Self-Concept: Goal: Ability to identify factors that promote anxiety will improve Outcome: Progressing Goal: Level of anxiety will decrease Outcome: Progressing Goal: Ability to modify response to factors that promote anxiety will improve Outcome: Progressing

## 2022-05-24 NOTE — Plan of Care (Signed)
  Problem: Education: Goal: Emotional status will improve 05/24/2022 0039 by Alita Chyle, RN Outcome: Progressing 05/24/2022 0007 by Alita Chyle, RN Outcome: Progressing   Problem: Coping: Goal: Ability to demonstrate self-control will improve 05/24/2022 0039 by Alita Chyle, RN Outcome: Progressing 05/24/2022 0007 by Alita Chyle, RN Outcome: Progressing   Problem: Coping: Goal: Coping ability will improve 05/24/2022 0039 by Alita Chyle, RN Outcome: Progressing 05/24/2022 0007 by Alita Chyle, RN Outcome: Progressing   Problem: Self-Concept: Goal: Will verbalize positive feelings about self Outcome: Progressing   Problem: Education: Goal: Ability to state activities that reduce stress will improve Outcome: Progressing

## 2022-05-25 DIAGNOSIS — F250 Schizoaffective disorder, bipolar type: Secondary | ICD-10-CM | POA: Diagnosis not present

## 2022-05-25 NOTE — Group Note (Signed)
Date:  05/25/2022 Time:  10:37 AM  Group Topic/Focus:  Orientation:   The focus of this group is to educate the patient on the purpose and policies of crisis stabilization and provide a format to answer questions about their admission.  The group details unit policies and expectations of patients while admitted.    Participation Level:  Active  Participation Quality:  Appropriate  Affect:  Appropriate  Cognitive:  Appropriate  Insight: Appropriate  Engagement in Group:  Engaged  Modes of Intervention:  Discussion   Additional Comments:    Jerrye Beavers 05/25/2022, 10:37 AM

## 2022-05-25 NOTE — Progress Notes (Signed)
Pt attending scheduled groups and activities. Pt reports stable mood, denies SIHI/self harm thoughts as well as a/v hallucinations. Pt reports planning for upcoming discharge tomorrow. Q 15 minute checks ongoing for safety.

## 2022-05-25 NOTE — Progress Notes (Addendum)
[facility]  05/25/2022 2:16 PM Marcus CofferBrandon Sizemore  MRN:  [id number]  Subjective:    Marcus Martinez is a 31 year old with a past psychiatric history major depressive disorder, anxiety, unspecified mood disorder, ADHD who presented to [facility] voluntarily seeking help for anxiety medication. Patient reported starting new medication x1 week reporting increased mania, inability to focus, and paranoia that the government is taking over.   Diagnoses treated during current admission: -Schizoaffective disorder bipolar type -GAD  Yesterday, the psychiatry team made the following recommendations:  -Continue Geodon 60 mg twice daily with meals for schizoaffective disorder bipolar type -Previously Stopped Zyprexa due to side effects and limited efficacy -Previously stopped metformin, due to patient preference (and also Geodon has less metabolic side effects from Zyprexa) -Continue Norvasc 5 mg once daily for hypertension -Continue propranolol 10 mg BID for hypertension and tachycardia  On exam today, the patient reports less dry mouth and less mild sedation since stopping zyprexa. He states that he feels better on Geodon, reporting that he feels less anxious overall. He endorses some vague suicidal thoughts but states that he is able to suppress these by thinking about other things. Reports sleep is still good after stopping zyprexa. Concentration is better. Denying HI, AH, VH.   Upon evaluation today patient presents reporting admission to the hospital was triggered by him feeling "paranoid about the government spying on asked with the new technologies" he notes since in the hospital and started his medications he does not feel paranoid any longer "not at all" he reports improved mood as well and denies any feeling depressed or anxious but occasionally reports feeling restless.  He denies side effect to medications except for reporting some morning grogginess but he does reported to be improving  gradually, he agrees to comply with medications after discharge.  He denies symptoms and does not display any signs of EPS or TD.  Discussed with patient need to follow-up with his primary care provider after discharge for further follow-up for hypertension.  Principal Problem: Schizoaffective disorder, bipolar type (HCC) Diagnosis: Principal Problem:   Schizoaffective disorder, bipolar type (HCC) Active Problems:   Anxiety   OCD (obsessive compulsive disorder)   HTN (hypertension)  Total Time spent with patient: 30 minutes  Past Psychiatric History:  major depressive disorder, anxiety  Past Medical History:  Past Medical History:  Diagnosis Date   ADHD (attention deficit hyperactivity disorder)    GAD (generalized anxiety disorder)    Major depressive disorder     Past Surgical History:  Procedure Laterality Date   WISDOM TOOTH EXTRACTION     Family History:  Family History  Problem Relation Age of Onset   Colon cancer Neg Hx    Esophageal cancer Neg Hx    Rectal cancer Neg Hx    Stomach cancer Neg Hx    Family Psychiatric  History: schizophrenia- maternal grandmother, bipolar- niece, nephew  Social History:  Social History   Substance and Sexual Activity  Alcohol Use Yes   Comment: occasional     Social History   Substance and Sexual Activity  Drug Use Not Currently   Types: Marijuana    Social History   Socioeconomic History   Marital status: Single    Spouse name: Not on file   Number of children: Not on file   Years of education: Not on file   Highest education level: Not on file  Occupational History   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[employer]  Tobacco Use   Smoking status:  Every Day    Packs/day: 0.25    Types: Cigarettes   Smokeless tobacco: Never  Vaping Use   Vaping Use: Never used  Substance and Sexual Activity   Alcohol use: Yes    Comment: occasional   Drug use: Not Currently    Types: Marijuana   Sexual activity: Never  Other Topics  Concern   Not on file  Social History Narrative   Not on file   Social Determinants of Health   Financial Resource Strain: Low Risk  (11/14/2017)   Overall Financial Resource Strain (CARDIA)    Difficulty of Paying Living Expenses: Not hard at all  Food Insecurity: No Food Insecurity (11/14/2017)   Hunger Vital Sign    Worried About Running Out of Food in the Last Year: Never true    Ran Out of Food in the Last Year: Never true  Transportation Needs: No Transportation Needs (11/14/2017)   PRAPARE - Administrator, Civil Service (Medical): No    Lack of Transportation (Non-Medical): No  Physical Activity: Inactive (11/14/2017)   Exercise Vital Sign    Days of Exercise per Week: 0 days    Minutes of Exercise per Session: 0 min  Stress: No Stress Concern Present (11/14/2017)   [employer] of Occupational Health - Occupational Stress Questionnaire    Feeling of Stress : Not at all  Social Connections: Somewhat Isolated (11/14/2017)   Social Connection and Isolation Panel [NHANES]    Frequency of Communication with Friends and Family: More than three times a week    Frequency of Social Gatherings with Friends and Family: Once a week    Attends Religious Services: More than 4 times per year    Active Member of [employer] or Organizations: No    Attends Banker Meetings: Never    Marital Status: Never married   Additional Social History:                         Sleep: Good  Appetite:  Good  Current Medications: Current Facility-Administered Medications  Medication Dose Route Frequency Provider Last Rate Last Admin   acetaminophen (TYLENOL) tablet 650 mg  650 mg Oral Q6H PRN Rankin, Shuvon B, NP   650 mg at 05/21/22 1201   alum & mag hydroxide-simeth (MAALOX/MYLANTA) 200-200-20 MG/5ML suspension 30 mL  30 mL Oral Q4H PRN Rankin, Shuvon B, NP   30 mL at 05/24/22 2007   amLODipine (NORVASC) tablet 5 mg  5 mg Oral Daily Massengill, Nathan, MD   5 mg at  05/25/22 0254   antiseptic oral rinse (BIOTENE) solution 15 mL  15 mL Mouth Rinse PRN Ntuen, Jesusita Oka, FNP       benzonatate (TESSALON) capsule 100 mg  100 mg Oral BID PRN Massengill, Harrold Donath, MD       cyanocobalamin (VITAMIN B12) tablet 1,000 mcg  1,000 mcg Oral Daily Mason Jim, Amy E, MD   1,000 mcg at 05/25/22 2706   hydrOXYzine (ATARAX) tablet 25 mg  25 mg Oral TID Rankin, Shuvon B, NP   25 mg at 05/25/22 1159   OLANZapine zydis (ZYPREXA) disintegrating tablet 5 mg  5 mg Oral Q8H PRN Massengill, Nathan, MD       And   LORazepam (ATIVAN) tablet 1 mg  1 mg Oral Q6H PRN Massengill, Nathan, MD       And   ziprasidone (GEODON) injection 20 mg  20 mg Intramuscular Q8H PRN Phineas Inches, MD  magnesium hydroxide (MILK OF MAGNESIA) suspension 30 mL  30 mL Oral Daily PRN Rankin, Shuvon B, NP       nicotine (NICODERM CQ - dosed in mg/24 hours) patch 21 mg  21 mg Transdermal Daily Leevy-Johnson, Brooke A, NP   21 mg at 05/25/22 0953   propranolol (INDERAL) tablet 10 mg  10 mg Oral BID Massengill, Harrold Donath, MD   10 mg at 05/25/22 0953   traZODone (DESYREL) tablet 50 mg  50 mg Oral QHS PRN Massengill, Harrold Donath, MD   50 mg at 05/24/22 2147   ziprasidone (GEODON) capsule 60 mg  60 mg Oral BID WC Massengill, Harrold Donath, MD   60 mg at 05/25/22 7322    Lab Results:  Results for orders placed or performed during the hospital encounter of 05/17/22 (from the past 48 hour(s))  Hemoglobin A1c     Status: None   Collection Time: 05/24/22  6:43 PM  Result Value Ref Range   Hgb A1c MFr Bld 5.3 4.8 - 5.6 %    Comment: (NOTE) Pre diabetes:          5.7%-6.4%  Diabetes:              >6.4%  Glycemic control for   <7.0% adults with diabetes    Mean Plasma Glucose 105.41 mg/dL    Comment: Performed at [facility], [address], [place], [place] [address]     Blood Alcohol level:  Lab Results  Component Value Date   ETH <10 05/16/2022    Metabolic Disorder Labs: Lab Results  Component Value  Date   HGBA1C 5.3 05/24/2022   MPG 105.41 05/24/2022   No results found for: "PROLACTIN" Lab Results  Component Value Date   CHOL 214 (H) 04/25/2022   TRIG 355 (H) 04/25/2022   HDL 32 (L) 04/25/2022   CHOLHDL 6.7 (H) 04/25/2022   LDLCALC 120 (H) 04/25/2022    Physical Findings: AIMS: Facial and Oral Movements Muscles of Facial Expression: None, normal Lips and Perioral Area: None, normal Jaw: None, normal Tongue: None, normal,Extremity Movements Upper (arms, wrists, hands, fingers): None, normal Lower (legs, knees, ankles, toes): None, normal, Trunk Movements Neck, shoulders, hips: None, normal, Overall Severity Severity of abnormal movements (highest score from questions above): None, normal Incapacitation due to abnormal movements: None, normal Patient's awareness of abnormal movements (rate only patient's report): No Awareness, Dental Status Current problems with teeth and/or dentures?: No Does patient usually wear dentures?: No  CIWA:    COWS:     Musculoskeletal: Strength & Muscle Tone: within normal limits Gait & Station: normal Patient leans: N/A  Psychiatric Specialty Exam:  Presentation  General Appearance: Casual  Eye Contact:Fair  Speech:Normal Rate  Speech Volume:Normal  Handedness:Right   Mood and Affect  Mood: Euthymic denies feeling depressed or anxious Affect:Congruent; Full Range   Thought Process  Thought Processes:Linear  Descriptions of Associations:Intact  Orientation:Full (Time, Place and Person)  Thought Content:Paranoid Ideation  History of Schizophrenia/Schizoaffective disorder:No data recorded Duration of Psychotic Symptoms:No data recorded Hallucinations:No data recorded denies AVH and does not present responding to internal stimuli  Ideas of Reference: Improved paranoia since admission denies any currently, does not present paranoid or suspicious Suicidal Thoughts:No data recorded Denies SI intention or  plan  Homicidal Thoughts:No data recorded Denies   Sensorium  Memory:Immediate Good; Recent Good; Remote Good  Judgment:Fair  Insight: Improved since admission, understands having mental illness and agrees to comply with medication after discharge  Executive Functions  Concentration:Fair  Attention Span:Fair  [place]   Psychomotor Activity  Psychomotor Activity: No psychomotor agitation or retardation noted   Assets  Assets:Communication Skills; Physical Health; Desire for Improvement   Sleep  Sleep:No data recorded okay   Physical Exam: Physical Exam Vitals reviewed.  Pulmonary:     Effort: Pulmonary effort is normal.  Neurological:     Mental Status: He is alert.     Motor: No weakness.     Gait: Gait normal.    Review of Systems  Constitutional:  Negative for chills and fever.  Cardiovascular:  Negative for chest pain and palpitations.  Neurological:  Negative for dizziness, tingling, tremors and headaches.  Psychiatric/Behavioral:  Positive for depression, substance abuse and suicidal ideas. The patient is nervous/anxious.   On 9/21 negative for depression or suicidal ideation, negative for anxiety, presents call. Blood pressure (!) 148/97, pulse (!) 119, temperature 97.8 F (36.6 C), temperature source Oral, resp. rate 16, height 6' (1.829 m), weight 92.1 kg, SpO2 96 %. Body mass index is 27.53 kg/m.   Treatment Plan Summary: Daily contact with patient to assess and evaluate symptoms and progress in treatment  Assessment: -Schizoaffective disorder bipolar type -GAD -Tobacco use disorder -Vitamin B12 deficiency    PLAN: Safety and Monitoring:  -- Voluntary admission to inpatient psychiatric unit for safety, stabilization and treatment  -- Daily contact with patient to assess and evaluate symptoms and progress in treatment  -- Patient's case to be discussed in multi-disciplinary team meeting  --  Observation Level : q15 minute checks  -- Vital signs:  q12 hours  -- Precautions: suicide, elopement, and assault  2. Psychiatric Diagnoses and Treatment:    -Continue Geodon 60 mg twice daily with meals for schizoaffective disorder bipolar type -Previously Stopped Zyprexa due to side effects and limited efficacy -Previously stopped metformin, due to patient preference (and also Geodon has less metabolic side effects from Zyprexa) -Continue Norvasc 5 mg once daily for hypertension -Increase propranolol to 20 mg BID  --  The risks/benefits/side-effects/alternatives to this medication were discussed in detail with the patient and time was given for questions. The patient consents to medication trial.     3. Medical Issues Being Addressed:   Tobacco Use Disorder  -- Nicotine patch 21mg /24 hours ordered  -- Smoking cessation encouraged  4. Discharge Planning:   -- Social work and case management to assist with discharge planning and identification of hospital follow-up needs prior to discharge  -- Anticipate discharge tomorrow.  -- Discharge Concerns: Need to establish a safety plan; Medication compliance and effectiveness  -- Discharge Goals: Return home with outpatient referrals for mental health follow-up including medication management/psychotherapy  Total Time Spent in Direct Patient Care:  I personally spent 35 minutes on the unit in direct patient care. The direct patient care time included face-to-face time with the patient, reviewing the patient's chart, communicating with other professionals, and coordinating care. Greater than 50% of this time was spent in counseling or coordinating care with the patient regarding goals of hospitalization, psycho-education, and discharge planning needs.  Roda Lauture Winfred Leeds, MD 05/25/2022, 2:16 PM

## 2022-05-25 NOTE — Progress Notes (Signed)
   05/25/22 0530  Sleep  Number of Hours 7.25

## 2022-05-25 NOTE — Progress Notes (Signed)
   05/25/22 2000  Psych Admission Type (Psych Patients Only)  Admission Status Voluntary  Psychosocial Assessment  Patient Complaints None  Eye Contact Fair  Facial Expression Anxious;Animated  Affect Anxious  Speech Logical/coherent  Interaction Assertive  Motor Activity Slow  Appearance/Hygiene Unremarkable  Behavior Characteristics Cooperative  Mood Pleasant  Aggressive Behavior  Effect No apparent injury  Thought Process  Coherency WDL  Content WDL  Delusions WDL  Perception WDL  Hallucination None reported or observed  Judgment WDL  Confusion None  Danger to Self  Current suicidal ideation? Denies  Danger to Others  Danger to Others None reported or observed

## 2022-05-25 NOTE — Progress Notes (Signed)
The focus of this group is to help patients review their daily goal of treatment and discuss progress on daily workbooks.  Pt attended the evening group and responded to all discussion prompts from the [place]. Pt shared that today was a good day on the unit, the highlight of which was going outside to the courtyard. "We just sat around and enjoyed the scenery. It was nice."  On the subject of staying well upon discharge, Marcus Martinez mentioned his plan to use coping skills, both new and old, as well as his intention to start seeing a therapist on a regular basis.  Pt rated his day a 5 out of 10 and his affect was appropriate.

## 2022-05-25 NOTE — Progress Notes (Signed)
Pt up requesting something to help him sleep, pt appears sleepy eyes red and encouraged to lay down to get some rest. Pt came back irritated stating he needed something to help him sleep. Pt given PRN Zyprexa per [facility]

## 2022-05-26 DIAGNOSIS — F250 Schizoaffective disorder, bipolar type: Secondary | ICD-10-CM | POA: Diagnosis not present

## 2022-05-26 MED ORDER — BENZTROPINE MESYLATE 0.5 MG PO TABS: 0.5000 mg | ORAL_TABLET | Freq: Two times a day (BID) | ORAL | 0 refills | Status: DC | PRN

## 2022-05-26 MED ORDER — AMLODIPINE BESYLATE 5 MG PO TABS: 5.0000 mg | ORAL_TABLET | Freq: Every day | ORAL | 0 refills | Status: DC

## 2022-05-26 MED ORDER — CYANOCOBALAMIN 1000 MCG PO TABS: 1000.0000 ug | ORAL_TABLET | Freq: Every day | ORAL | 0 refills | Status: DC

## 2022-05-26 MED ORDER — BIOTENE DRY MOUTH MT LIQD: 15.0000 mL | OROMUCOSAL | 0 refills | Status: DC | PRN

## 2022-05-26 MED ORDER — TRAZODONE HCL 50 MG PO TABS: 50.0000 mg | ORAL_TABLET | Freq: Every evening | ORAL | 0 refills | Status: DC | PRN

## 2022-05-26 MED ORDER — BENZTROPINE MESYLATE 0.5 MG PO TABS: 0.5000 mg | ORAL_TABLET | Freq: Two times a day (BID) | ORAL | Status: DC | PRN

## 2022-05-26 MED ORDER — NICOTINE 21 MG/24HR TD PT24: 21.0000 mg | MEDICATED_PATCH | Freq: Every day | TRANSDERMAL | 0 refills | Status: DC

## 2022-05-26 MED ORDER — ZIPRASIDONE HCL 60 MG PO CAPS: 60.0000 mg | ORAL_CAPSULE | Freq: Two times a day (BID) | ORAL | 0 refills | Status: DC

## 2022-05-26 MED ORDER — HYDROXYZINE HCL 25 MG PO TABS: 25.0000 mg | ORAL_TABLET | Freq: Three times a day (TID) | ORAL | 0 refills | Status: DC

## 2022-05-26 MED ORDER — PROPRANOLOL HCL 10 MG PO TABS: 10.0000 mg | ORAL_TABLET | Freq: Two times a day (BID) | ORAL | 0 refills | Status: DC

## 2022-05-26 MED ORDER — BENZONATATE 100 MG PO CAPS: 100.0000 mg | ORAL_CAPSULE | Freq: Two times a day (BID) | ORAL | 0 refills | Status: DC | PRN

## 2022-05-26 NOTE — Progress Notes (Signed)
Pt discharged from facility at 12pm. Pt removed all belongings and valuables. Per Pt request, discharge instructions, medications, and follow up care discussed with pt and his family together. Pt and family verbalized understanding. Pt denies current SI/HI/self harm thoughts. Pt denies a/v hallucinations.

## 2022-05-26 NOTE — Progress Notes (Signed)
  [facility] Adult Case Management Discharge Plan :  Will you be returning to the same living situation after discharge:  Yes,  Home  At discharge, do you have transportation home?: Yes,  Mother and step-father  Do you have the ability to pay for your medications: Yes,  [employer] of information consent forms completed and in the chart;  Patient's signature needed at discharge.  Patient to Follow up at:  [place], [facility]. Go on 05/30/2022.   Why: You have an assessment appointment for therapy services on  05/30/22 at 10:30 pm, in person.  You have an appointment for medication management management services on 06/06/22 at 1:00 pm (Virtual).    You have an appointment for primary care services on 05/31/22 at 3:00 pm. This appointments will be in person.  Please call to confirm all the details of these appointments. Contact information: [place] [address] [phone]                 Next level of care provider has access to [place] and Suicide Prevention discussed: Yes,  with patient, step-father, and half-sister     Has patient been referred to the Quitline?: Patient refused referral  Patient has been referred for addiction treatment: Pt. refused referral  Darleen Crocker, Latanya Presser 05/26/2022, 9:33 AM

## 2022-05-26 NOTE — Group Note (Signed)
Date:  05/26/2022 Time:  10:35 AM  Group Topic/Focus:  Orientation:   The focus of this group is to educate the patient on the purpose and policies of crisis stabilization and provide a format to answer questions about their admission.  The group details unit policies and expectations of patients while admitted.    Participation Level:  Active  Participation Quality:  Appropriate  Affect:  Appropriate  Cognitive:  Appropriate  Insight: Appropriate  Engagement in Group:  Poor  Modes of Intervention:  Discussion  Additional Comments:     Jerrye Beavers 05/26/2022, 10:35 AM

## 2022-05-26 NOTE — Progress Notes (Signed)
   05/26/22 0515  Sleep  Number of Hours 6.25

## 2022-05-26 NOTE — Discharge Summary (Addendum)
Physician Discharge Summary Note  Patient:  Marcus Martinez is an 31 y.o., male MRN:  [id number] DOB:  01/25/1991 Patient phone:  [phone] (home)  Patient address:   [address] [place] [address],  Total Time spent with patient: 30 minutes  Date of Admission:  05/17/2022 Date of Discharge: 05/26/2022  Reason for Admission:  Kawan Valladolid is a 31 yo male with past mental health diagnoses of MDD & anxiety who presented to the [facility] hospital ER on 05/16/22 with paranoia, thinking that the government is after him and his family. Pt reported during [facility] assessment: "the government is flying planes over my house, since I got my Iphone13 hooked up there are other hooking up to the servers and they are mutating their bodies". Pt was transferred voluntarily to [facility] for treatment and stabilization of his mental health symptoms.  Principal Problem: Schizoaffective disorder, bipolar type [facility]) Discharge Diagnoses: Principal Problem:   Schizoaffective disorder, bipolar type ([place]) Active Problems:   Anxiety   OCD (obsessive compulsive disorder)   HTN (hypertension)  Past Psychiatric History: As above  Past Medical History:  Past Medical History:  Diagnosis Date   ADHD (attention deficit hyperactivity disorder)    GAD (generalized anxiety disorder)    Major depressive disorder     Past Surgical History:  Procedure Laterality Date   WISDOM TOOTH EXTRACTION     Family History:  Family History  Problem Relation Age of Onset   Colon cancer Neg Hx    Esophageal cancer Neg Hx    Rectal cancer Neg Hx    Stomach cancer Neg Hx    Family Psychiatric  History: none reported Social History:  Social History   Substance and Sexual Activity  Alcohol Use Yes   Comment: occasional     Social History   Substance and Sexual Activity  Drug Use Not Currently   Types: Marijuana    Social History   Socioeconomic History   Marital status: Single    Spouse name: Not on file    Number of children: Not on file   Years of education: Not on file   Highest education level: Not on file  Occupational History   Occupation: Stage manager  Tobacco Use   Smoking status: Every Day    Packs/day: 0.25    Types: Cigarettes   Smokeless tobacco: Never  Vaping Use   Vaping Use: Never used  Substance and Sexual Activity   Alcohol use: Yes    Comment: occasional   Drug use: Not Currently    Types: Marijuana   Sexual activity: Never  Other Topics Concern   Not on file  Social History Narrative   Not on file   Social Determinants of Health   Financial Resource Strain: Low Risk  (11/14/2017)   Overall Financial Resource Strain (CARDIA)    Difficulty of Paying Living Expenses: Not hard at all  Food Insecurity: No Food Insecurity (11/14/2017)   Hunger Vital Sign    Worried About Running Out of Food in the Last Year: Never true    Ran Out of Food in the Last Year: Never true  Transportation Needs: No Transportation Needs (11/14/2017)   PRAPARE - Hydrologist (Medical): No    Lack of Transportation (Non-Medical): No  Physical Activity: Inactive (11/14/2017)   Exercise Vital Sign    Days of Exercise per Week: 0 days    Minutes of Exercise per Session: 0 min  Stress: No Stress  Concern Present (11/14/2017)   [employer] of Occupational Health - Occupational Stress Questionnaire    Feeling of Stress : Not at all  Social Connections: Somewhat Isolated (11/14/2017)   Social Connection and Isolation Panel [NHANES]    Frequency of Communication with Friends and Family: More than three times a week    Frequency of Social Gatherings with Friends and Family: Once a week    Attends Religious Services: More than 4 times per year    Active Member of [employer] or Organizations: No    Attends Engineer, structural: Never    Marital Status: Never married                                            HOSPITAL COURSE During the patient's  hospitalization, patient had extensive initial psychiatric evaluation, and follow-up psychiatric evaluations every day.  Psychiatric diagnoses provided upon initial assessment were as follows:  Principal Problem:   Schizoaffective disorder, bipolar type (H[facility]) Active Problems:   OCD (obsessive compulsive disorder)   Anxiety   Mood disorder of manic type (HCC)   Patient's psychiatric medications were adjusted on admission as follows: Schizoaffective disorder, bipolar type -Increase Olanzapine Zydis 10 mg daily at bedtime: (Educated on rationales, benefits and possible side effects of this medication including weight gan & metabolic syndrome and verbalizes understanding).   Depression -Discontinue Celexa 20 mg daily    Tobacco use disorder -Nicotine patch 21 mg transdermal daily -Nicorette gum 2 mg as needed   Anxiety -Hydroxyzine (Atarax) 25 mg TID   Hyperglycemia -Daily Metformin 500 mg ER daily for breakfast   During the hospitalization, other adjustments were made to the patient's psychiatric medication regimen, with medications at discharge being as follows:               -Continue Geodon 60 mg twice daily with meals for schizoaffective disorder bipolar type (e (EKG today NSR with [id number]) -Previously Stopped Zyprexa due to side effects and limited efficacy -Previously stopped metformin, due to patient preference (and also Geodon has less metabolic side effects than Zyprexa) -Continue Norvasc 5 mg once daily for hypertension -Continue propranolol 20 mg BID -Continue Cogentin 0.5 mg BID PRN for tremors, stiffness, restlessness while on Geodon -Continue Trazodone 50 mg nightly PRN for insomnia -Continue Hydroxyzine 25 mg TID PRN for anxiety -Continue Nicotine patch 21mg /24 hours for nicotine dependence -Smoking cessation encouraged Patient's care was discussed during the interdisciplinary team meeting every day during the hospitalization.   The patient denies having side  effects to prescribed psychiatric medication. The patient was evaluated each day by a clinical provider to ascertain response to treatment. Improvement was noted by the patient's report of decreasing symptoms, improved sleep and appetite, affect, medication tolerance, behavior, and participation in unit programming.  Patient was asked each day to complete a self inventory noting mood, mental status, pain, new symptoms, anxiety and concerns.     Symptoms were reported as significantly decreased or resolved completely by discharge. On day of discharge, the patient reports that their mood is stable. The patient denied having suicidal thoughts for more than 48 hours prior to discharge.  Patient denies having homicidal thoughts.  Patient denies having auditory hallucinations.  Patient denies any visual hallucinations or other symptoms of psychosis. The patient was motivated to continue taking medication with a goal of continued improvement in mental health.  The patient reports their target psychiatric symptoms of psychosis responded well to the psychiatric medications, and the patient reports overall benefit from this psychiatric hospitalization. Supportive psychotherapy was provided to the patient. The patient also participated in regular group therapy while hospitalized. Coping skills, problem solving as well as relaxation therapies were also part of the unit programming.   Labs were reviewed with the patient, and abnormal results were discussed with the patient. Triglycerides for this admission are 355, HDL-32, cholesterol 214, LDL-120. Total cholesterol/HDL ratio is 6.7. Pt has been educated to maintain a healthy diet and exercise, and to f/u with his PCP regarding his lipid panel. Vitamin B12 this admission is low at 176. Pt being supplemented, and has been educated to continue taking the supplements, and to f/u with his PCP after discharge and has verbalized understanding.   Physical Findings: AIMS:  Facial and Oral Movements Muscles of Facial Expression: None, normal Lips and Perioral Area: None, normal Jaw: None, normal Tongue: None, normal,Extremity Movements Upper (arms, wrists, hands, fingers): None, normal Lower (legs, knees, ankles, toes): None, normal, Trunk Movements Neck, shoulders, hips: None, normal, Overall Severity Severity of abnormal movements (highest score from questions above): None, normal Incapacitation due to abnormal movements: None, normal Patient's awareness of abnormal movements (rate only patient's report): No Awareness, Dental Status Current problems with teeth and/or dentures?: No Does patient usually wear dentures?: No  CIWA:  n/a  COWS:  n/a AIMS: 0   Musculoskeletal: Strength & Muscle Tone: within normal limits Gait & Station: normal Patient leans: N/A  Psychiatric Specialty Exam:  Presentation  General Appearance: Appropriate for Environment; Fairly Groomed  Eye Contact:Good  Speech:Clear and Coherent  Speech Volume:Normal  Handedness:Right   Mood and Affect  Mood:Depressed; Anxious; Worthless  Affect:Congruent   Social worker Processes:Coherent  Descriptions of Associations:Intact  Orientation:Full (Time, Place and Person)  Thought Content:Logical  History of Schizophrenia/Schizoaffective disorder:Yes  Duration of Psychotic Symptoms:N/A  Hallucinations:Hallucinations: None  Ideas of Reference:None  Suicidal Thoughts:Suicidal Thoughts: No  Homicidal Thoughts:Homicidal Thoughts: No   Sensorium  Memory:Immediate Good  Judgment:Good  Insight:Good   Executive Functions  Concentration:Good  Attention Span:Good  [place] of Knowledge:Good  Language:Good   Psychomotor Activity  Psychomotor Activity:Psychomotor Activity: Normal   Assets  Assets:Communication Skills; Housing; Social Support   Sleep  Sleep:Sleep: Good    Physical Exam: Physical Exam Constitutional:       Appearance: Normal appearance.  HENT:     Head: Normocephalic.     Nose: Nose normal. No congestion or rhinorrhea.  Eyes:     Pupils: Pupils are equal, round, and reactive to light.  Pulmonary:     Effort: Pulmonary effort is normal.  Musculoskeletal:        General: Normal range of motion.     Cervical back: Normal range of motion.  Neurological:     Mental Status: He is alert and oriented to person, place, and time.    Review of Systems  Constitutional: Negative.   HENT: Negative.    Eyes: Negative.   Respiratory: Negative.    Cardiovascular: Negative.   Gastrointestinal: Negative.   Genitourinary: Negative.   Musculoskeletal: Negative.   Skin: Negative.   Neurological: Negative.   Psychiatric/Behavioral:  Negative for depression, hallucinations, memory loss, substance abuse and suicidal ideas. The patient is not nervous/anxious and does not have insomnia.    Blood pressure 132/83, pulse 98, temperature 98.5 F (36.9 C), temperature source Oral, resp. rate 16, height 6' (1.829 m), weight  92.1 kg, SpO2 96 %. Body mass index is 27.53 kg/m.   Social History   Tobacco Use  Smoking Status Every Day   Packs/day: 0.25   Types: Cigarettes  Smokeless Tobacco Never   Tobacco Cessation:  A prescription for an FDA-approved tobacco cessation medication provided at discharge   Blood Alcohol level:  Lab Results  Component Value Date   ETH <10 99991111    Metabolic Disorder Labs:  Lab Results  Component Value Date   HGBA1C 5.3 05/24/2022   MPG 105.41 05/24/2022   No results found for: "PROLACTIN" Lab Results  Component Value Date   CHOL 214 (H) 04/25/2022   TRIG 355 (H) 04/25/2022   HDL 32 (L) 04/25/2022   CHOLHDL 6.7 (H) 04/25/2022   LDLCALC 120 (H) 04/25/2022    See Psychiatric Specialty Exam and Suicide Risk Assessment completed by Attending Physician prior to discharge.  Discharge destination:  Home  Is patient on multiple antipsychotic therapies at  discharge:  No   Has Patient had three or more failed trials of antipsychotic monotherapy by history:  No  Recommended Plan for Multiple Antipsychotic Therapies: NA   Allergies as of 05/26/2022       Reactions   Citalopram Diarrhea, Other (See Comments)   "Suicidal Thoughts"   Penicillins Rash        Medication List     STOP taking these medications    citalopram 20 MG tablet Commonly known as: CELEXA   fluticasone 50 MCG/ACT nasal spray Commonly known as: FLONASE   hyoscyamine 0.125 MG SL tablet Commonly known as: LEVSIN SL   hyoscyamine 0.375 MG 12 hr tablet Commonly known as: Levbid   levocetirizine 5 MG tablet Commonly known as: XYZAL   loperamide 2 MG tablet Commonly known as: Imodium A-D   omega-3 acid ethyl esters 1 g capsule Commonly known as: Lovaza       TAKE these medications      Indication  amLODipine 5 MG tablet Commonly known as: NORVASC Take 1 tablet (5 mg total) by mouth daily. Start taking on: May 27, 2022  Indication: High Blood Pressure Disorder   antiseptic oral rinse Liqd 15 mLs by Mouth Rinse route as needed for dry mouth.  Indication: dry mouth   benzonatate 100 MG capsule Commonly known as: TESSALON Take 1 capsule (100 mg total) by mouth 2 (two) times daily as needed for cough.  Indication: cough   benztropine 0.5 MG tablet Commonly known as: COGENTIN Take 1 tablet (0.5 mg total) by mouth 2 (two) times daily as needed for tremors (stiffness, restlessness while on Geodon).  Indication: Extrapyramidal Reaction caused by Medications   cyanocobalamin 1000 MCG tablet Take 1 tablet (1,000 mcg total) by mouth daily. Start taking on: May 27, 2022  Indication: Inadequate Vitamin B12   hydrOXYzine 25 MG tablet Commonly known as: ATARAX Take 1 tablet (25 mg total) by mouth 3 (three) times daily.  Indication: Feeling Anxious   nicotine 21 mg/24hr patch Commonly known as: NICODERM CQ - dosed in mg/24 hours Place 1  patch (21 mg total) onto the skin daily. Start taking on: May 27, 2022  Indication: Nicotine Addiction   propranolol 10 MG tablet Commonly known as: INDERAL Take 1 tablet (10 mg total) by mouth 2 (two) times daily.  Indication: Feeling Anxious   traZODone 50 MG tablet Commonly known as: DESYREL Take 1 tablet (50 mg total) by mouth at bedtime as needed for sleep.  Indication: Trouble Sleeping   ziprasidone 60  MG capsule Commonly known as: GEODON Take 1 capsule (60 mg total) by mouth 2 (two) times daily with a meal.  Indication: schizoaffective d/o        [place], [facility]. Go on 05/30/2022.   Why: You have an assessment appointment for therapy services on  05/30/22 at 10:30 pm, in person.  You have an appointment for medication management management services on 06/06/22 at 1:00 pm (Virtual).    You have an appointment for primary care services on 05/31/22 at 3:00 pm. This appointments will be in person.  Please call to confirm all the details of these appointments. Contact information: [address] [place]  [address] [phone]                Follow-up recommendations:   The patient is able to verbalize their individual safety plan to this provider.   # It is recommended to the patient to continue psychiatric medications as prescribed, after discharge from the hospital.     # It is recommended to the patient to follow up with your outpatient psychiatric provider and PCP.   # It was discussed with the patient, the impact of alcohol, drugs, tobacco have been there overall psychiatric and medical wellbeing, and total abstinence from substance use was recommended the patient.ed.   # Prescriptions provided or sent directly to preferred pharmacy at discharge. Patient agreeable to plan. Given opportunity to ask questions. Appears to feel comfortable with discharge.    # In the event of worsening symptoms, the patient is  instructed to call the crisis hotline (988), 911 and or go to the nearest ED for appropriate evaluation and treatment of symptoms. To follow-up with primary care provider for other medical issues, concerns and or health care needs   # Patient was discharged home to his mother's home with a plan to follow up as noted above.   Signed: Nicholes Rough, NP 05/26/2022, 11:03 AM

## 2022-05-26 NOTE — [facility] Suicide Risk Assessment (Addendum)
Suicide Risk Assessment  Discharge Assessment    [facility] Discharge Suicide Risk Assessment   Principal Problem: Schizoaffective disorder, bipolar type [facility]) Discharge Diagnoses: Principal Problem:   Schizoaffective disorder, bipolar type ([place]) Active Problems:   Anxiety   OCD (obsessive compulsive disorder)   HTN (hypertension)  Reason for admission: Marcus Martinez is a 31 yo male with past mental health diagnoses of MDD & anxiety who presented to the [facility] ER on 05/16/22 with paranoia, thinking that the government is after him and his family. Pt reported during [facility] assessment: "the government is flying planes over my house, since I got my Iphone13 hooked up there are other hooking up to the servers and they are mutating their bodies". Pt was transferred voluntarily to [facility] for treatment and stabilization of his mental health symptoms.                                        HOSPITAL COURSE During the patient's hospitalization, patient had extensive initial psychiatric evaluation, and follow-up psychiatric evaluations every day.  Psychiatric diagnoses provided upon initial assessment were as follows:  Principal Problem:   Schizoaffective disorder, bipolar type ([place]) Active Problems:   OCD (obsessive compulsive disorder)   Anxiety   Mood disorder of manic type ([place])  Patient's psychiatric medications were adjusted on admission as follows: Schizoaffective disorder, bipolar type -Increase Olanzapine Zydis 10 mg daily at bedtime: (Educated on rationales, benefits and possible side effects of this medication including weight gan & metabolic syndrome and verbalizes understanding).   Depression -Discontinue Celexa 20 mg daily    Tobacco use disorder -Nicotine patch 21 mg transdermal daily -Nicorette gum 2 mg as needed   Anxiety -Hydroxyzine (Atarax) 25 mg TID   Hyperglycemia -Daily Metformin 500 mg ER daily for breakfast  During the hospitalization, other adjustments  were made to the patient's psychiatric medication regimen, with medications at discharge being as follows:               -Continue Geodon 60 mg twice daily with meals for schizoaffective disorder bipolar type (EKG today NSR with [id number]) -Previously Stopped Zyprexa due to side effects and limited efficacy -Previously stopped metformin, due to patient preference (and also Geodon has less metabolic side effects than Zyprexa) -Continue Norvasc 5 mg once daily for hypertension -Continue propranolol 20 mg BID -Continue Cogentin 0.5 mg BID PRN for tremors, stiffness, restlessness while on Geodon -Continue Trazodone 50 mg nightly PRN for insomnia -Continue Hydroxyzine 25 mg TID PRN for anxiety -Continue Nicotine patch 21mg /24 hours for nicotine dependence -Smoking cessation encouraged Patient's care was discussed during the interdisciplinary team meeting every day during the hospitalization.  The patient denies having side effects to prescribed psychiatric medication. The patient was evaluated each day by a clinical provider to ascertain response to treatment. Improvement was noted by the patient's report of decreasing symptoms, improved sleep and appetite, affect, medication tolerance, behavior, and participation in unit programming.  Patient was asked each day to complete a self inventory noting mood, mental status, pain, new symptoms, anxiety and concerns.    Symptoms were reported as significantly decreased or resolved completely by discharge. On day of discharge, the patient reports that their mood is stable. The patient denied having suicidal thoughts for more than 48 hours prior to discharge.  Patient denies having homicidal thoughts.  Patient denies having auditory hallucinations.  Patient denies any visual hallucinations  or other symptoms of psychosis. The patient was motivated to continue taking medication with a goal of continued improvement in mental health.   The patient reports their target  psychiatric symptoms of psychosis responded well to the psychiatric medications, and the patient reports overall benefit from this psychiatric hospitalization. Supportive psychotherapy was provided to the patient. The patient also participated in regular group therapy while hospitalized. Coping skills, problem solving as well as relaxation therapies were also part of the unit programming.  Labs were reviewed with the patient, and abnormal results were discussed with the patient. Triglycerides for this admission are 355, HDL-32, cholesterol 214, LDL-120. Total cholesterol/HDL ratio is 6.7. Pt has been educated to maintain a healthy diet and exercise, and to f/u with his PCP regarding his lipid panel. Vitamin B12 this admission is low at 176. Pt being supplemented, and has been educated to continue taking the supplements, and to f/u with his PCP after discharge and has verbalized understanding.  Total Time spent with patient: 30 minutes  Musculoskeletal: Strength & Muscle Tone: within normal limits Gait & Station: normal Patient leans: N/A  Psychiatric Specialty Exam  Presentation  General Appearance: Appropriate for Environment; Fairly Groomed  Eye Contact:Good  Speech:Clear and Coherent  Speech Volume:Normal  Handedness:Right  Mood and Affect  Mood:Depressed; Anxious; Worthless  Duration of Depression Symptoms: Less than two weeks  Affect:Congruent  Thought Process  Thought Processes:Coherent  Descriptions of Associations:Intact  Orientation:Full (Time, Place and Person)  Thought Content:Logical  History of Schizophrenia/Schizoaffective disorder:Yes  Duration of Psychotic Symptoms:N/A  Hallucinations:Hallucinations: None  Ideas of Reference:None  Suicidal Thoughts:Suicidal Thoughts: No  Homicidal Thoughts:Homicidal Thoughts: No   Sensorium  Memory:Immediate Good  Judgment:Good  Insight:Good  Executive Functions  Concentration:Good  Attention  Span:Good  Recall:Good  Fund of Knowledge:Good  Language:Good  Psychomotor Activity  Psychomotor Activity:Psychomotor Activity: Normal   Assets  Assets:Communication Skills; Housing; Social Support  Sleep  Sleep:Sleep: Good   Physical Exam: Physical Exam Constitutional:      Appearance: Normal appearance.  HENT:     Head: Normocephalic.     Nose: Nose normal. No congestion or rhinorrhea.  Eyes:     Pupils: Pupils are equal, round, and reactive to light.  Pulmonary:     Effort: Pulmonary effort is normal.  Musculoskeletal:        General: Normal range of motion.     Cervical back: Normal range of motion.  Neurological:     Mental Status: He is alert and oriented to person, place, and time.  Psychiatric:        Thought Content: Thought content normal.    Review of Systems  Constitutional: Negative.   HENT: Negative.    Eyes: Negative.   Respiratory: Negative.    Cardiovascular: Negative.   Gastrointestinal: Negative.   Skin: Negative.   Neurological: Negative.   Psychiatric/Behavioral:  Negative for depression, hallucinations, memory loss, substance abuse and suicidal ideas. The patient is not nervous/anxious and does not have insomnia.    Blood pressure 132/83, pulse 98, temperature 98.5 F (36.9 C), temperature source Oral, resp. rate 16, height 6' (1.829 m), weight 92.1 kg, SpO2 96 %. Body mass index is 27.53 kg/m.  Mental Status Per Nursing Assessment::   On Admission:     Demographic Factors:  Male and Caucasian  Loss Factors: NA  Historical Factors: NA  Risk Reduction Factors:   Positive social support  Continued Clinical Symptoms:  Pt's psychosis has resolved. He denies SI/HI/AVH, denies paranoia, and there is no  evidence of delusional thinking. He verbally contracts for safety outside of this [facility].  Cognitive Features That Contribute To Risk:  None    Suicide Risk:  Mild: Given recent psychosis and schizoaffective disorder  diagnosis - no acute suicidal ideation, intent or plan or active psychosis at time of discharge.   Follow-up Information     [facility]. Go on 05/30/2022.   Why: You have an assessment appointment for therapy services on  05/30/22 at 10:30 pm, in person.  You have an appointment for medication management management services on 06/06/22 at 1:00 pm (Virtual).    You have an appointment for primary care services on 05/31/22 at 3:00 pm. This appointments will be in person.  Please call to confirm all the details of these appointments. Contact information: [address] [place] [address] [phone]                Plan Of Care/Follow-up recommendations:  The patient is able to verbalize their individual safety plan to this provider.  # It is recommended to the patient to continue psychiatric medications as prescribed, after discharge from the hospital.    # It is recommended to the patient to follow up with your outpatient psychiatric provider and PCP.  # It was discussed with the patient, the impact of alcohol, drugs, tobacco have been there overall psychiatric and medical wellbeing, and total abstinence from substance use was recommended the patient.ed.  # Prescriptions provided or sent directly to preferred pharmacy at discharge. Patient agreeable to plan. Given opportunity to ask questions. Appears to feel comfortable with discharge.    # In the event of worsening symptoms, the patient is instructed to call the crisis hotline (988), 911 and or go to the nearest ED for appropriate evaluation and treatment of symptoms. To follow-up with primary care provider for other medical issues, concerns and or health care needs  # Patient was discharged home to his mother's home with a plan to follow up as noted above.   Starleen Blue, NP 05/26/2022, 10:55 AM

## 2022-05-26 NOTE — Group Note (Signed)
Recreation Therapy Group Note   Group Topic:Team Building  Group Date: 05/26/2022 Start Time: 0930 End Time: 0950 Facilitators: Victorino Sparrow, LRT,CTRS Location: [address] Day[address]   Goal Area(s) Addresses:  Patient will effectively work with peer towards shared goal.  Patient will identify skills used to make activity successful.  Patient will identify how skills used during activity can be applied to reach post d/c goals.   Group Description: [employer]. In teams of 5-6, patients were given 12 craft pipe cleaners. Using the materials provided, patients were instructed to compete again the opposing team(s) to build the tallest free-standing structure from floor level. The activity was timed; difficulty increased by Probation officer as Pharmacist, hospital continued.  Systematically resources were removed with additional directions for example, placing one arm behind their back, working in silence, and shape stipulations. LRT facilitated post-activity discussion reviewing team processes and necessary communication skills involved in completion. Patients were encouraged to reflect how the skills utilized, or not utilized, in this activity can be incorporated to positively impact support systems post discharge.   Affect/Mood: Appropriate   Participation Level: Engaged   Participation Quality: Independent   Behavior: Appropriate   Speech/Thought Process: Focused   Insight: Good   Judgement: Good   Modes of Intervention: Team-building   Patient Response to Interventions:  Engaged   Education Outcome:  Acknowledges education and In group clarification offered    Clinical Observations/Individualized Feedback: Pt was bright and engaged throughout group session.  Pt was appropriate and was able to come up with a concept with his peers to complete the activity.      Plan: Continue to engage patient in RT group sessions 2-3x/week.   Victorino Sparrow, Glennis Brink 05/26/2022 12:59  PM

## 2022-06-14 ENCOUNTER — Telehealth: Payer: Self-pay

## 2022-06-14 NOTE — Telephone Encounter (Signed)
Transition Care Management Follow-up Telephone Call Date of discharge and from where: 06/13/2022 [facility] How have you been since you were released from the hospital? Patient states that he is doing well  Any questions or concerns? No  Items Reviewed: Did the pt receive and understand the discharge instructions provided? Yes  Medications obtained and verified? Yes  Other? Yes  Any new allergies since your discharge? No  Dietary orders reviewed? No changes Do you have support at home? Yes   Home Care and Equipment/Supplies: Were home health services ordered? no If so, what is the name of the agency? NA  Has the agency set up a time to come to the patient's home? not applicable Were any new equipment or medical supplies ordered?  No What is the name of the medical supply agency? NA Were you able to get the supplies/equipment? not applicable Do you have any questions related to the use of the equipment or supplies? No  Functional Questionnaire: (I = Independent and D = Dependent) ADLs: I  Bathing/Dressing- I  Meal Prep- I  Eating- I  Maintaining continence- I  Transferring/Ambulation- I  Managing Meds- I  Follow up appointments reviewed:  PCP Hospital f/u appt confirmed?  Patient declines at this moment - states that he is working with [facility] to get appt set up for specialist  Will call our office as needed [facility] f/u appt confirmed?  Patient is working to get appt set up - had to fill out paperwork and send in    Are transportation arrangements needed? No  If their condition worsens, is the pt aware to call PCP or go to the Emergency Dept.? Yes Was the patient provided with contact information for the PCP's office or ED? Yes Was to pt encouraged to call back with questions or concerns? Yes

## 2022-06-16 ENCOUNTER — Encounter: Payer: Self-pay | Admitting: Nurse Practitioner

## 2022-06-16 ENCOUNTER — Ambulatory Visit: Payer: 59 | Admitting: Nurse Practitioner

## 2022-06-16 DIAGNOSIS — F419 Anxiety disorder, unspecified: Secondary | ICD-10-CM

## 2022-06-16 DIAGNOSIS — F339 Major depressive disorder, recurrent, unspecified: Secondary | ICD-10-CM

## 2022-06-16 NOTE — Progress Notes (Signed)
Established Patient Office Visit  Subjective   Patient ID: Marcus Martinez, male    DOB: October 20, 1990  Age: 31 y.o. MRN: [id number]  Chief Complaint  Patient presents with   Hospitalization Follow-up    Mental health f/u    HPI Depression, Follow-up  He  was last seen for this few weeks ago. Changes made at last visit include   He reports good compliance with treatment. He is not having side effects. Geodan 60 mg tablet  He reports good tolerance of treatment. Current symptoms include: depressed mood and difficulty concentrating He feels he is Improved since last visit.     04/25/2022    3:43 PM 10/25/2020    9:27 AM 06/29/2018    8:58 AM  Depression screen PHQ 2/9  Decreased Interest 3 0 0  Down, Depressed, Hopeless 3 0 0  PHQ - 2 Score 6 0 0  Altered sleeping 3    Tired, decreased energy 0    Change in appetite 3    Feeling bad or failure about yourself  3    Trouble concentrating 0    Moving slowly or fidgety/restless 2    Suicidal thoughts 0    PHQ-9 Score 17    Difficult doing work/chores Extremely dIfficult      Anxiety, Follow-up  He was last seen for anxiety few weeks ago. Changes made at last visit include palipendine 156 mg/ml every 28 days.    He reports good compliance with treatment. He reports good tolerance of treatment. He is not having side effects.   He feels his anxiety is moderate and Improved since last visit.  Symptoms: No chest pain No difficulty concentrating  No dizziness No fatigue  No feelings of losing control Yes insomnia  Yes irritable No palpitations  No panic attacks Yes racing thoughts  No shortness of breath No sweating  No tremors/shakes    GAD-7 Results    04/25/2022    3:44 PM 01/31/2018    1:55 PM 06/20/2016    2:11 PM  GAD-7 Generalized Anxiety Disorder Screening Tool  1. Feeling Nervous, Anxious, or on Edge 3 2 0  2. Not Being Able to Stop or Control Worrying 3 3 0  3. Worrying Too Much About Different Things  3 3 1   4. Trouble Relaxing 2 2 2   5. Being So Restless it's Hard To Sit Still 3 1 2   6. Becoming Easily Annoyed or Irritable 3 3 0  7. Feeling Afraid As If Something Awful Might Happen 3 0 0  Total GAD-7 Score 20 14 5   Difficulty At Work, Home, or Getting  Along With Others?  Very difficult     PHQ-9 Scores    04/25/2022    3:43 PM 10/25/2020    9:27 AM 06/29/2018    8:58 AM  PHQ9 SCORE ONLY  PHQ-9 Total Score 17 0 0     Patient Active Problem List   Diagnosis Date Noted   HTN (hypertension) 05/24/2022   Schizoaffective disorder, bipolar type (HCC) 05/18/2022   OCD (obsessive compulsive disorder) 05/18/2022   Unspecified mood (affective) disorder (HCC) 05/17/2022   Depression, recurrent (HCC) 04/25/2022   Encounter for annual physical exam 04/25/2022   Anxiety 04/25/2022   Irritable bowel syndrome with diarrhea 12/28/2020   Diarrhea 10/25/2020   Tonsillar hypertrophy 11/15/2015   Past Medical History:  Diagnosis Date   ADHD (attention deficit hyperactivity disorder)    GAD (generalized anxiety disorder)    Major depressive disorder  Past Surgical History:  Procedure Laterality Date   WISDOM TOOTH EXTRACTION     Social History   Tobacco Use   Smoking status: Every Day    Packs/day: 0.25    Types: Cigarettes   Smokeless tobacco: Never  Vaping Use   Vaping Use: Never used  Substance Use Topics   Alcohol use: Yes    Comment: occasional   Drug use: Not Currently    Types: Marijuana   Social History   Socioeconomic History   Marital status: Single    Spouse name: Not on file   Number of children: Not on file   Years of education: Not on file   Highest education level: Not on file  Occupational History   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  Tobacco Use   Smoking status: Every Day    Packs/day: 0.25    Types: Cigarettes   Smokeless tobacco: Never  Vaping Use   Vaping Use: Never used  Substance and Sexual Activity   Alcohol use: Yes    Comment:  occasional   Drug use: Not Currently    Types: Marijuana   Sexual activity: Never  Other Topics Concern   Not on file  Social History Narrative   Not on file   Social Determinants of Health   Financial Resource Strain: Low Risk  (11/14/2017)   Overall Financial Resource Strain (CARDIA)    Difficulty of Paying Living Expenses: Not hard at all  Food Insecurity: No Food Insecurity (11/14/2017)   Hunger Vital Sign    Worried About Running Out of Food in the Last Year: Never true    Ran Out of Food in the Last Year: Never true  Transportation Needs: No Transportation Needs (11/14/2017)   PRAPARE - Administrator, Civil Service (Medical): No    Lack of Transportation (Non-Medical): No  Physical Activity: Inactive (11/14/2017)   Exercise Vital Sign    Days of Exercise per Week: 0 days    Minutes of Exercise per Session: 0 min  Stress: No Stress Concern Present (11/14/2017)   [employer] of Occupational Health - Occupational Stress Questionnaire    Feeling of Stress : Not at all  Social Connections: Somewhat Isolated (11/14/2017)   Social Connection and Isolation Panel [NHANES]    Frequency of Communication with Friends and Family: More than three times a week    Frequency of Social Gatherings with Friends and Family: Once a week    Attends Religious Services: More than 4 times per year    Active Member of [employer] or Organizations: No    Attends Banker Meetings: Never    Marital Status: Never married  Intimate Partner Violence: Not At Risk (11/14/2017)   Humiliation, Afraid, Rape, and Kick questionnaire    Fear of Current or Ex-Partner: No    Emotionally Abused: No    Physically Abused: No    Sexually Abused: No   Family Status  Relation Name Status   Mother  Alive   Father  Alive   Neg Hx  (Not Specified)   Family History  Problem Relation Age of Onset   Colon cancer Neg Hx    Esophageal cancer Neg Hx    Rectal cancer Neg Hx    Stomach cancer Neg  Hx    Allergies  Allergen Reactions   Citalopram Diarrhea and Other (See Comments)    "Suicidal Thoughts"   Penicillins Rash      Review of Systems  Constitutional: Negative.   HENT:  Negative.    Eyes: Negative.   Respiratory: Negative.    Cardiovascular: Negative.   Genitourinary: Negative.   Skin: Negative.  Negative for itching and rash.  Psychiatric/Behavioral:  Positive for depression. The patient is nervous/anxious.   All other systems reviewed and are negative.     Objective:     BP 125/89   Pulse (!) 102   Temp 98.7 F (37.1 C)   Ht 6' (1.829 m)   Wt 222 lb 1.6 oz (100.7 kg)   SpO2 96%   BMI 30.12 kg/m  BP Readings from Last 3 Encounters:  06/16/22 125/89  05/26/22 132/83  05/17/22 (!) 155/94   Wt Readings from Last 3 Encounters:  06/16/22 222 lb 1.6 oz (100.7 kg)  05/17/22 203 lb (92.1 kg)  04/25/22 211 lb (95.7 kg)      Physical Exam Vitals and nursing note reviewed.  Constitutional:      Appearance: Normal appearance.  HENT:     Head: Normocephalic.     Right Ear: External ear normal.     Left Ear: External ear normal.     Nose: Nose normal.     Mouth/Throat:     Mouth: Mucous membranes are moist.     Pharynx: Oropharynx is clear.  Eyes:     Conjunctiva/sclera: Conjunctivae normal.  Cardiovascular:     Rate and Rhythm: Normal rate and regular rhythm.     Pulses: Normal pulses.     Heart sounds: Normal heart sounds.  Pulmonary:     Effort: Pulmonary effort is normal.     Breath sounds: Normal breath sounds.  Abdominal:     General: Bowel sounds are normal.  Musculoskeletal:        General: Normal range of motion.  Skin:    General: Skin is warm.  Neurological:     General: No focal deficit present.     Mental Status: He is alert and oriented to person, place, and time.  Psychiatric:        Attention and Perception: Attention and perception normal.        Mood and Affect: Mood is anxious and depressed.        Speech: Speech  normal.        Behavior: Behavior normal. Behavior is cooperative.        Thought Content: Thought content normal.        Cognition and Memory: Cognition normal.      No results found for any visits on 06/16/22.  Last CBC Lab Results  Component Value Date   WBC 7.5 05/16/2022   HGB 16.5 05/16/2022   HCT 47.9 05/16/2022   MCV 88.2 05/16/2022   MCH 30.4 05/16/2022   RDW 13.1 05/16/2022   PLT 225 54/00/8676   Last metabolic panel Lab Results  Component Value Date   GLUCOSE 118 (H) 05/16/2022   NA 141 05/16/2022   K 3.8 05/16/2022   CL 106 05/16/2022   CO2 25 05/16/2022   BUN 8 05/16/2022   CREATININE 0.81 05/16/2022   GFRNONAA >60 05/16/2022   CALCIUM 9.3 05/16/2022   PROT 7.2 05/16/2022   ALBUMIN 3.9 05/16/2022   LABGLOB 2.7 04/25/2022   AGRATIO 1.7 04/25/2022   BILITOT 0.5 05/16/2022   ALKPHOS 77 05/16/2022   AST 26 05/16/2022   ALT 23 05/16/2022   ANIONGAP 10 05/16/2022   Last lipids Lab Results  Component Value Date   CHOL 214 (H) 04/25/2022   HDL 32 (L) 04/25/2022   LDLCALC  120 (H) 04/25/2022   TRIG 355 (H) 04/25/2022   CHOLHDL 6.7 (H) 04/25/2022      The ASCVD Risk score (Arnett DK, et al., 2019) failed to calculate for the following reasons:   The 2019 ASCVD risk score is only valid for ages 59 to 40    Assessment & Plan:   Problem List Items Addressed This Visit       Other   Depression, recurrent (HCC)    Patient was started on a new medication: Geodon 60 mg tablet by mouth twice daily and Palipendion 156 mg/ml sq every 28 days. Medication given at [facility]      Relevant Medications   hydrOXYzine (ATARAX) 25 MG tablet   Anxiety    Patient following up from hospital after worsening depression and anxiety.  Patient started on palipendion 156 mg/ml 28 days. Medication will be given at [facility]      Relevant Medications   hydrOXYzine (ATARAX) 25 MG tablet    No follow-ups on file.    Daryll Drown, NP

## 2022-06-16 NOTE — Assessment & Plan Note (Addendum)
Patient following up from hospital after worsening depression and anxiety.  Patient started on palipendion 156 mg/ml 28 days. Medication will be given at [facility]

## 2022-06-16 NOTE — Assessment & Plan Note (Addendum)
Patient was started on a new medication: Geodon 60 mg tablet by mouth twice daily and Palipendion 156 mg/ml sq every 28 days. Medication given at [facility]

## 2022-07-04 ENCOUNTER — Other Ambulatory Visit: Payer: Self-pay | Admitting: Nurse Practitioner

## 2022-07-05 ENCOUNTER — Other Ambulatory Visit: Payer: Self-pay | Admitting: Nurse Practitioner

## 2022-07-05 MED ORDER — AMLODIPINE BESYLATE 5 MG PO TABS: 5.0000 mg | ORAL_TABLET | Freq: Every day | ORAL | 0 refills | Status: DC

## 2022-07-05 NOTE — Telephone Encounter (Signed)
  Prescription Request  07/05/2022  Is this a "Controlled Substance" medicine? amLODipine (NORVASC) 5 MG tablet  Have you seen your PCP in the last 2 weeks? sept  If YES, route message to pool  -  If NO, patient needs to be scheduled for appointment.  What is the name of the medication or equipment? amLODipine (NORVASC) 5 MG tablet  Have you contacted your pharmacy to request a refill? no   Which pharmacy would you like this sent to? [facility]   Patient notified that their request is being sent to the clinical staff for review and that they should receive a response within 2 business days.

## 2022-07-05 NOTE — Telephone Encounter (Signed)
Pt aware refill sent to pharmacy 

## 2022-07-07 ENCOUNTER — Telehealth: Payer: Self-pay | Admitting: Nurse Practitioner

## 2022-07-07 NOTE — Telephone Encounter (Signed)
Pt aware form at front desk to pick up, also left a copy of a FMLA form, we had talked about more of intermittent leave than the form that he was given to be done.

## 2022-07-26 ENCOUNTER — Other Ambulatory Visit: Payer: Self-pay | Admitting: Family Medicine

## 2022-08-17 ENCOUNTER — Other Ambulatory Visit: Payer: Self-pay | Admitting: Nurse Practitioner

## 2022-08-30 ENCOUNTER — Ambulatory Visit (INDEPENDENT_AMBULATORY_CARE_PROVIDER_SITE_OTHER): Payer: 59 | Admitting: Nurse Practitioner

## 2022-08-30 ENCOUNTER — Encounter: Payer: Self-pay | Admitting: Nurse Practitioner

## 2022-08-30 VITALS — BP 126/87 | HR 110 | Temp 98.1°F | Resp 20 | Ht 72.0 in | Wt 222.0 lb

## 2022-08-30 DIAGNOSIS — R0683 Snoring: Secondary | ICD-10-CM | POA: Diagnosis not present

## 2022-08-30 DIAGNOSIS — J351 Hypertrophy of tonsils: Secondary | ICD-10-CM | POA: Diagnosis not present

## 2022-08-30 NOTE — Patient Instructions (Signed)
Sleep Apnea Sleep apnea is a condition in which breathing pauses or becomes shallow during sleep. People with sleep apnea usually snore loudly. They may have times when they gasp and stop breathing for 10 seconds or more during sleep. This may happen many times during the night. Sleep apnea disrupts your sleep and keeps your body from getting the rest that it needs. This condition can increase your risk of certain health problems, including: Heart attack. Stroke. Obesity. Type 2 diabetes. Heart failure. Irregular heartbeat. High blood pressure. The goal of treatment is to help you breathe normally again. What are the causes?  The most common cause of sleep apnea is a collapsed or blocked airway. There are three kinds of sleep apnea: Obstructive sleep apnea. This kind is caused by a blocked or collapsed airway. Central sleep apnea. This kind happens when the part of the brain that controls breathing does not send the correct signals to the muscles that control breathing. Mixed sleep apnea. This is a combination of obstructive and central sleep apnea. What increases the risk? You are more likely to develop this condition if you: Are overweight. Smoke. Have a smaller than normal airway. Are older. Are male. Drink alcohol. Take sedatives or tranquilizers. Have a family history of sleep apnea. Have a tongue or tonsils that are larger than normal. What are the signs or symptoms? Symptoms of this condition include: Trouble staying asleep. Loud snoring. Morning headaches. Waking up gasping. Dry mouth or sore throat in the morning. Daytime sleepiness and tiredness. If you have daytime fatigue because of sleep apnea, you may be more likely to have: Trouble concentrating. Forgetfulness. Irritability or mood swings. Personality changes. Feelings of depression. Sexual dysfunction. This may include loss of interest if you are male, or erectile dysfunction if you are male. How is this  diagnosed? This condition may be diagnosed with: A medical history. A physical exam. A series of tests that are done while you are sleeping (sleep study). These tests are usually done in a sleep lab, but they may also be done at home. How is this treated? Treatment for this condition aims to restore normal breathing and to ease symptoms during sleep. It may involve managing health issues that can affect breathing, such as high blood pressure or obesity. Treatment may include: Sleeping on your side. Using a decongestant if you have nasal congestion. Avoiding the use of depressants, including alcohol, sedatives, and narcotics. Losing weight if you are overweight. Making changes to your diet. Quitting smoking. Using a device to open your airway while you sleep, such as: An oral appliance. This is a custom-made mouthpiece that shifts your lower jaw forward. A continuous positive airway pressure (CPAP) device. This device blows air through a mask when you breathe out (exhale). A nasal expiratory positive airway pressure (EPAP) device. This device has valves that you put into each nostril. A bi-level positive airway pressure (BIPAP) device. This device blows air through a mask when you breathe in (inhale) and breathe out (exhale). Having surgery if other treatments do not work. During surgery, excess tissue is removed to create a wider airway. Follow these instructions at home: Lifestyle Make any lifestyle changes that your health care provider recommends. Eat a healthy, well-balanced diet. Take steps to lose weight if you are overweight. Avoid using depressants, including alcohol, sedatives, and narcotics. Do not use any products that contain nicotine or tobacco. These products include cigarettes, chewing tobacco, and vaping devices, such as e-cigarettes. If you need help quitting, ask   your health care provider. General instructions Take over-the-counter and prescription medicines only as told  by your health care provider. If you were given a device to open your airway while you sleep, use it only as told by your health care provider. If you are having surgery, make sure to tell your health care provider you have sleep apnea. You may need to bring your device with you. Keep all follow-up visits. This is important. Contact a health care provider if: The device that you received to open your airway during sleep is uncomfortable or does not seem to be working. Your symptoms do not improve. Your symptoms get worse. Get help right away if: You develop: Chest pain. Shortness of breath. Discomfort in your back, arms, or stomach. You have: Trouble speaking. Weakness on one side of your body. Drooping in your face. These symptoms may represent a serious problem that is an emergency. Do not wait to see if the symptoms will go away. Get medical help right away. Call your local emergency services (911 in the [place] Do not drive yourself to the hospital. Summary Sleep apnea is a condition in which breathing pauses or becomes shallow during sleep. The most common cause is a collapsed or blocked airway. The goal of treatment is to restore normal breathing and to ease symptoms during sleep. This information is not intended to replace advice given to you by your health care provider. Make sure you discuss any questions you have with your health care provider. Document Revised: 03/30/2021 Document Reviewed: 07/30/2020 Elsevier Patient Education  2023 Elsevier Inc.  

## 2022-08-30 NOTE — Progress Notes (Signed)
   Subjective:    Patient ID: Marcus Martinez, male    DOB: 18-Jul-1991, 31 y.o.   MRN: [id number]   Chief Complaint: Snores (Difficulty breathing in sleep/Large tonsils)   HPI Patent brought in by mom with c/o fatigue. Mom says he snores really bad and seems to wake hisself up at night. She thinks his tonsils are enlarged and that he may have sleep apnea.    Review of Systems  Constitutional:  Negative for diaphoresis.  Eyes:  Negative for pain.  Respiratory:  Negative for shortness of breath.   Cardiovascular:  Negative for chest pain, palpitations and leg swelling.  Gastrointestinal:  Negative for abdominal pain.  Endocrine: Negative for polydipsia.  Skin:  Negative for rash.  Neurological:  Negative for dizziness, weakness and headaches.  Hematological:  Does not bruise/bleed easily.  All other systems reviewed and are negative.      Objective:   Physical Exam Vitals and nursing note reviewed.  Constitutional:      Appearance: Normal appearance. He is well-developed.  HENT:     Head: Normocephalic.     Nose: Nose normal.     Mouth/Throat:     Mouth: Mucous membranes are moist.     Pharynx: Oropharynx is clear.     Comments: Extremely large tonsils Eyes:     Pupils: Pupils are equal, round, and reactive to light.  Neck:     Thyroid: No thyroid mass or thyromegaly.     Vascular: No carotid bruit or JVD.     Trachea: Phonation normal.  Cardiovascular:     Rate and Rhythm: Normal rate and regular rhythm.  Pulmonary:     Effort: Pulmonary effort is normal. No respiratory distress.     Breath sounds: Normal breath sounds.  Abdominal:     General: Bowel sounds are normal.     Palpations: Abdomen is soft.     Tenderness: There is no abdominal tenderness.  Musculoskeletal:        General: Normal range of motion.     Cervical back: Normal range of motion and neck supple.  Lymphadenopathy:     Cervical: No cervical adenopathy.  Skin:    General: Skin is warm and  dry.  Neurological:     Mental Status: He is alert and oriented to person, place, and time.  Psychiatric:        Behavior: Behavior normal.        Thought Content: Thought content normal.        Judgment: Judgment normal.    BP 126/87   Pulse (!) 110   Temp 98.1 F (36.7 C) (Temporal)   Resp 20   Ht 6' (1.829 m)   Wt 222 lb (100.7 kg)   SpO2 95%   BMI 30.11 kg/m         Assessment & Plan:   [name] in today with chief complaint of Snores (Difficulty breathing in sleep/Large tonsils)   1. Enlarged tonsils Referral to ENT - Ambulatory referral to ENT  2. Snoring Ordered sleep study - Home sleep test    The above assessment and management plan was discussed with the patient. The patient verbalized understanding of and has agreed to the management plan. Patient is aware to call the clinic if symptoms persist or worsen. Patient is aware when to return to the clinic for a follow-up visit. Patient educated on when it is appropriate to go to the emergency department.   Marcus Daphine Deutscher, FNP

## 2023-02-05 IMAGING — DX DG ABDOMEN 2V
3 series · 3 of 3 positions shown · non-contrast
Comparison: None.

CLINICAL DATA: Diarrhea

EXAM:
ABDOMEN - 2 VIEW

[abdomen erect]
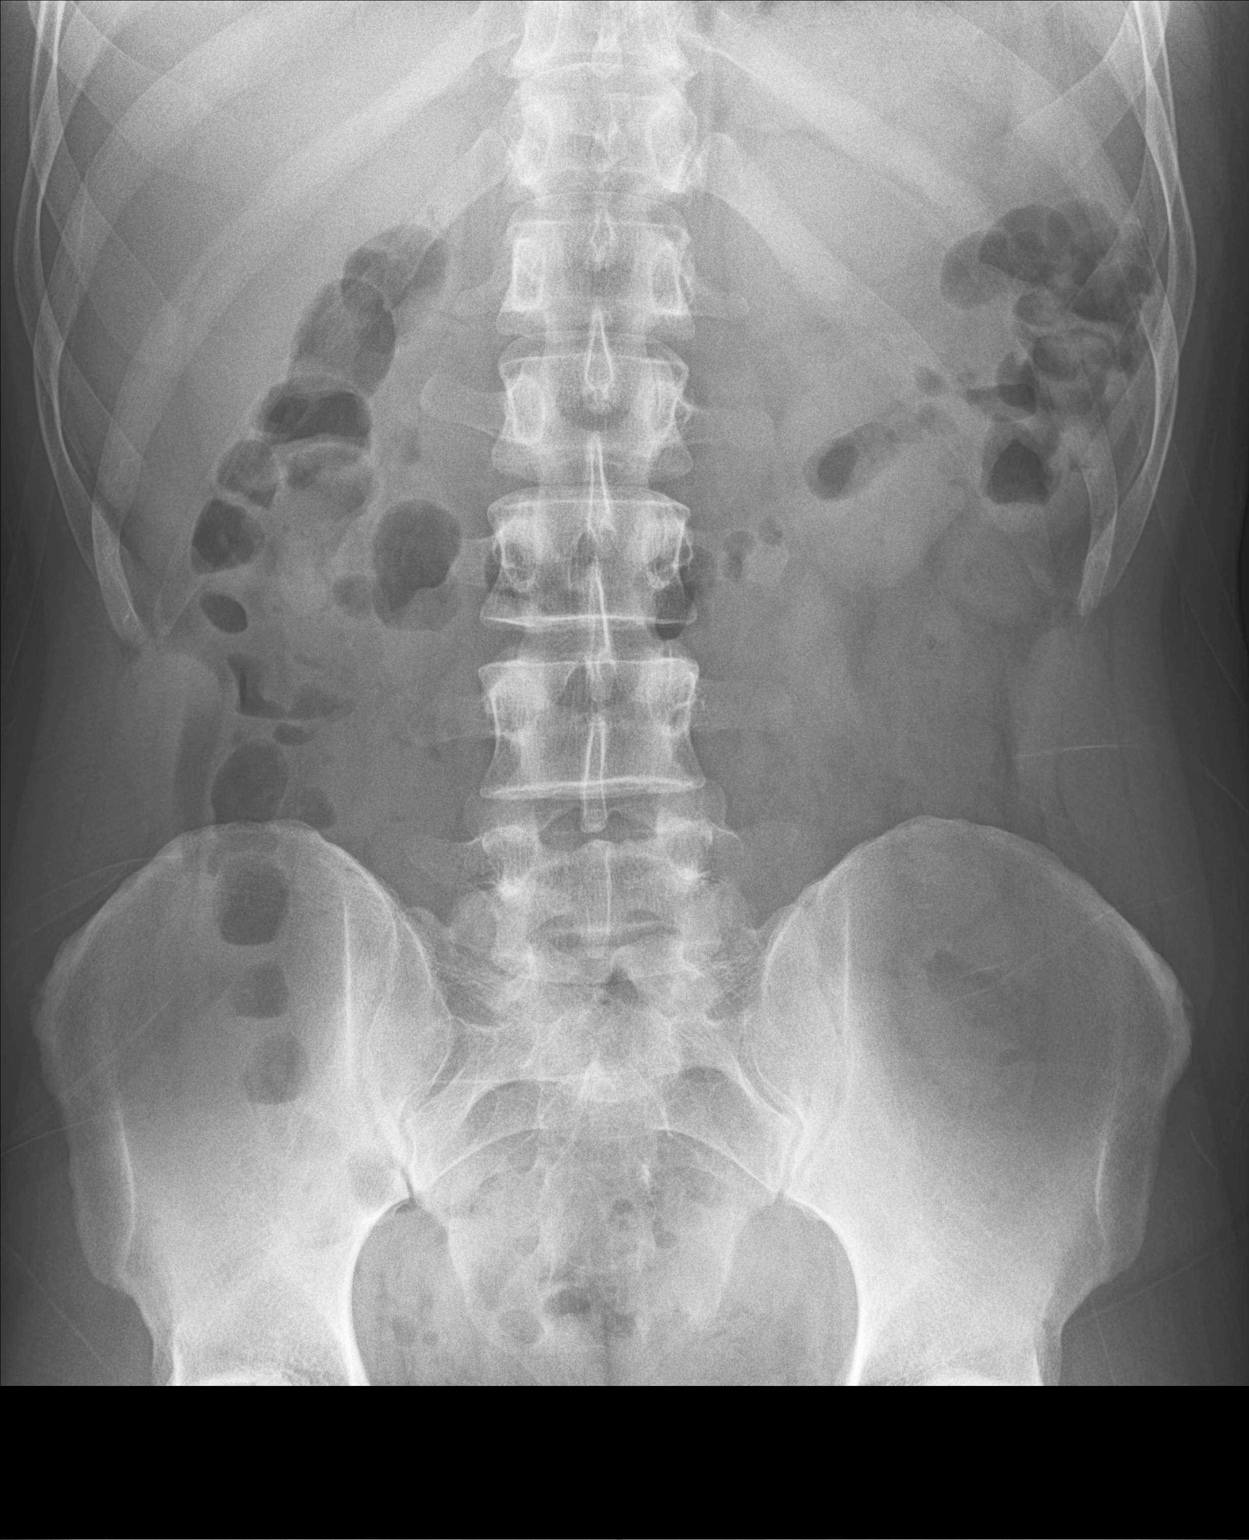

[abdomen supine (1 of 2)]
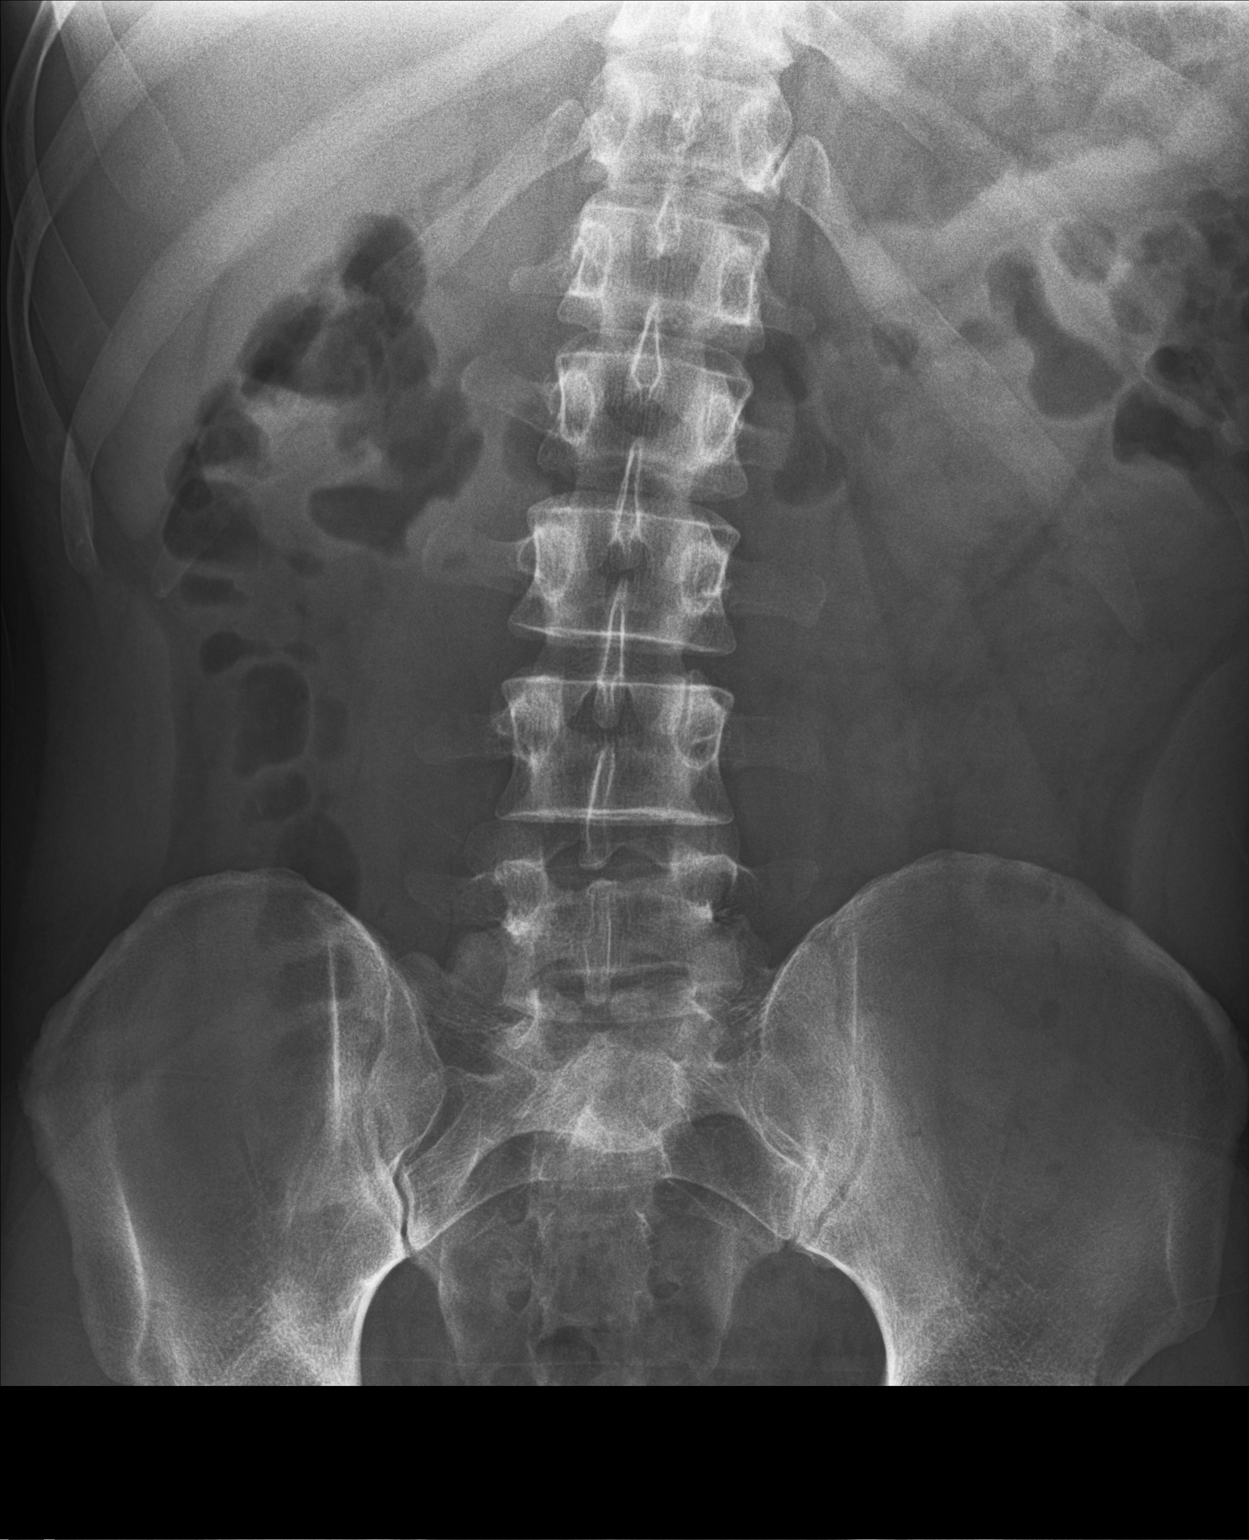

[abdomen supine (2 of 2)]
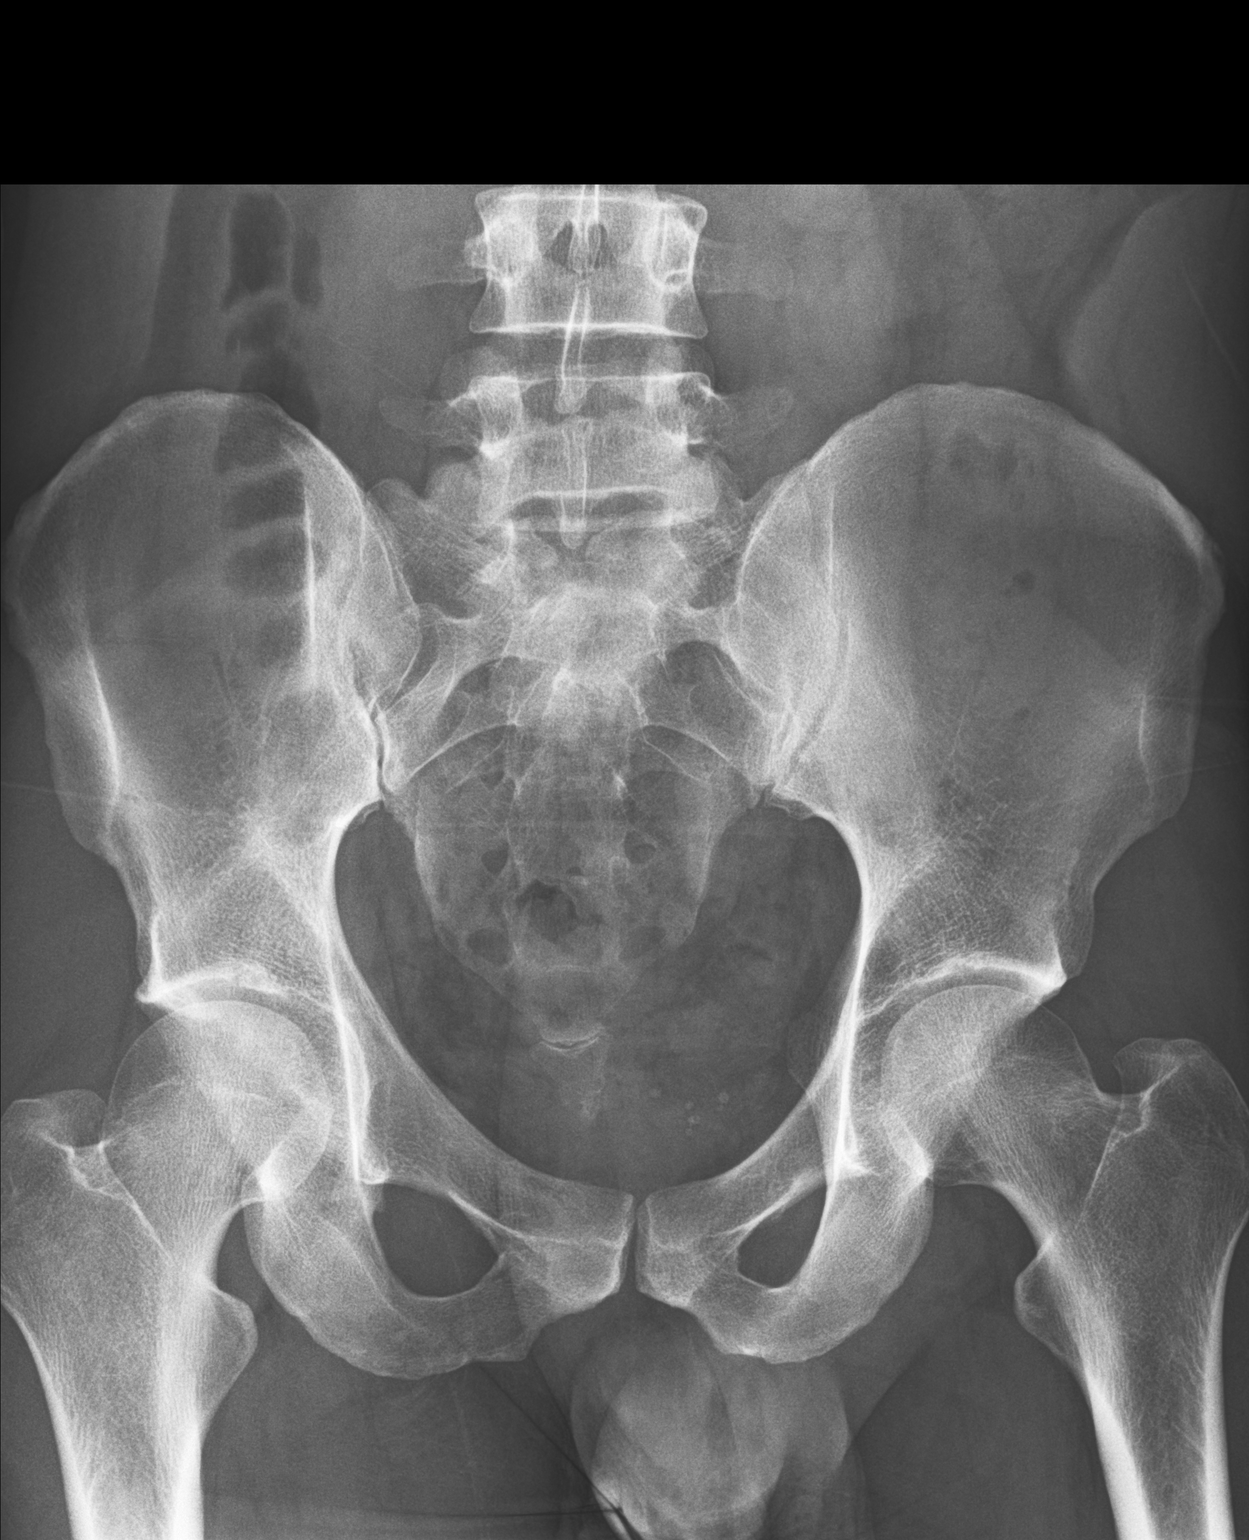

[3 of 3 positions shown; findings below may reference images not displayed]

FINDINGS: The bowel gas pattern is normal. There is no evidence of free air.
No radio-opaque calculi or other significant radiographic
abnormality is seen.
IMPRESSION: Negative.

## 2023-05-03 ENCOUNTER — Encounter: Payer: Self-pay | Admitting: Family Medicine

## 2023-05-03 ENCOUNTER — Ambulatory Visit (INDEPENDENT_AMBULATORY_CARE_PROVIDER_SITE_OTHER): Payer: 59 | Admitting: Family Medicine

## 2023-05-03 VITALS — BP 112/75 | HR 82 | Temp 97.6°F | Ht 72.0 in | Wt 229.0 lb

## 2023-05-03 DIAGNOSIS — J351 Hypertrophy of tonsils: Secondary | ICD-10-CM | POA: Diagnosis not present

## 2023-05-03 DIAGNOSIS — Z23 Encounter for immunization: Secondary | ICD-10-CM

## 2023-05-03 DIAGNOSIS — F250 Schizoaffective disorder, bipolar type: Secondary | ICD-10-CM

## 2023-05-03 DIAGNOSIS — Z136 Encounter for screening for cardiovascular disorders: Secondary | ICD-10-CM

## 2023-05-03 DIAGNOSIS — F419 Anxiety disorder, unspecified: Secondary | ICD-10-CM

## 2023-05-03 DIAGNOSIS — F429 Obsessive-compulsive disorder, unspecified: Secondary | ICD-10-CM

## 2023-05-03 DIAGNOSIS — F339 Major depressive disorder, recurrent, unspecified: Secondary | ICD-10-CM

## 2023-05-03 DIAGNOSIS — Z0001 Encounter for general adult medical examination with abnormal findings: Secondary | ICD-10-CM | POA: Diagnosis not present

## 2023-05-03 DIAGNOSIS — Z0000 Encounter for general adult medical examination without abnormal findings: Secondary | ICD-10-CM

## 2023-05-03 NOTE — Progress Notes (Signed)
Marcus Martinez is a 32 y.o. male presents to office today for annual physical exam examination.    Concerns today include: 1. None   Occupation: Chartered certified accountant in [place]  Marital status: not currently  Diet: homecooks, eats out 3 times per week, vegetables, fruits.  Exercise: not currently  Substance use: tobacco  Started cigarettes at 32 years old  1 pack per week.  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  Last dental exam: not currently  No significant family hx of UC, colon cancer  PSA: no significant history of prostate CA in family  Refills needed today: none  Other specialists seen: [facility] in [place]  Dermatology exam: due  Fasting today:  no  Immunizations needed: Flu Vaccine: no  Tdap Vaccine: due   - every 21yrs - (<3 lifetime doses or unknown): all wounds -- look up need for Tetanus IG - (>=3 lifetime doses): clean/minor wound if >64yrs from previous; all other wounds if >31yrs from previous Zoster Vaccine: no (those >50yo, once) Pneumonia Vaccine: no (those w/ risk factors) - (<95yr) Both: Immunocompromised, cochlear implant, CSF leak, asplenic, sickle cell, Chronic Renal Failure - (<17yr) PPSV-23 only: Heart dz, lung disease, DM, tobacco abuse, alcoholism, cirrhosis/liver disease. - (>21yr): PPSV13 then PPSV23 in 6-12mths;  - (>28yr): repeat PPSV23 once if pt received prior to 32yo and 24yrs have passed   Past Medical History:  Diagnosis Date   ADHD (attention deficit hyperactivity disorder)    GAD (generalized anxiety disorder)    Major depressive disorder    Social History   Socioeconomic History   Marital status: Single    Spouse name: Not on file   Number of children: Not on file   Years of education: Not on file   Highest education level: Not on file  Occupational History   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  Tobacco Use   Smoking status: Every Day    Current packs/day: 0.25    Types: Cigarettes   Smokeless tobacco: Never  Vaping Use    Vaping status: Never Used  Substance and Sexual Activity   Alcohol use: Yes    Comment: occasional   Drug use: Not Currently    Types: Marijuana   Sexual activity: Never  Other Topics Concern   Not on file  Social History Narrative   Not on file   Social Determinants of Health   Financial Resource Strain: Low Risk  (11/14/2017)   Overall Financial Resource Strain (CARDIA)    Difficulty of Paying Living Expenses: Not hard at all  Food Insecurity: No Food Insecurity (11/14/2017)   Hunger Vital Sign    Worried About Running Out of Food in the Last Year: Never true    Ran Out of Food in the Last Year: Never true  Transportation Needs: No Transportation Needs (11/14/2017)   PRAPARE - Administrator, Civil Service (Medical): No    Lack of Transportation (Non-Medical): No  Physical Activity: Inactive (11/14/2017)   Exercise Vital Sign    Days of Exercise per Week: 0 days    Minutes of Exercise per Session: 0 min  Stress: No Stress Concern Present (11/14/2017)   [employer] of Occupational Health - Occupational Stress Questionnaire    Feeling of Stress : Not at all  Social Connections: Somewhat Isolated (11/14/2017)   Social Connection and Isolation Panel [NHANES]    Frequency of Communication with Friends and Family: More than three times a week    Frequency of Social Gatherings with Friends  and Family: Once a week    Attends Religious Services: More than 4 times per year    Active Member of Clubs or Organizations: No    Attends Banker Meetings: Never    Marital Status: Never married  Intimate Partner Violence: Not At Risk (11/14/2017)   Humiliation, Afraid, Rape, and Kick questionnaire    Fear of Current or Ex-Partner: No    Emotionally Abused: No    Physically Abused: No    Sexually Abused: No   Past Surgical History:  Procedure Laterality Date   WISDOM TOOTH EXTRACTION     Family History  Problem Relation Age of Onset   Colon cancer Neg Hx     Esophageal cancer Neg Hx    Rectal cancer Neg Hx    Stomach cancer Neg Hx     Current Outpatient Medications:    atomoxetine (STRATTERA) 40 MG capsule, Take by mouth., Disp: , Rfl:    VRAYLAR 1.5 MG capsule, Take 1.5 mg by mouth daily., Disp: , Rfl:   Allergies  Allergen Reactions   Citalopram Diarrhea and Other (See Comments)    "Suicidal Thoughts"   Penicillins Rash   Pollen Extract Cough and Other (See Comments)    ROS: Review of Systems Review of Systems  All other systems reviewed and are negative.   Physical exam    05/03/2023    3:42 PM 08/30/2022    9:56 AM 06/16/2022   12:13 PM  Vitals with BMI  Height 6\' 0"  6\' 0"  6\' 0"   Weight 229 lbs 222 lbs 222 lbs 2 oz  BMI 31.05 30.1 30.12  Systolic 112 126 161  Diastolic 75 87 89  Pulse 82 110 102    Physical Exam Constitutional:      Appearance: Normal appearance. He is obese.  HENT:     Right Ear: No decreased hearing noted. No laceration, drainage, swelling or tenderness. No middle ear effusion. There is no impacted cerumen. No foreign body. No mastoid tenderness. Tympanic membrane is injected. Tympanic membrane is not scarred, perforated, erythematous, retracted or bulging.     Left Ear: No decreased hearing noted. No laceration, drainage, swelling or tenderness.  No middle ear effusion. There is no impacted cerumen. No foreign body. No mastoid tenderness. Tympanic membrane is injected. Tympanic membrane is not scarred, perforated, erythematous, retracted or bulging.     Nose: Nasal deformity and septal deviation present.     Right Turbinates: Not enlarged.     Left Turbinates: Not enlarged.     Comments: Slight deviation to the right     Mouth/Throat:     Lips: Pink.     Mouth: Mucous membranes are moist. No injury.     Pharynx: Oropharynx is clear. No oropharyngeal exudate or posterior oropharyngeal erythema.     Tonsils: No tonsillar exudate or tonsillar abscesses. 3+ on the right. 3+ on the left.  Eyes:      Conjunctiva/sclera: Conjunctivae normal.     Pupils: Pupils are equal, round, and reactive to light.  Neck:     Thyroid: No thyroid mass or thyromegaly.     Vascular: No carotid bruit.     Trachea: Trachea and phonation normal.  Cardiovascular:     Rate and Rhythm: Normal rate and regular rhythm.     Pulses: Normal pulses.          Radial pulses are 2+ on the right side and 2+ on the left side.     Heart sounds: Normal heart  sounds.  Pulmonary:     Effort: Pulmonary effort is normal. No respiratory distress.     Breath sounds: Normal breath sounds. No stridor, decreased air movement or transmitted upper airway sounds. No decreased breath sounds, wheezing or rhonchi.  Abdominal:     General: Abdomen is flat. Bowel sounds are normal. There is no distension.     Palpations: Abdomen is soft. There is no mass.     Tenderness: There is no abdominal tenderness. There is no guarding or rebound.     Hernia: No hernia is present.  Musculoskeletal:        General: No swelling, tenderness or deformity. Normal range of motion.     Cervical back: Normal range of motion and neck supple.     Right lower leg: No edema.     Left lower leg: No edema.  Lymphadenopathy:     Cervical: No cervical adenopathy.     Right cervical: No superficial cervical adenopathy.    Left cervical: No superficial cervical adenopathy.  Skin:    General: Skin is warm.     Capillary Refill: Capillary refill takes less than 2 seconds.     Coloration: Skin is not jaundiced or pale.     Findings: No bruising, erythema, lesion or rash.  Neurological:     General: No focal deficit present.     Mental Status: He is alert and oriented to person, place, and time. Mental status is at baseline.     Cranial Nerves: No cranial nerve deficit.     Motor: No weakness.     Gait: Gait normal.  Psychiatric:        Mood and Affect: Mood normal.        Behavior: Behavior normal.        Thought Content: Thought content normal.         Judgment: Judgment normal.     Assessment/ Plan: Marcus Martinez here for annual physical exam.  Marcus Martinez was seen today for annual exam.  Diagnoses and all orders for this visit:  Routine general medical examination at a health care facility Discussed with patient to continue healthy lifestyle choices, including diet (rich in fruits, vegetables, and lean proteins, and low in salt and simple carbohydrates) and exercise (at least 30 minutes of moderate physical activity daily). Limit beverages high is sugar. Recommended at least 80-100 oz of water daily.  Labs as below. Will communicate results to patient once available. Will await results to determine next steps.  Not fasting  -     CBC with Differential/Platelet -     TSH -     VITAMIN D 25 Hydroxy (Vit-D Deficiency, Fractures) -     CMP14+EGFR  Tonsillar hypertrophy Declines referral at this time. Does not have recurrent strep.   Schizoaffective disorder, bipolar type (HCC) Managed by [facility]. Continue current plan  Obsessive-compulsive disorder, unspecified type Managed by [facility]. Continue current plan  Depression, recurrent (HCC) Managed by [facility]. Continue current plan. Safety contract established. Denies SI.   Anxiety Managed by [facility]. Continue current plan. Safety contract established. Denies SI.   Encounter for screening for cardiovascular disorders -     Lipid panel  Need for Tdap vaccination -     Tdap vaccine greater than or equal to 7yo IM   Discussed with patient to continue healthy lifestyle choices, including diet (rich in fruits, vegetables, and lean proteins, and low in salt and simple carbohydrates) and exercise (at least 30 minutes of moderate physical activity daily).  Limit beverages high is sugar. Recommended at least 80-100 oz of water daily.   Patient to follow up in 1 year for annual exam or sooner if needed.  The above assessment and management plan was discussed with the patient. The patient  verbalized understanding of and has agreed to the management plan. Patient is aware to call the clinic if symptoms persist or worsen. Patient is aware when to return to the clinic for a follow-up visit. Patient educated on when it is appropriate to go to the emergency department.   Neale Burly, DNP-FNP [facility] [address] [place], [place] [address] [phone]

## 2023-05-03 NOTE — Patient Instructions (Signed)
Health Maintenance, Male Adopting a healthy lifestyle and getting preventive care are important in promoting health and wellness. Ask your health care provider about: The right schedule for you to have regular tests and exams. Things you can do on your own to prevent diseases and keep yourself healthy. What should I know about diet, weight, and exercise? Eat a healthy diet  Eat a diet that includes plenty of vegetables, fruits, low-fat dairy products, and lean protein. Do not eat a lot of foods that are high in solid fats, added sugars, or sodium. Maintain a healthy weight Body mass index (BMI) is a measurement that can be used to identify possible weight problems. It estimates body fat based on height and weight. Your health care provider can help determine your BMI and help you achieve or maintain a healthy weight. Get regular exercise Get regular exercise. This is one of the most important things you can do for your health. Most adults should: Exercise for at least 150 minutes each week. The exercise should increase your heart rate and make you sweat (moderate-intensity exercise). Do strengthening exercises at least twice a week. This is in addition to the moderate-intensity exercise. Spend less time sitting. Even light physical activity can be beneficial. Watch cholesterol and blood lipids Have your blood tested for lipids and cholesterol at 32 years of age, then have this test every 5 years. You may need to have your cholesterol levels checked more often if: Your lipid or cholesterol levels are high. You are older than 32 years of age. You are at high risk for heart disease. What should I know about cancer screening? Many types of cancers can be detected early and may often be prevented. Depending on your health history and family history, you may need to have cancer screening at various ages. This may include screening for: Colorectal cancer. Prostate cancer. Skin cancer. Lung  cancer. What should I know about heart disease, diabetes, and high blood pressure? Blood pressure and heart disease High blood pressure causes heart disease and increases the risk of stroke. This is more likely to develop in people who have high blood pressure readings or are overweight. Talk with your health care provider about your target blood pressure readings. Have your blood pressure checked: Every 3-5 years if you are 18-39 years of age. Every year if you are 40 years old or older. If you are between the ages of 65 and 75 and are a current or former smoker, ask your health care provider if you should have a one-time screening for abdominal aortic aneurysm (AAA). Diabetes Have regular diabetes screenings. This checks your fasting blood sugar level. Have the screening done: Once every three years after age 45 if you are at a normal weight and have a low risk for diabetes. More often and at a younger age if you are overweight or have a high risk for diabetes. What should I know about preventing infection? Hepatitis B If you have a higher risk for hepatitis B, you should be screened for this virus. Talk with your health care provider to find out if you are at risk for hepatitis B infection. Hepatitis C Blood testing is recommended for: Everyone born from 1945 through 1965. Anyone with known risk factors for hepatitis C. Sexually transmitted infections (STIs) You should be screened each year for STIs, including gonorrhea and chlamydia, if: You are sexually active and are younger than 32 years of age. You are older than 32 years of age and your   health care provider tells you that you are at risk for this type of infection. Your sexual activity has changed since you were last screened, and you are at increased risk for chlamydia or gonorrhea. Ask your health care provider if you are at risk. Ask your health care provider about whether you are at high risk for HIV. Your health care provider  may recommend a prescription medicine to help prevent HIV infection. If you choose to take medicine to prevent HIV, you should first get tested for HIV. You should then be tested every 3 months for as long as you are taking the medicine. Follow these instructions at home: Alcohol use Do not drink alcohol if your health care provider tells you not to drink. If you drink alcohol: Limit how much you have to 0-2 drinks a day. Know how much alcohol is in your drink. In the [place] one drink equals one 12 oz bottle of beer (355 mL), one 5 oz glass of wine (148 mL), or one 1 oz glass of hard liquor (44 mL). Lifestyle Do not use any products that contain nicotine or tobacco. These products include cigarettes, chewing tobacco, and vaping devices, such as e-cigarettes. If you need help quitting, ask your health care provider. Do not use street drugs. Do not share needles. Ask your health care provider for help if you need support or information about quitting drugs. General instructions Schedule regular health, dental, and eye exams. Stay current with your vaccines. Tell your health care provider if: You often feel depressed. You have ever been abused or do not feel safe at home. Summary Adopting a healthy lifestyle and getting preventive care are important in promoting health and wellness. Follow your health care provider's instructions about healthy diet, exercising, and getting tested or screened for diseases. Follow your health care provider's instructions on monitoring your cholesterol and blood pressure. This information is not intended to replace advice given to you by your health care provider. Make sure you discuss any questions you have with your health care provider. Document Revised: 01/10/2021 Document Reviewed: 01/10/2021 Elsevier Patient Education  2024 Elsevier Inc.  

## 2023-05-04 LAB — CBC WITH DIFFERENTIAL/PLATELET
Basophils Absolute: 0 10*3/uL (ref 0.0–0.2)
Basos: 1 %
EOS (ABSOLUTE): 0.1 10*3/uL (ref 0.0–0.4)
Eos: 2 %
Hematocrit: 49.2 % (ref 37.5–51.0)
Hemoglobin: 16.1 g/dL (ref 13.0–17.7)
Immature Grans (Abs): 0 10*3/uL (ref 0.0–0.1)
Immature Granulocytes: 1 %
Lymphocytes Absolute: 2.3 10*3/uL (ref 0.7–3.1)
Lymphs: 36 %
MCH: 29.9 pg (ref 26.6–33.0)
MCHC: 32.7 g/dL (ref 31.5–35.7)
MCV: 91 fL (ref 79–97)
Monocytes Absolute: 0.6 10*3/uL (ref 0.1–0.9)
Monocytes: 10 %
Neutrophils Absolute: 3.3 10*3/uL (ref 1.4–7.0)
Neutrophils: 50 %
Platelets: 230 10*3/uL (ref 150–450)
RBC: 5.38 x10E6/uL (ref 4.14–5.80)
RDW: 13.2 % (ref 11.6–15.4)
WBC: 6.4 10*3/uL (ref 3.4–10.8)

## 2023-05-04 LAB — CMP14+EGFR
ALT: 73 IU/L — ABNORMAL HIGH (ref 0–44)
AST: 35 IU/L (ref 0–40)
Albumin: 4.3 g/dL (ref 4.1–5.1)
Alkaline Phosphatase: 105 IU/L (ref 44–121)
BUN/Creatinine Ratio: 12 (ref 9–20)
BUN: 10 mg/dL (ref 6–20)
Bilirubin Total: 0.4 mg/dL (ref 0.0–1.2)
CO2: 19 mmol/L — ABNORMAL LOW (ref 20–29)
Calcium: 9.5 mg/dL (ref 8.7–10.2)
Chloride: 104 mmol/L (ref 96–106)
Creatinine, Ser: 0.84 mg/dL (ref 0.76–1.27)
Globulin, Total: 2.9 g/dL (ref 1.5–4.5)
Glucose: 83 mg/dL (ref 70–99)
Potassium: 4.1 mmol/L (ref 3.5–5.2)
Sodium: 138 mmol/L (ref 134–144)
Total Protein: 7.2 g/dL (ref 6.0–8.5)
eGFR: 119 mL/min/{1.73_m2} (ref >59)

## 2023-05-04 LAB — LIPID PANEL
Chol/HDL Ratio: 10.4 ratio — ABNORMAL HIGH (ref 0.0–5.0)
Cholesterol, Total: 207 mg/dL — ABNORMAL HIGH (ref 100–199)
HDL: 20 mg/dL — ABNORMAL LOW (ref >39)
LDL Chol Calc (NIH): 118 mg/dL — ABNORMAL HIGH (ref 0–99)
Triglycerides: 394 mg/dL — ABNORMAL HIGH (ref 0–149)
VLDL Cholesterol Cal: 69 mg/dL — ABNORMAL HIGH (ref 5–40)

## 2023-05-04 LAB — VITAMIN D 25 HYDROXY (VIT D DEFICIENCY, FRACTURES): Vit D, 25-Hydroxy: 23.2 ng/mL — ABNORMAL LOW (ref 30.0–100.0)

## 2023-05-04 LAB — TSH: TSH: 2.14 u[IU]/mL (ref 0.450–4.500)

## 2023-05-08 NOTE — Progress Notes (Signed)
Cholesterol is elevated. Diet encouraged - increase intake of fresh fruits and vegetables, increase intake of lean proteins. Bake, broil, or grill foods. Avoid fried, greasy, and fatty foods. Avoid fast foods. Increase intake of fiber-rich whole grains. Exercise encouraged - at least 150 minutes per week and advance as tolerated. Can also try red yeast rice and we will recheck in 3 months. Patient cholesterol was elevated last year, may want to consider a statin.  Vitamin D is low. Recommend [phone] international units daily.  CO2 slightly low. Some variation in labs is expected.  ALT (liver enzymes) slightly elevated. Recommend patient avoid tylenol and alcohol

## 2023-05-11 ENCOUNTER — Ambulatory Visit: Payer: 59 | Admitting: Family Medicine

## 2023-05-11 ENCOUNTER — Encounter: Payer: Self-pay | Admitting: Family Medicine

## 2023-05-11 VITALS — BP 143/86 | HR 89 | Temp 97.3°F | Ht 73.0 in | Wt 230.4 lb

## 2023-05-11 DIAGNOSIS — I10 Essential (primary) hypertension: Secondary | ICD-10-CM | POA: Diagnosis not present

## 2023-05-11 DIAGNOSIS — L03032 Cellulitis of left toe: Secondary | ICD-10-CM | POA: Diagnosis not present

## 2023-05-11 MED ORDER — SULFAMETHOXAZOLE-TRIMETHOPRIM 800-160 MG PO TABS
1.0000 | ORAL_TABLET | Freq: Two times a day (BID) | ORAL | 0 refills | Status: AC
Start: 2023-05-11 — End: 2023-05-18

## 2023-05-11 NOTE — Progress Notes (Signed)
Subjective:  Patient ID: Marcus Martinez, male    DOB: June 12, 1991, 32 y.o.   MRN: [id number]  Patient Care Team: Arrie Senate, FNP as PCP - General (Family Medicine)   Chief Complaint:  Ingrown Toenail (Left grate toe x 2-3 days )  HPI: Marcus Martinez is a 32 y.o. male presenting on 05/11/2023 for Ingrown Toenail (Left grate toe x 2-3 days )   HPI States that it started 2-3 days ago. Tried tylenol for pain. Denies fever. Endorses drainage. Started epsom salt soaks. States that he has an appt next Friday with podiatry.    Relevant past medical, surgical, family, and social history reviewed and updated as indicated.  Allergies and medications reviewed and updated. Data reviewed: Chart in Epic.   Past Medical History:  Diagnosis Date   ADHD (attention deficit hyperactivity disorder)    GAD (generalized anxiety disorder)    Major depressive disorder     Past Surgical History:  Procedure Laterality Date   WISDOM TOOTH EXTRACTION      Social History   Socioeconomic History   Marital status: Single    Spouse name: Not on file   Number of children: Not on file   Years of education: Not on file   Highest education level: Not on file  Occupational History   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  Tobacco Use   Smoking status: Every Day    Current packs/day: 0.25    Types: Cigarettes   Smokeless tobacco: Never  Vaping Use   Vaping status: Never Used  Substance and Sexual Activity   Alcohol use: Yes    Comment: occasional   Drug use: Not Currently    Types: Marijuana   Sexual activity: Never  Other Topics Concern   Not on file  Social History Narrative   Not on file   Social Determinants of Health   Financial Resource Strain: Low Risk  (11/14/2017)   Overall Financial Resource Strain (CARDIA)    Difficulty of Paying Living Expenses: Not hard at all  Food Insecurity: No Food Insecurity (11/14/2017)   Hunger Vital Sign    Worried About Running Out of Food  in the Last Year: Never true    Ran Out of Food in the Last Year: Never true  Transportation Needs: No Transportation Needs (11/14/2017)   PRAPARE - Administrator, Civil Service (Medical): No    Lack of Transportation (Non-Medical): No  Physical Activity: Inactive (11/14/2017)   Exercise Vital Sign    Days of Exercise per Week: 0 days    Minutes of Exercise per Session: 0 min  Stress: No Stress Concern Present (11/14/2017)   [employer] of Occupational Health - Occupational Stress Questionnaire    Feeling of Stress : Not at all  Social Connections: Somewhat Isolated (11/14/2017)   Social Connection and Isolation Panel [NHANES]    Frequency of Communication with Friends and Family: More than three times a week    Frequency of Social Gatherings with Friends and Family: Once a week    Attends Religious Services: More than 4 times per year    Active Member of [employer] or Organizations: No    Attends Banker Meetings: Never    Marital Status: Never married  Intimate Partner Violence: Not At Risk (11/14/2017)   Humiliation, Afraid, Rape, and Kick questionnaire    Fear of Current or Ex-Partner: No    Emotionally Abused: No    Physically Abused: No    Sexually Abused:  No    Outpatient Encounter Medications as of 05/11/2023  Medication Sig   atomoxetine (STRATTERA) 40 MG capsule Take by mouth.   VRAYLAR 1.5 MG capsule Take 1.5 mg by mouth daily.   [DISCONTINUED] buPROPion (WELLBUTRIN SR) 150 MG 12 hr tablet Take 1 tablet (150 mg total) by mouth 2 (two) times daily.   No facility-administered encounter medications on file as of 05/11/2023.    Allergies  Allergen Reactions   Citalopram Diarrhea and Other (See Comments)    "Suicidal Thoughts"   Penicillins Rash   Pollen Extract Cough and Other (See Comments)    Review of Systems As per HPI  Objective:  BP (!) 143/86   Pulse 89   Temp (!) 97.3 F (36.3 C) (Temporal)   Ht 6\' 1"  (1.854 m)   Wt 230 lb 6.4 oz  (104.5 kg)   SpO2 99%   BMI 30.40 kg/m    Wt Readings from Last 3 Encounters:  05/11/23 230 lb 6.4 oz (104.5 kg)  05/03/23 229 lb (103.9 kg)  08/30/22 222 lb (100.7 kg)   Physical Exam Constitutional:      General: He is awake. He is not in acute distress.    Appearance: Normal appearance. He is well-developed and well-groomed. He is not ill-appearing, toxic-appearing or diaphoretic.  Cardiovascular:     Rate and Rhythm: Normal rate.     Pulses: Normal pulses.          Radial pulses are 2+ on the right side and 2+ on the left side.       Posterior tibial pulses are 2+ on the right side and 2+ on the left side.     Heart sounds: Normal heart sounds. No murmur heard.    No gallop.  Pulmonary:     Effort: Pulmonary effort is normal. No respiratory distress.     Breath sounds: Normal breath sounds. No stridor. No wheezing, rhonchi or rales.  Musculoskeletal:     Cervical back: Full passive range of motion without pain and neck supple.     Right lower leg: No edema.     Left lower leg: No edema.  Feet:     Left foot:     Toenail Condition: Left toenails are ingrown.     Comments: Erythema and swelling on lateral aspect of left great toe with drainage of purulent fluid  Skin:    General: Skin is warm.     Capillary Refill: Capillary refill takes less than 2 seconds.  Neurological:     General: No focal deficit present.     Mental Status: He is alert, oriented to person, place, and time and easily aroused. Mental status is at baseline.     GCS: GCS eye subscore is 4. GCS verbal subscore is 5. GCS motor subscore is 6.     Motor: No weakness.  Psychiatric:        Attention and Perception: Attention and perception normal.        Mood and Affect: Mood and affect normal.        Speech: Speech normal.        Behavior: Behavior normal. Behavior is cooperative.        Thought Content: Thought content normal. Thought content does not include homicidal or suicidal ideation. Thought  content does not include homicidal or suicidal plan.        Cognition and Memory: Cognition and memory normal.        Judgment: Judgment normal.  Results for orders placed or performed in visit on 05/03/23  CBC with Differential/Platelet  Result Value Ref Range   WBC 6.4 3.4 - 10.8 x10E3/uL   RBC 5.38 4.14 - 5.80 x10E6/uL   Hemoglobin 16.1 13.0 - 17.7 g/dL   Hematocrit 16.1 09.6 - 51.0 %   MCV 91 79 - 97 fL   MCH 29.9 26.6 - 33.0 pg   MCHC 32.7 31.5 - 35.7 g/dL   RDW 04.5 40.9 - 81.1 %   Platelets 230 150 - 450 x10E3/uL   Neutrophils 50 Not Estab. %   Lymphs 36 Not Estab. %   Monocytes 10 Not Estab. %   Eos 2 Not Estab. %   Basos 1 Not Estab. %   Neutrophils Absolute 3.3 1.4 - 7.0 x10E3/uL   Lymphocytes Absolute 2.3 0.7 - 3.1 x10E3/uL   Monocytes Absolute 0.6 0.1 - 0.9 x10E3/uL   EOS (ABSOLUTE) 0.1 0.0 - 0.4 x10E3/uL   Basophils Absolute 0.0 0.0 - 0.2 x10E3/uL   Immature Granulocytes 1 Not Estab. %   Immature Grans (Abs) 0.0 0.0 - 0.1 x10E3/uL  Lipid panel  Result Value Ref Range   Cholesterol, Total 207 (H) 100 - 199 mg/dL   Triglycerides 914 (H) 0 - 149 mg/dL   HDL 20 (L) >78 mg/dL   VLDL Cholesterol Cal 69 (H) 5 - 40 mg/dL   LDL Chol Calc (NIH) 295 (H) 0 - 99 mg/dL   Chol/HDL Ratio 62.1 (H) 0.0 - 5.0 ratio  TSH  Result Value Ref Range   TSH 2.140 0.450 - 4.500 uIU/mL  VITAMIN D 25 Hydroxy (Vit-D Deficiency, Fractures)  Result Value Ref Range   Vit D, 25-Hydroxy 23.2 (L) 30.0 - 100.0 ng/mL  CMP14+EGFR  Result Value Ref Range   Glucose 83 70 - 99 mg/dL   BUN 10 6 - 20 mg/dL   Creatinine, Ser 3.08 0.76 - 1.27 mg/dL   eGFR 657 >84 ON/GEX/5.28   BUN/Creatinine Ratio 12 9 - 20   Sodium 138 134 - 144 mmol/L   Potassium 4.1 3.5 - 5.2 mmol/L   Chloride 104 96 - 106 mmol/L   CO2 19 (L) 20 - 29 mmol/L   Calcium 9.5 8.7 - 10.2 mg/dL   Total Protein 7.2 6.0 - 8.5 g/dL   Albumin 4.3 4.1 - 5.1 g/dL   Globulin, Total 2.9 1.5 - 4.5 g/dL   Bilirubin Total 0.4 0.0 -  1.2 mg/dL   Alkaline Phosphatase 105 44 - 121 IU/L   AST 35 0 - 40 IU/L   ALT 73 (H) 0 - 44 IU/L       05/03/2023    3:45 PM 08/30/2022    9:57 AM 04/25/2022    3:43 PM 10/25/2020    9:27 AM 06/29/2018    8:58 AM  Depression screen PHQ 2/9  Decreased Interest 0 0 3 0 0  Down, Depressed, Hopeless 0 0 3 0 0  PHQ - 2 Score 0 0 6 0 0  Altered sleeping 0 3 3    Tired, decreased energy 0 1 0    Change in appetite 0 0 3    Feeling bad or failure about yourself  0 0 3    Trouble concentrating 0 0 0    Moving slowly or fidgety/restless 0 0 2    Suicidal thoughts 0 0 0    PHQ-9 Score 0 4 17    Difficult doing work/chores  Not difficult at all Extremely dIfficult  05/03/2023    3:45 PM 08/30/2022    9:58 AM 04/25/2022    3:44 PM 01/31/2018    1:55 PM  GAD 7 : Generalized Anxiety Score  Nervous, Anxious, on Edge 0 0 3   Control/stop worrying 0 0 3   Worry too much - different things 0 0 3   Trouble relaxing 0 0 2   Restless 0 0 3   Easily annoyed or irritable 0 0 3   Afraid - awful might happen 0 0 3   Total GAD 7 Score 0 0 20   Anxiety Difficulty  Not difficult at all       Information is confidential and restricted. Go to Review Flowsheets to unlock data.   Pertinent labs & imaging results that were available during my care of the patient were reviewed by me and considered in my medical decision making.  Assessment & Plan:  Marcus Martinez was seen today for ingrown toenail.  Diagnoses and all orders for this visit:  Paronychia of great toe of left foot Will start medication as below. Continue Epsom salt soaks. Provided bandaging for patient.  -     sulfamethoxazole-trimethoprim (BACTRIM DS) 800-160 MG tablet; Take 1 tablet by mouth 2 (two) times daily for 7 days.  Primary hypertension Elevated BP in office today. Patient to monitor BP at home and bring log to follow up.    Continue all other maintenance medications.  Follow up plan: Return in about 1 week (around  05/18/2023) for follow up toe.   Continue healthy lifestyle choices, including diet (rich in fruits, vegetables, and lean proteins, and low in salt and simple carbohydrates) and exercise (at least 30 minutes of moderate physical activity daily).  Written and verbal instructions provided   The above assessment and management plan was discussed with the patient. The patient verbalized understanding of and has agreed to the management plan. Patient is aware to call the clinic if they develop any new symptoms or if symptoms persist or worsen. Patient is aware when to return to the clinic for a follow-up visit. Patient educated on when it is appropriate to go to the emergency department.   Neale Burly, DNP-FNP [facility] [address] [place], [place] [address] [phone]

## 2023-05-17 ENCOUNTER — Ambulatory Visit: Payer: 59 | Admitting: Family Medicine

## 2023-05-18 ENCOUNTER — Ambulatory Visit: Payer: 59 | Admitting: Family Medicine

## 2023-05-29 ENCOUNTER — Encounter: Payer: Self-pay | Admitting: Family Medicine

## 2023-05-29 ENCOUNTER — Ambulatory Visit: Payer: 59 | Admitting: Family Medicine

## 2023-05-29 ENCOUNTER — Other Ambulatory Visit: Payer: Self-pay | Admitting: Family Medicine

## 2023-05-29 VITALS — BP 131/76 | HR 90 | Temp 98.3°F | Ht 73.0 in | Wt 231.0 lb

## 2023-05-29 DIAGNOSIS — R1111 Vomiting without nausea: Secondary | ICD-10-CM

## 2023-05-29 MED ORDER — OMEPRAZOLE 40 MG PO CPDR
40.0000 mg | DELAYED_RELEASE_CAPSULE | Freq: Every day | ORAL | 2 refills | Status: DC
Start: 2023-05-29 — End: 2024-06-03

## 2023-05-29 NOTE — Progress Notes (Signed)
Subjective:  Patient ID: Marcus Martinez, male    DOB: 10-15-1990, 32 y.o.   MRN: [id number]  Patient Care Team: Ellamae Sia Aleen Campi, FNP as PCP - General (Family Medicine)   Chief Complaint:  Emesis (After meals/)  HPI: Marcus Martinez is a 32 y.o. male presenting on 05/29/2023 for Emesis (After meals/) Reports that he has had vomiting episodes starting one month ago.  Happens with lunch and dinner regardless of what he is eating. Happens 20 minutes after eating.  Denies any trouble swallowing and choking on foods  Endorses belching and heartburn after eating.  Has not had symptom such as these in the past.  Denies hematochezia/hematemesis.  Denies fever and diarrhea. States that he does not feel nauseous as he is eating.  States that in the mornings he eats instant oatmeal and that is okay.  Reports normal, daily BM 1-2 per day   Relevant past medical, surgical, family, and social history reviewed and updated as indicated.  Allergies and medications reviewed and updated. Data reviewed: Chart in Epic.  Past Medical History:  Diagnosis Date   ADHD (attention deficit hyperactivity disorder)    GAD (generalized anxiety disorder)    Major depressive disorder     Past Surgical History:  Procedure Laterality Date   WISDOM TOOTH EXTRACTION      Social History   Socioeconomic History   Marital status: Single    Spouse name: Not on file   Number of children: Not on file   Years of education: Not on file   Highest education level: Not on file  Occupational History   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  Tobacco Use   Smoking status: Every Day    Current packs/day: 0.25    Types: Cigarettes   Smokeless tobacco: Never  Vaping Use   Vaping status: Never Used  Substance and Sexual Activity   Alcohol use: Yes    Comment: occasional   Drug use: Not Currently    Types: Marijuana   Sexual activity: Never  Other Topics Concern   Not on file  Social History Narrative    Not on file   Social Determinants of Health   Financial Resource Strain: Low Risk  (11/14/2017)   Overall Financial Resource Strain (CARDIA)    Difficulty of Paying Living Expenses: Not hard at all  Food Insecurity: No Food Insecurity (11/14/2017)   Hunger Vital Sign    Worried About Running Out of Food in the Last Year: Never true    Ran Out of Food in the Last Year: Never true  Transportation Needs: No Transportation Needs (11/14/2017)   PRAPARE - Administrator, Civil Service (Medical): No    Lack of Transportation (Non-Medical): No  Physical Activity: Inactive (11/14/2017)   Exercise Vital Sign    Days of Exercise per Week: 0 days    Minutes of Exercise per Session: 0 min  Stress: No Stress Concern Present (11/14/2017)   [employer] of Occupational Health - Occupational Stress Questionnaire    Feeling of Stress : Not at all  Social Connections: Somewhat Isolated (11/14/2017)   Social Connection and Isolation Panel [NHANES]    Frequency of Communication with Friends and Family: More than three times a week    Frequency of Social Gatherings with Friends and Family: Once a week    Attends Religious Services: More than 4 times per year    Active Member of [employer] or Organizations: No    Attends Banker Meetings:  Never    Marital Status: Never married  Intimate Partner Violence: Not At Risk (11/14/2017)   Humiliation, Afraid, Rape, and Kick questionnaire    Fear of Current or Ex-Partner: No    Emotionally Abused: No    Physically Abused: No    Sexually Abused: No    Outpatient Encounter Medications as of 05/29/2023  Medication Sig   atomoxetine (STRATTERA) 40 MG capsule Take by mouth.   VRAYLAR 1.5 MG capsule Take 1.5 mg by mouth daily.   [DISCONTINUED] buPROPion (WELLBUTRIN SR) 150 MG 12 hr tablet Take 1 tablet (150 mg total) by mouth 2 (two) times daily.   No facility-administered encounter medications on file as of 05/29/2023.    Allergies   Allergen Reactions   Citalopram Diarrhea and Other (See Comments)    "Suicidal Thoughts"   Penicillins Rash   Pollen Extract Cough and Other (See Comments)    Review of Systems As per HPI  Objective:  BP 131/76   Pulse 90   Temp 98.3 F (36.8 C)   Ht 6\' 1"  (1.854 m)   Wt 231 lb (104.8 kg)   SpO2 96%   BMI 30.48 kg/m    Wt Readings from Last 3 Encounters:  05/29/23 231 lb (104.8 kg)  05/11/23 230 lb 6.4 oz (104.5 kg)  05/03/23 229 lb (103.9 kg)    Physical Exam Constitutional:      General: He is awake. He is not in acute distress.    Appearance: Normal appearance. He is well-developed and well-groomed. He is obese. He is not ill-appearing, toxic-appearing or diaphoretic.  Cardiovascular:     Rate and Rhythm: Normal rate and regular rhythm.     Pulses: Normal pulses.          Radial pulses are 2+ on the right side and 2+ on the left side.       Posterior tibial pulses are 2+ on the right side and 2+ on the left side.     Heart sounds: Normal heart sounds. No murmur heard.    No gallop.  Pulmonary:     Effort: Pulmonary effort is normal. No respiratory distress.     Breath sounds: Normal breath sounds. No stridor. No wheezing, rhonchi or rales.  Abdominal:     General: Abdomen is flat. Bowel sounds are normal. There is no distension.     Palpations: Abdomen is soft. There is no mass.     Tenderness: There is no abdominal tenderness. There is no guarding or rebound.     Hernia: No hernia is present.  Musculoskeletal:     Cervical back: Full passive range of motion without pain and neck supple.     Right lower leg: No edema.     Left lower leg: No edema.  Skin:    General: Skin is warm.     Capillary Refill: Capillary refill takes less than 2 seconds.  Neurological:     General: No focal deficit present.     Mental Status: He is alert, oriented to person, place, and time and easily aroused. Mental status is at baseline.     GCS: GCS eye subscore is 4. GCS verbal  subscore is 5. GCS motor subscore is 6.     Motor: No weakness.  Psychiatric:        Attention and Perception: Attention and perception normal.        Mood and Affect: Mood and affect normal.        Speech: Speech normal.  Behavior: Behavior normal. Behavior is cooperative.        Thought Content: Thought content normal. Thought content does not include homicidal or suicidal ideation. Thought content does not include homicidal or suicidal plan.        Cognition and Memory: Cognition and memory normal.        Judgment: Judgment normal.     Results for orders placed or performed in visit on 05/03/23  CBC with Differential/Platelet  Result Value Ref Range   WBC 6.4 3.4 - 10.8 x10E3/uL   RBC 5.38 4.14 - 5.80 x10E6/uL   Hemoglobin 16.1 13.0 - 17.7 g/dL   Hematocrit 65.7 84.6 - 51.0 %   MCV 91 79 - 97 fL   MCH 29.9 26.6 - 33.0 pg   MCHC 32.7 31.5 - 35.7 g/dL   RDW 96.2 95.2 - 84.1 %   Platelets 230 150 - 450 x10E3/uL   Neutrophils 50 Not Estab. %   Lymphs 36 Not Estab. %   Monocytes 10 Not Estab. %   Eos 2 Not Estab. %   Basos 1 Not Estab. %   Neutrophils Absolute 3.3 1.4 - 7.0 x10E3/uL   Lymphocytes Absolute 2.3 0.7 - 3.1 x10E3/uL   Monocytes Absolute 0.6 0.1 - 0.9 x10E3/uL   EOS (ABSOLUTE) 0.1 0.0 - 0.4 x10E3/uL   Basophils Absolute 0.0 0.0 - 0.2 x10E3/uL   Immature Granulocytes 1 Not Estab. %   Immature Grans (Abs) 0.0 0.0 - 0.1 x10E3/uL  Lipid panel  Result Value Ref Range   Cholesterol, Total 207 (H) 100 - 199 mg/dL   Triglycerides 324 (H) 0 - 149 mg/dL   HDL 20 (L) >40 mg/dL   VLDL Cholesterol Cal 69 (H) 5 - 40 mg/dL   LDL Chol Calc (NIH) 102 (H) 0 - 99 mg/dL   Chol/HDL Ratio 72.5 (H) 0.0 - 5.0 ratio  TSH  Result Value Ref Range   TSH 2.140 0.450 - 4.500 uIU/mL  VITAMIN D 25 Hydroxy (Vit-D Deficiency, Fractures)  Result Value Ref Range   Vit D, 25-Hydroxy 23.2 (L) 30.0 - 100.0 ng/mL  CMP14+EGFR  Result Value Ref Range   Glucose 83 70 - 99 mg/dL   BUN 10 6 -  20 mg/dL   Creatinine, Ser 3.66 0.76 - 1.27 mg/dL   eGFR 440 >34 VQ/QVZ/5.63   BUN/Creatinine Ratio 12 9 - 20   Sodium 138 134 - 144 mmol/L   Potassium 4.1 3.5 - 5.2 mmol/L   Chloride 104 96 - 106 mmol/L   CO2 19 (L) 20 - 29 mmol/L   Calcium 9.5 8.7 - 10.2 mg/dL   Total Protein 7.2 6.0 - 8.5 g/dL   Albumin 4.3 4.1 - 5.1 g/dL   Globulin, Total 2.9 1.5 - 4.5 g/dL   Bilirubin Total 0.4 0.0 - 1.2 mg/dL   Alkaline Phosphatase 105 44 - 121 IU/L   AST 35 0 - 40 IU/L   ALT 73 (H) 0 - 44 IU/L       05/29/2023    2:57 PM 05/03/2023    3:45 PM 08/30/2022    9:57 AM 04/25/2022    3:43 PM 10/25/2020    9:27 AM  Depression screen PHQ 2/9  Decreased Interest 0 0 0 3 0  Down, Depressed, Hopeless 0 0 0 3 0  PHQ - 2 Score 0 0 0 6 0  Altered sleeping 0 0 3 3   Tired, decreased energy 0 0 1 0   Change in appetite 0 0 0  3   Feeling bad or failure about yourself  0 0 0 3   Trouble concentrating 0 0 0 0   Moving slowly or fidgety/restless 0 0 0 2   Suicidal thoughts 0 0 0 0   PHQ-9 Score 0 0 4 17   Difficult doing work/chores Not difficult at all  Not difficult at all Extremely dIfficult        05/29/2023    2:57 PM 05/03/2023    3:45 PM 08/30/2022    9:58 AM 04/25/2022    3:44 PM  GAD 7 : Generalized Anxiety Score  Nervous, Anxious, on Edge 0 0 0 3  Control/stop worrying 0 0 0 3  Worry too much - different things 0 0 0 3  Trouble relaxing 0 0 0 2  Restless 0 0 0 3  Easily annoyed or irritable 0 0 0 3  Afraid - awful might happen 0 0 0 3  Total GAD 7 Score 0 0 0 20  Anxiety Difficulty Not difficult at all  Not difficult at all     Pertinent labs & imaging results that were available during my care of the patient were reviewed by me and considered in my medical decision making.  Assessment & Plan:  Saurabh was seen today for emesis.  Diagnoses and all orders for this visit:  Vomiting without nausea, unspecified vomiting type Will start PPI trial as below. Labs as below. Will  communicate results to patient once available. Will await results to determine next steps.  Patient is established with Dr. Myrtie Neither, GI. Patient to reach out to follow up.  Reviewed note form Zehr, PA-C on 12/29/22. Patient was evaluated for IBS-D with unremarkable workup. Patient was taking Levsin at that time. Is no longer taking Levsin.  -     omeprazole (PRILOSEC) 40 MG capsule; Take 1 capsule (40 mg total) by mouth daily. -     Amylase -     Lipase -     CMP14+EGFR -     CBC with Differential/Platelet  Continue all other maintenance medications.  Follow up plan: Return if symptoms worsen or fail to improve.  Continue healthy lifestyle choices, including diet (rich in fruits, vegetables, and lean proteins, and low in salt and simple carbohydrates) and exercise (at least 30 minutes of moderate physical activity daily).  Written and verbal instructions provided   The above assessment and management plan was discussed with the patient. The patient verbalized understanding of and has agreed to the management plan. Patient is aware to call the clinic if they develop any new symptoms or if symptoms persist or worsen. Patient is aware when to return to the clinic for a follow-up visit. Patient educated on when it is appropriate to go to the emergency department.   Neale Burly, DNP-FNP [facility] [address] [place], [place] [address] [phone]

## 2023-05-30 ENCOUNTER — Other Ambulatory Visit: Payer: Self-pay | Admitting: *Deleted

## 2023-05-30 LAB — CBC WITH DIFFERENTIAL/PLATELET
Basophils Absolute: 0.1 10*3/uL (ref 0.0–0.2)
Basos: 1 %
EOS (ABSOLUTE): 0.2 10*3/uL (ref 0.0–0.4)
Eos: 3 %
Hematocrit: 48.4 % (ref 37.5–51.0)
Hemoglobin: 15.8 g/dL (ref 13.0–17.7)
Immature Grans (Abs): 0 10*3/uL (ref 0.0–0.1)
Immature Granulocytes: 0 %
Lymphocytes Absolute: 2.1 10*3/uL (ref 0.7–3.1)
Lymphs: 31 %
MCH: 29.4 pg (ref 26.6–33.0)
MCHC: 32.6 g/dL (ref 31.5–35.7)
MCV: 90 fL (ref 79–97)
Monocytes Absolute: 0.7 10*3/uL (ref 0.1–0.9)
Monocytes: 11 %
Neutrophils Absolute: 3.7 10*3/uL (ref 1.4–7.0)
Neutrophils: 54 %
Platelets: 226 10*3/uL (ref 150–450)
RBC: 5.37 x10E6/uL (ref 4.14–5.80)
RDW: 13.3 % (ref 11.6–15.4)
WBC: 6.8 10*3/uL (ref 3.4–10.8)

## 2023-05-30 LAB — CMP14+EGFR
ALT: 46 IU/L — ABNORMAL HIGH (ref 0–44)
AST: 29 IU/L (ref 0–40)
Albumin: 4.5 g/dL (ref 4.1–5.1)
Alkaline Phosphatase: 97 IU/L (ref 44–121)
BUN/Creatinine Ratio: 12 (ref 9–20)
BUN: 13 mg/dL (ref 6–20)
Bilirubin Total: 0.3 mg/dL (ref 0.0–1.2)
CO2: 20 mmol/L (ref 20–29)
Calcium: 9.5 mg/dL (ref 8.7–10.2)
Chloride: 103 mmol/L (ref 96–106)
Creatinine, Ser: 1.06 mg/dL (ref 0.76–1.27)
Globulin, Total: 2.7 g/dL (ref 1.5–4.5)
Glucose: 103 mg/dL — ABNORMAL HIGH (ref 70–99)
Potassium: 4.2 mmol/L (ref 3.5–5.2)
Sodium: 138 mmol/L (ref 134–144)
Total Protein: 7.2 g/dL (ref 6.0–8.5)
eGFR: 96 mL/min/{1.73_m2} (ref >59)

## 2023-05-30 LAB — AMYLASE: Amylase: 72 U/L (ref 31–110)

## 2023-05-30 LAB — LIPASE: Lipase: 40 U/L (ref 13–78)

## 2023-05-30 NOTE — Progress Notes (Signed)
Can we add on A1C for BG? ALT slightly elevated, some variation in labs is expected. All other labs normal.

## 2023-05-31 LAB — HGB A1C W/O EAG: Hgb A1c MFr Bld: 5.2 % (ref 4.8–5.6)

## 2023-05-31 LAB — SPECIMEN STATUS REPORT

## 2023-06-01 NOTE — Progress Notes (Signed)
A1C looks great 

## 2023-09-19 DIAGNOSIS — L739 Follicular disorder, unspecified: Secondary | ICD-10-CM | POA: Diagnosis not present

## 2023-10-30 ENCOUNTER — Ambulatory Visit (INDEPENDENT_AMBULATORY_CARE_PROVIDER_SITE_OTHER): Payer: 59 | Admitting: Family Medicine

## 2023-10-30 ENCOUNTER — Encounter: Payer: Self-pay | Admitting: Family Medicine

## 2023-10-30 VITALS — BP 122/79 | HR 100 | Temp 98.9°F | Ht 73.0 in | Wt 233.0 lb

## 2023-10-30 DIAGNOSIS — R0683 Snoring: Secondary | ICD-10-CM | POA: Diagnosis not present

## 2023-10-30 DIAGNOSIS — K219 Gastro-esophageal reflux disease without esophagitis: Secondary | ICD-10-CM | POA: Diagnosis not present

## 2023-10-30 DIAGNOSIS — J069 Acute upper respiratory infection, unspecified: Secondary | ICD-10-CM

## 2023-10-30 DIAGNOSIS — J351 Hypertrophy of tonsils: Secondary | ICD-10-CM | POA: Diagnosis not present

## 2023-10-30 DIAGNOSIS — R6889 Other general symptoms and signs: Secondary | ICD-10-CM | POA: Diagnosis not present

## 2023-10-30 MED ORDER — FAMOTIDINE 20 MG PO TABS
20.0000 mg | ORAL_TABLET | Freq: Two times a day (BID) | ORAL | 0 refills | Status: DC
Start: 2023-10-30 — End: 2023-12-25

## 2023-10-30 NOTE — Progress Notes (Signed)
 Subjective:  Patient ID: Marcus Martinez, male    DOB: 06-27-1991, 33 y.o.   MRN: [id number]  Patient Care Team: Arrie Senate, FNP as PCP - General (Family Medicine)   Chief Complaint:  URI  HPI: Marcus Martinez is a 33 y.o. male presenting on 10/30/2023 for URI States that symptoms started a few days ago. Reports cough, congestion, rhinorrhea. Did not swab at home for covid. Taking mucinex, dayquil, nyquil. Denies fever.   States that the last 3 months he is waking up in the middle of the night gasping for breath. Has not completed sleep study. Previously discussed going to ENT for enlarged tonsils and patient is ready to do that at this time. Reports that he is tired throughout the day.   States he has recurring nausea and vomiting one month ago. Taking prilosec. Endorses burping. States that sometimes it is hard to swallow. Denies any choking. Denies hematochezia, hematemesis, melena.   Relevant past medical, surgical, family, and social history reviewed and updated as indicated.  Allergies and medications reviewed and updated. Data reviewed: Chart in Epic.   Past Medical History:  Diagnosis Date   ADHD (attention deficit hyperactivity disorder)    GAD (generalized anxiety disorder)    Major depressive disorder     Past Surgical History:  Procedure Laterality Date   WISDOM TOOTH EXTRACTION      Social History   Socioeconomic History   Marital status: Single    Spouse name: Not on file   Number of children: Not on file   Years of education: Not on file   Highest education level: Not on file  Occupational History   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  Tobacco Use   Smoking status: Every Day    Current packs/day: 0.25    Types: Cigarettes   Smokeless tobacco: Never  Vaping Use   Vaping status: Never Used  Substance and Sexual Activity   Alcohol use: Yes    Comment: occasional   Drug use: Not Currently    Types: Marijuana   Sexual activity: Never   Other Topics Concern   Not on file  Social History Narrative   Not on file   Social Drivers of Health   Financial Resource Strain: Low Risk  (11/14/2017)   Overall Financial Resource Strain (CARDIA)    Difficulty of Paying Living Expenses: Not hard at all  Food Insecurity: No Food Insecurity (11/14/2017)   Hunger Vital Sign    Worried About Running Out of Food in the Last Year: Never true    Ran Out of Food in the Last Year: Never true  Transportation Needs: No Transportation Needs (11/14/2017)   PRAPARE - Administrator, Civil Service (Medical): No    Lack of Transportation (Non-Medical): No  Physical Activity: Inactive (11/14/2017)   Exercise Vital Sign    Days of Exercise per Week: 0 days    Minutes of Exercise per Session: 0 min  Stress: No Stress Concern Present (11/14/2017)   [employer] of Occupational Health - Occupational Stress Questionnaire    Feeling of Stress : Not at all  Social Connections: Somewhat Isolated (11/14/2017)   Social Connection and Isolation Panel [NHANES]    Frequency of Communication with Friends and Family: More than three times a week    Frequency of Social Gatherings with Friends and Family: Once a week    Attends Religious Services: More than 4 times per year    Active Member of [employer] or Organizations:  No    Attends Club or Organization Meetings: Never    Marital Status: Never married  Intimate Partner Violence: Not At Risk (11/14/2017)   Humiliation, Afraid, Rape, and Kick questionnaire    Fear of Current or Ex-Partner: No    Emotionally Abused: No    Physically Abused: No    Sexually Abused: No    Outpatient Encounter Medications as of 10/30/2023  Medication Sig   atomoxetine (STRATTERA) 40 MG capsule Take by mouth.   omeprazole (PRILOSEC) 40 MG capsule Take 1 capsule (40 mg total) by mouth daily.   VRAYLAR 1.5 MG capsule Take 1.5 mg by mouth daily.   [DISCONTINUED] buPROPion (WELLBUTRIN SR) 150 MG 12 hr tablet Take 1 tablet  (150 mg total) by mouth 2 (two) times daily.   No facility-administered encounter medications on file as of 10/30/2023.    Allergies  Allergen Reactions   Citalopram Diarrhea and Other (See Comments)    "Suicidal Thoughts"   Penicillins Rash   Pollen Extract Cough and Other (See Comments)   Review of Systems As per HPI Objective:  BP 122/79   Pulse 100   Temp 98.9 F (37.2 C)   Ht 6\' 1"  (1.854 m)   Wt 233 lb (105.7 kg)   SpO2 95%   BMI 30.74 kg/m    Wt Readings from Last 3 Encounters:  05/29/23 231 lb (104.8 kg)  05/11/23 230 lb 6.4 oz (104.5 kg)  05/03/23 229 lb (103.9 kg)    Physical Exam Constitutional:      General: He is awake. He is not in acute distress.    Appearance: Normal appearance. He is well-developed and well-groomed. He is not ill-appearing, toxic-appearing or diaphoretic.  HENT:     Right Ear: No drainage, swelling or tenderness. A middle ear effusion is present. There is no impacted cerumen. No foreign body. No mastoid tenderness. No PE tube. No hemotympanum. Tympanic membrane is injected. Tympanic membrane is not scarred, perforated, erythematous, retracted or bulging.     Left Ear: No drainage, swelling or tenderness. A middle ear effusion is present. There is no impacted cerumen. No foreign body. No mastoid tenderness. No PE tube. No hemotympanum. Tympanic membrane is injected. Tympanic membrane is not scarred, perforated, erythematous, retracted or bulging.     Nose: Congestion and rhinorrhea present. Rhinorrhea is clear.     Right Nostril: No foreign body.     Left Nostril: No foreign body.     Right Turbinates: Not enlarged.     Left Turbinates: Not enlarged.     Right Sinus: Maxillary sinus tenderness and frontal sinus tenderness present.     Left Sinus: Maxillary sinus tenderness and frontal sinus tenderness present.     Mouth/Throat:     Lips: Pink. No lesions.     Tongue: No lesions.     Palate: No mass.     Pharynx: Posterior oropharyngeal  erythema present.     Tonsils: No tonsillar exudate or tonsillar abscesses. 4+ on the right. 4+ on the left.  Cardiovascular:     Rate and Rhythm: Normal rate and regular rhythm.     Pulses: Normal pulses.          Radial pulses are 2+ on the right side and 2+ on the left side.       Posterior tibial pulses are 2+ on the right side and 2+ on the left side.     Heart sounds: Normal heart sounds. No murmur heard.    No gallop.  Pulmonary:     Effort: Pulmonary effort is normal. No respiratory distress.     Breath sounds: Normal breath sounds. No stridor. No wheezing, rhonchi or rales.  Musculoskeletal:     Cervical back: Full passive range of motion without pain and neck supple.     Right lower leg: No edema.     Left lower leg: No edema.  Lymphadenopathy:     Head:     Right side of head: Tonsillar adenopathy present. No submental, submandibular, preauricular or posterior auricular adenopathy.     Left side of head: Tonsillar adenopathy present. No submental, submandibular, preauricular or posterior auricular adenopathy.     Cervical:     Right cervical: No superficial cervical adenopathy.    Left cervical: No superficial cervical adenopathy.  Skin:    General: Skin is warm.     Capillary Refill: Capillary refill takes less than 2 seconds.  Neurological:     General: No focal deficit present.     Mental Status: He is alert, oriented to person, place, and time and easily aroused. Mental status is at baseline.     GCS: GCS eye subscore is 4. GCS verbal subscore is 5. GCS motor subscore is 6.     Motor: No weakness.  Psychiatric:        Attention and Perception: Attention and perception normal.        Mood and Affect: Mood and affect normal.        Speech: Speech normal.        Behavior: Behavior normal. Behavior is cooperative.        Thought Content: Thought content normal. Thought content does not include homicidal or suicidal ideation. Thought content does not include homicidal or  suicidal plan.        Cognition and Memory: Cognition and memory normal.        Judgment: Judgment normal.    Results for orders placed or performed in visit on 05/29/23  Amylase   Collection Time: 05/29/23  3:26 PM  Result Value Ref Range   Amylase 72 31 - 110 U/L  Lipase   Collection Time: 05/29/23  3:26 PM  Result Value Ref Range   Lipase 40 13 - 78 U/L  CMP14+EGFR   Collection Time: 05/29/23  3:26 PM  Result Value Ref Range   Glucose 103 (H) 70 - 99 mg/dL   BUN 13 6 - 20 mg/dL   Creatinine, Ser 6.96 0.76 - 1.27 mg/dL   eGFR 96 >29 BM/WUX/3.24   BUN/Creatinine Ratio 12 9 - 20   Sodium 138 134 - 144 mmol/L   Potassium 4.2 3.5 - 5.2 mmol/L   Chloride 103 96 - 106 mmol/L   CO2 20 20 - 29 mmol/L   Calcium 9.5 8.7 - 10.2 mg/dL   Total Protein 7.2 6.0 - 8.5 g/dL   Albumin 4.5 4.1 - 5.1 g/dL   Globulin, Total 2.7 1.5 - 4.5 g/dL   Bilirubin Total 0.3 0.0 - 1.2 mg/dL   Alkaline Phosphatase 97 44 - 121 IU/L   AST 29 0 - 40 IU/L   ALT 46 (H) 0 - 44 IU/L  CBC with Differential/Platelet   Collection Time: 05/29/23  3:26 PM  Result Value Ref Range   WBC 6.8 3.4 - 10.8 x10E3/uL   RBC 5.37 4.14 - 5.80 x10E6/uL   Hemoglobin 15.8 13.0 - 17.7 g/dL   Hematocrit 40.1 02.7 - 51.0 %   MCV 90 79 - 97 fL   MCH  29.4 26.6 - 33.0 pg   MCHC 32.6 31.5 - 35.7 g/dL   RDW 78.4 69.6 - 29.5 %   Platelets 226 150 - 450 x10E3/uL   Neutrophils 54 Not Estab. %   Lymphs 31 Not Estab. %   Monocytes 11 Not Estab. %   Eos 3 Not Estab. %   Basos 1 Not Estab. %   Neutrophils Absolute 3.7 1.4 - 7.0 x10E3/uL   Lymphocytes Absolute 2.1 0.7 - 3.1 x10E3/uL   Monocytes Absolute 0.7 0.1 - 0.9 x10E3/uL   EOS (ABSOLUTE) 0.2 0.0 - 0.4 x10E3/uL   Basophils Absolute 0.1 0.0 - 0.2 x10E3/uL   Immature Granulocytes 0 Not Estab. %   Immature Grans (Abs) 0.0 0.0 - 0.1 x10E3/uL  Hgb A1c w/o eAG   Collection Time: 05/29/23  3:26 PM  Result Value Ref Range   Hgb A1c MFr Bld 5.2 4.8 - 5.6 %  Specimen status report    Collection Time: 05/29/23  3:26 PM  Result Value Ref Range   specimen status report Comment        05/29/2023    2:57 PM 05/03/2023    3:45 PM 08/30/2022    9:57 AM 04/25/2022    3:43 PM 10/25/2020    9:27 AM  Depression screen PHQ 2/9  Decreased Interest 0 0 0 3 0  Down, Depressed, Hopeless 0 0 0 3 0  PHQ - 2 Score 0 0 0 6 0  Altered sleeping 0 0 3 3   Tired, decreased energy 0 0 1 0   Change in appetite 0 0 0 3   Feeling bad or failure about yourself  0 0 0 3   Trouble concentrating 0 0 0 0   Moving slowly or fidgety/restless 0 0 0 2   Suicidal thoughts 0 0 0 0   PHQ-9 Score 0 0 4 17   Difficult doing work/chores Not difficult at all  Not difficult at all Extremely dIfficult        05/29/2023    2:57 PM 05/03/2023    3:45 PM 08/30/2022    9:58 AM 04/25/2022    3:44 PM  GAD 7 : Generalized Anxiety Score  Nervous, Anxious, on Edge 0 0 0 3  Control/stop worrying 0 0 0 3  Worry too much - different things 0 0 0 3  Trouble relaxing 0 0 0 2  Restless 0 0 0 3  Easily annoyed or irritable 0 0 0 3  Afraid - awful might happen 0 0 0 3  Total GAD 7 Score 0 0 0 20  Anxiety Difficulty Not difficult at all  Not difficult at all    Pertinent labs & imaging results that were available during my care of the patient were reviewed by me and considered in my medical decision making.  Assessment & Plan:  Marcus Martinez was seen today for uri.  Diagnoses and all orders for this visit: 1. Viral URI (Primary) Positive for RSV. Discussed at home care and precautions. Discussed signs and symptoms to monitor.  - RSV Ag, Immunochr, Waived - Veritor Flu A/B Waived  2. Enlarged tonsils Referral placed as below.  Positive for RSV, as above.  Labs as below. Will communicate results to patient once available. Will await results to determine next steps.  - Ambulatory referral to ENT - Ambulatory referral to Sleep Studies - RSV Ag, Immunochr, Waived - Veritor Flu A/B Waived - Rapid Strep Screen ([facility] ONLY); Future - Culture, Group A  Strep; Future - Rapid Strep Screen ([facility] ONLY) - Culture, Group A Strep  3. Snoring Referral placed to ENT and sleep studies. Given tonsillar hypertrophy, may be beneficial to follow with ENT first to get recommendations.  - Ambulatory referral to ENT - Ambulatory referral to Sleep Studies  4. Gastroesophageal reflux disease, unspecified whether esophagitis present Referral placed as below given red flag symptoms of persistent vomiting and trouble swallowing. Will add in pepcid as below.  - Ambulatory referral to Gastroenterology - famotidine (PEPCID) 20 MG tablet; Take 1 tablet (20 mg total) by mouth 2 (two) times daily.  Dispense: 120 tablet; Refill: 0  Continue all other maintenance medications.  Follow up plan: Return if symptoms worsen or fail to improve.   Continue healthy lifestyle choices, including diet (rich in fruits, vegetables, and lean proteins, and low in salt and simple carbohydrates) and exercise (at least 30 minutes of moderate physical activity daily).  Written and verbal instructions provided   The above assessment and management plan was discussed with the patient. The patient verbalized understanding of and has agreed to the management plan. Patient is aware to call the clinic if they develop any new symptoms or if symptoms persist or worsen. Patient is aware when to return to the clinic for a follow-up visit. Patient educated on when it is appropriate to go to the emergency department.   Neale Burly, DNP-FNP [facility] [address] [place], [place] [address] [phone]

## 2023-10-31 LAB — VERITOR FLU A/B WAIVED
Influenza A: NEGATIVE
Influenza B: NEGATIVE

## 2023-10-31 LAB — RSV AG, IMMUNOCHR, WAIVED: RSV Ag, Immunochr, Waived: POSITIVE — AB

## 2023-10-31 LAB — RAPID STREP SCREEN (MED CTR MEBANE ONLY): Strep Gp A Ag, IA W/Reflex: NEGATIVE

## 2023-10-31 LAB — CULTURE, GROUP A STREP

## 2023-11-01 LAB — CULTURE, GROUP A STREP

## 2023-11-02 ENCOUNTER — Encounter: Payer: Self-pay | Admitting: Family Medicine

## 2023-11-02 MED ORDER — CEFDINIR 300 MG PO CAPS: 300.0000 mg | ORAL_CAPSULE | Freq: Two times a day (BID) | ORAL | 0 refills | Status: DC

## 2023-11-02 NOTE — Addendum Note (Signed)
 Addended by: Neale Burly on: 11/02/2023 09:54 AM   Modules accepted: Orders

## 2023-11-02 NOTE — Progress Notes (Signed)
 Positive for GBS as well. Will send in cefdinir for patient as he has allergy to penicillin, but has had cephalexin in the past.

## 2023-11-12 DIAGNOSIS — L739 Follicular disorder, unspecified: Secondary | ICD-10-CM | POA: Diagnosis not present

## 2023-11-22 DIAGNOSIS — L739 Follicular disorder, unspecified: Secondary | ICD-10-CM | POA: Diagnosis not present

## 2023-12-13 ENCOUNTER — Encounter: Payer: Self-pay | Admitting: Family Medicine

## 2023-12-13 ENCOUNTER — Ambulatory Visit

## 2023-12-13 VITALS — BP 136/95 | HR 99 | Temp 98.0°F | Ht 73.0 in | Wt 231.4 lb

## 2023-12-13 DIAGNOSIS — B029 Zoster without complications: Secondary | ICD-10-CM | POA: Diagnosis not present

## 2023-12-13 MED ORDER — VALACYCLOVIR HCL 1 G PO TABS: 1000.0000 mg | ORAL_TABLET | Freq: Three times a day (TID) | ORAL | 0 refills | Status: AC

## 2023-12-13 MED ORDER — HYDROXYZINE PAMOATE 25 MG PO CAPS: 25.0000 mg | ORAL_CAPSULE | Freq: Three times a day (TID) | ORAL | 0 refills | Status: DC | PRN

## 2023-12-13 NOTE — Progress Notes (Signed)
 Subjective:  Patient ID: Marcus Martinez, male    DOB: April 19, 1991, 33 y.o.   MRN: [id number]  Patient Care Team: Ellamae Sia Aleen Campi, FNP as PCP - General (Family Medicine)   Chief Complaint:  Rash (Left flank x 3 days that itch and burn )   HPI: Marcus Martinez is a 33 y.o. male presenting on 12/13/2023 for Rash (Left flank x 3 days that itch and burn )   Discussed the use of AI scribe software for clinical note transcription with the patient, who gave verbal consent to proceed.  History of Present Illness   Marcus Martinez is a 33 year old male who presents with a rash.  He has a rash that began approximately four days ago, described as itchy and burning, localized to one area of his body, following a nerve pattern. No rash is present elsewhere on his body.  He recalls having chickenpox as a child, which is relevant to the current condition. He has not taken any antiviral medications before.  He is not living with anyone who is immunocompromised, although he mentions having diabetes in the household.  He is currently not taking any medications for this condition.      Relevant past medical, surgical, family, and social history reviewed and updated as indicated.  Allergies and medications reviewed and updated. Data reviewed: Chart in Epic.   Past Medical History:  Diagnosis Date   ADHD (attention deficit hyperactivity disorder)    GAD (generalized anxiety disorder)    Major depressive disorder     Past Surgical History:  Procedure Laterality Date   WISDOM TOOTH EXTRACTION      Social History   Socioeconomic History   Marital status: Single    Spouse name: Not on file   Number of children: Not on file   Years of education: Not on file   Highest education level: Not on file  Occupational History   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  Tobacco Use   Smoking status: Every Day    Current packs/day: 0.25    Types: Cigarettes   Smokeless tobacco: Never   Vaping Use   Vaping status: Never Used  Substance and Sexual Activity   Alcohol use: Yes    Comment: occasional   Drug use: Not Currently    Types: Marijuana   Sexual activity: Never  Other Topics Concern   Not on file  Social History Narrative   Not on file   Social Drivers of Health   Financial Resource Strain: Low Risk  (11/14/2017)   Overall Financial Resource Strain (CARDIA)    Difficulty of Paying Living Expenses: Not hard at all  Food Insecurity: No Food Insecurity (11/14/2017)   Hunger Vital Sign    Worried About Running Out of Food in the Last Year: Never true    Ran Out of Food in the Last Year: Never true  Transportation Needs: No Transportation Needs (11/14/2017)   PRAPARE - Administrator, Civil Service (Medical): No    Lack of Transportation (Non-Medical): No  Physical Activity: Inactive (11/14/2017)   Exercise Vital Sign    Days of Exercise per Week: 0 days    Minutes of Exercise per Session: 0 min  Stress: No Stress Concern Present (11/14/2017)   [employer] of Occupational Health - Occupational Stress Questionnaire    Feeling of Stress : Not at all  Social Connections: Somewhat Isolated (11/14/2017)   Social Connection and Isolation Panel [NHANES]    Frequency of Communication  with Friends and Family: More than three times a week    Frequency of Social Gatherings with Friends and Family: Once a week    Attends Religious Services: More than 4 times per year    Active Member of [employer] or Organizations: No    Attends Banker Meetings: Never    Marital Status: Never married  Intimate Partner Violence: Not At Risk (11/14/2017)   Humiliation, Afraid, Rape, and Kick questionnaire    Fear of Current or Ex-Partner: No    Emotionally Abused: No    Physically Abused: No    Sexually Abused: No    Outpatient Encounter Medications as of 12/13/2023  Medication Sig   atomoxetine (STRATTERA) 40 MG capsule Take by mouth.   famotidine (PEPCID)  20 MG tablet Take 1 tablet (20 mg total) by mouth 2 (two) times daily.   hydrOXYzine (VISTARIL) 25 MG capsule Take 1 capsule (25 mg total) by mouth every 8 (eight) hours as needed for itching.   omeprazole (PRILOSEC) 40 MG capsule Take 1 capsule (40 mg total) by mouth daily.   permethrin (ELIMITE) 5 % cream Apply topically.   valACYclovir (VALTREX) 1000 MG tablet Take 1 tablet (1,000 mg total) by mouth 3 (three) times daily for 10 days.   VRAYLAR 1.5 MG capsule Take 1.5 mg by mouth daily.   [DISCONTINUED] buPROPion (WELLBUTRIN SR) 150 MG 12 hr tablet Take 1 tablet (150 mg total) by mouth 2 (two) times daily.   [DISCONTINUED] cefdinir (OMNICEF) 300 MG capsule Take 1 capsule (300 mg total) by mouth 2 (two) times daily. 1 po BID   No facility-administered encounter medications on file as of 12/13/2023.    Allergies  Allergen Reactions   Citalopram Diarrhea and Other (See Comments)    "Suicidal Thoughts"   Penicillins Rash   Pollen Extract Cough and Other (See Comments)    Pertinent ROS per HPI, otherwise unremarkable      Objective:  BP (!) 136/95   Pulse 99   Temp 98 F (36.7 C)   Ht 6\' 1"  (1.854 m)   Wt 231 lb 6.4 oz (105 kg)   SpO2 93%   BMI 30.53 kg/m    Wt Readings from Last 3 Encounters:  12/13/23 231 lb 6.4 oz (105 kg)  10/30/23 233 lb (105.7 kg)  05/29/23 231 lb (104.8 kg)    Physical Exam Vitals and nursing note reviewed.  Constitutional:      General: He is not in acute distress.    Appearance: Normal appearance. He is obese. He is not ill-appearing, toxic-appearing or diaphoretic.  HENT:     Head: Normocephalic and atraumatic.     Nose: Nose normal.     Mouth/Throat:     Mouth: Mucous membranes are moist.  Eyes:     Conjunctiva/sclera: Conjunctivae normal.  Cardiovascular:     Rate and Rhythm: Normal rate and regular rhythm.     Heart sounds: Normal heart sounds.  Pulmonary:     Effort: Pulmonary effort is normal.     Breath sounds: Normal breath  sounds.  Musculoskeletal:     Cervical back: Neck supple.     Right lower leg: No edema.     Left lower leg: No edema.  Skin:    General: Skin is warm and dry.     Capillary Refill: Capillary refill takes less than 2 seconds.     Findings: Erythema and rash present. Rash is vesicular.       Neurological:  General: No focal deficit present.     Mental Status: He is alert and oriented to person, place, and time.  Psychiatric:        Attention and Perception: Attention and perception normal.        Mood and Affect: Mood is anxious.        Behavior: Behavior normal.        Thought Content: Thought content normal.        Judgment: Judgment normal.      Results for orders placed or performed in visit on 10/30/23  RSV Ag, Immunochr, Waived   Collection Time: 10/30/23  4:34 PM   Specimen: Nasopharyngeal   Naso  Result Value Ref Range   RSV Ag, Immunochr, Waived Positive (A) Negative  Rapid Strep Screen ([facility] ONLY)   Collection Time: 10/30/23  4:34 PM   Specimen: Other   Other  Result Value Ref Range   Strep Gp A Ag, IA W/Reflex Negative Negative  Culture, Group A Strep   Collection Time: 10/30/23  4:34 PM   Other  Result Value Ref Range   Strep A Culture CANCELED   Veritor Flu A/B Waived   Collection Time: 10/30/23  4:34 PM  Result Value Ref Range   Influenza A Negative Negative   Influenza B Negative Negative  Culture, Group A Strep   Collection Time: 10/30/23  5:27 PM   Specimen: Throat   TH  Result Value Ref Range   Strep A Culture Comment (A)        Pertinent labs & imaging results that were available during my care of the patient were reviewed by me and considered in my medical decision making.  Assessment & Plan:  Nadim was seen today for rash.  Diagnoses and all orders for this visit:  Herpes zoster without complication -     valACYclovir (VALTREX) 1000 MG tablet; Take 1 tablet (1,000 mg total) by mouth 3 (three) times daily for 10  days. -     hydrOXYzine (VISTARIL) 25 MG capsule; Take 1 capsule (25 mg total) by mouth every 8 (eight) hours as needed for itching.     Assessment and Plan    Herpes Zoster (Shingles) Yussuf presents with an acute, weepy rash characterized by itching and burning, consistent with herpes zoster following a specific nerve dermatome. He has a history of chickenpox, a prerequisite for shingles. The rash is contagious while open and draining. No household members are immunocompromised, reducing transmission risk. Educated on the contagious nature, importance of covering the rash with a shirt, maintaining hand hygiene, and avoiding scratching to prevent spreading. Explained that shingles follows a nerve pattern and typically does not cross the midline. - Prescribe valacyclovir 1000 mg orally three times a day for 10 days. - Recommend over-the-counter Benadryl cream for itching. - Prescribe Atarax for itching, to be taken every eight hours as needed, with caution for drowsiness. - Advise to keep the rash covered with a shirt and maintain good hand hygiene. - Advise against scratching or picking at the rash to prevent spreading. - Advise to stay out of work until Monday, December 17, 2023, when the rash should be drying up with treatment. - Provide a work note for absence until December 17, 2023.          Continue all other maintenance medications.  Follow up plan: Return if symptoms worsen or fail to improve.   Continue healthy lifestyle choices, including diet (rich in fruits, vegetables, and lean proteins,  and low in salt and simple carbohydrates) and exercise (at least 30 minutes of moderate physical activity daily).  Educational handout given for shingles   The above assessment and management plan was discussed with the patient. The patient verbalized understanding of and has agreed to the management plan. Patient is aware to call the clinic if they develop any new symptoms or if symptoms  persist or worsen. Patient is aware when to return to the clinic for a follow-up visit. Patient educated on when it is appropriate to go to the emergency department.   Kari Baars, FNP-C [facility] [phone]

## 2023-12-21 ENCOUNTER — Other Ambulatory Visit: Payer: Self-pay | Admitting: Family Medicine

## 2023-12-21 DIAGNOSIS — K219 Gastro-esophageal reflux disease without esophagitis: Secondary | ICD-10-CM

## 2024-01-01 ENCOUNTER — Encounter: Payer: Self-pay | Admitting: *Deleted

## 2024-03-17 ENCOUNTER — Ambulatory Visit: Payer: Self-pay

## 2024-03-17 NOTE — Telephone Encounter (Signed)
  FYI Only or Action Required?: FYI only for provider.  Patient was last seen in primary care on 12/13/2023 by Marcus Rock HERO, FNP.  Called Nurse Triage reporting Hypertension.  Symptoms began today.  Interventions attempted: Nothing.  Symptoms are: unchanged.  Triage Disposition: See PCP When Office is Open (Within 3 Days)  Patient/caregiver understands and will follow disposition?: Yes  Copied from CRM [id number]. Topic: Clinical - Red Word Triage >> Mar 17, 2024  3:36 PM Rosaria BRAVO wrote: Red Word that prompted transfer to Nurse Triage: BP 152/107, concerned. Was turned away from donating blood this morning. Reason for Disposition  Systolic BP >= 160 OR Diastolic >= 100  Answer Assessment - Initial Assessment Questions 1. BLOOD PRESSURE: What is your blood pressure? Did you take at least two measurements 5 minutes apart?     152/107 blood bank, he was there to give blood 2. ONSET: When did you take your blood pressure?     today 3. HOW: How did you take your blood pressure? (e.g., automatic home BP monitor, visiting nurse)     nurse 4. HISTORY: Do you have a history of high blood pressure?     htn 5. MEDICINES: Are you taking any medicines for blood pressure? Have you missed any doses recently?     no 6. OTHER SYMPTOMS: Do you have any symptoms? (e.g., blurred vision, chest pain, difficulty breathing, headache, weakness)     no 7. PREGNANCY: Is there any chance you are pregnant? When was your last menstrual period?     na  Protocols used: Blood Pressure - High-A-AH

## 2024-03-17 NOTE — Telephone Encounter (Addendum)
 E2C2 scheduled appointment.

## 2024-03-18 ENCOUNTER — Ambulatory Visit: Admitting: Nurse Practitioner

## 2024-03-18 ENCOUNTER — Encounter: Payer: Self-pay | Admitting: Nurse Practitioner

## 2024-03-18 VITALS — BP 138/98 | HR 88 | Temp 98.2°F | Ht 73.0 in | Wt 238.0 lb

## 2024-03-18 DIAGNOSIS — I10 Essential (primary) hypertension: Secondary | ICD-10-CM

## 2024-03-18 MED ORDER — HYDROCHLOROTHIAZIDE 12.5 MG PO CAPS: 12.5000 mg | ORAL_CAPSULE | Freq: Every day | ORAL | 1 refills | Status: DC

## 2024-03-18 NOTE — Patient Instructions (Signed)

## 2024-03-18 NOTE — Progress Notes (Signed)
   Subjective:    Patient ID: Marcus Martinez, male    DOB: 10-18-1990, 33 y.o.   MRN: [id number]   Chief Complaint: hypertension   Hypertension Pertinent negatives include no chest pain, headaches, palpitations or shortness of breath.    Patient went to donate blood yesterday and his blood pressure was high. Blood pressure yesterday was 152/107. He denies chest pain, headache or SOB.  Patient Active Problem List   Diagnosis Date Noted   HTN (hypertension) 05/24/2022   Schizoaffective disorder, bipolar type (HCC) 05/18/2022   OCD (obsessive compulsive disorder) 05/18/2022   Unspecified mood (affective) disorder (HCC) 05/17/2022   Depression, recurrent (HCC) 04/25/2022   Encounter for annual physical exam 04/25/2022   Anxiety 04/25/2022   Irritable bowel syndrome with diarrhea 12/28/2020   Diarrhea 10/25/2020   Tonsillar hypertrophy 11/15/2015       Review of Systems  Respiratory:  Negative for shortness of breath.   Cardiovascular:  Negative for chest pain, palpitations and leg swelling.  Neurological:  Negative for dizziness and headaches.       Objective:   Physical Exam Constitutional:      Appearance: Normal appearance.  Cardiovascular:     Rate and Rhythm: Normal rate and regular rhythm.     Heart sounds: Normal heart sounds.  Pulmonary:     Effort: Pulmonary effort is normal.     Breath sounds: Normal breath sounds.  Skin:    General: Skin is warm.  Neurological:     General: No focal deficit present.     Mental Status: He is alert and oriented to person, place, and time.  Psychiatric:        Mood and Affect: Mood normal.        Behavior: Behavior normal.    BP (!) 138/98   Pulse 88   Temp 98.2 F (36.8 C) (Temporal)   Ht 6' 1 (1.854 m)   Wt 238 lb (108 kg)   SpO2 94%   BMI 31.40 kg/m         Assessment & Plan:   Marcus Martinez in today with chief complaint of Hypertension   1. Primary hypertension (Primary) Dash diet Keep diary of  blood pressure at home - CBC with Differential/Platelet - CMP14+EGFR - Lipid panel - hydrochlorothiazide  (MICROZIDE ) 12.5 MG capsule; Take 1 capsule (12.5 mg total) by mouth daily.  Dispense: 90 capsule; Refill: 1    The above assessment and management plan was discussed with the patient. The patient verbalized understanding of and has agreed to the management plan. Patient is aware to call the clinic if symptoms persist or worsen. Patient is aware when to return to the clinic for a follow-up visit. Patient educated on when it is appropriate to go to the emergency department.   [name], FNP

## 2024-03-19 LAB — CMP14+EGFR
ALT: 22 IU/L (ref 0–44)
AST: 18 IU/L (ref 0–40)
Albumin: 4.2 g/dL (ref 4.1–5.1)
Alkaline Phosphatase: 89 IU/L (ref 44–121)
BUN/Creatinine Ratio: 11 (ref 9–20)
BUN: 10 mg/dL (ref 6–20)
Bilirubin Total: 0.3 mg/dL (ref 0.0–1.2)
CO2: 18 mmol/L — ABNORMAL LOW (ref 20–29)
Calcium: 9.7 mg/dL (ref 8.7–10.2)
Chloride: 102 mmol/L (ref 96–106)
Creatinine, Ser: 0.9 mg/dL (ref 0.76–1.27)
Globulin, Total: 2.6 g/dL (ref 1.5–4.5)
Glucose: 88 mg/dL (ref 70–99)
Potassium: 4.3 mmol/L (ref 3.5–5.2)
Sodium: 139 mmol/L (ref 134–144)
Total Protein: 6.8 g/dL (ref 6.0–8.5)
eGFR: 116 mL/min/1.73 (ref >59)

## 2024-03-19 LAB — CBC WITH DIFFERENTIAL/PLATELET
Basophils Absolute: 0.1 x10E3/uL (ref 0.0–0.2)
Basos: 1 %
EOS (ABSOLUTE): 0.2 x10E3/uL (ref 0.0–0.4)
Eos: 3 %
Hematocrit: 48.1 % (ref 37.5–51.0)
Hemoglobin: 16 g/dL (ref 13.0–17.7)
Immature Grans (Abs): 0 x10E3/uL (ref 0.0–0.1)
Immature Granulocytes: 0 %
Lymphocytes Absolute: 2.3 x10E3/uL (ref 0.7–3.1)
Lymphs: 26 %
MCH: 29.6 pg (ref 26.6–33.0)
MCHC: 33.3 g/dL (ref 31.5–35.7)
MCV: 89 fL (ref 79–97)
Monocytes Absolute: 0.9 x10E3/uL (ref 0.1–0.9)
Monocytes: 10 %
Neutrophils Absolute: 5.5 x10E3/uL (ref 1.4–7.0)
Neutrophils: 60 %
Platelets: 291 x10E3/uL (ref 150–450)
RBC: 5.4 x10E6/uL (ref 4.14–5.80)
RDW: 13 % (ref 11.6–15.4)
WBC: 9 x10E3/uL (ref 3.4–10.8)

## 2024-03-19 LAB — LIPID PANEL
Chol/HDL Ratio: 8.3 ratio — ABNORMAL HIGH (ref 0.0–5.0)
Cholesterol, Total: 215 mg/dL — ABNORMAL HIGH (ref 100–199)
HDL: 26 mg/dL — ABNORMAL LOW (ref >39)
LDL Chol Calc (NIH): 122 mg/dL — ABNORMAL HIGH (ref 0–99)
Triglycerides: 379 mg/dL — ABNORMAL HIGH (ref 0–149)
VLDL Cholesterol Cal: 67 mg/dL — ABNORMAL HIGH (ref 5–40)

## 2024-03-20 ENCOUNTER — Ambulatory Visit: Payer: Self-pay | Admitting: Nurse Practitioner

## 2024-04-10 DIAGNOSIS — B9689 Other specified bacterial agents as the cause of diseases classified elsewhere: Secondary | ICD-10-CM | POA: Diagnosis not present

## 2024-04-10 DIAGNOSIS — L02229 Furuncle of trunk, unspecified: Secondary | ICD-10-CM | POA: Diagnosis not present

## 2024-04-29 DIAGNOSIS — R0683 Snoring: Secondary | ICD-10-CM | POA: Diagnosis not present

## 2024-04-29 DIAGNOSIS — R0600 Dyspnea, unspecified: Secondary | ICD-10-CM | POA: Diagnosis not present

## 2024-04-29 DIAGNOSIS — J351 Hypertrophy of tonsils: Secondary | ICD-10-CM | POA: Diagnosis not present

## 2024-04-29 DIAGNOSIS — R519 Headache, unspecified: Secondary | ICD-10-CM | POA: Diagnosis not present

## 2024-05-06 ENCOUNTER — Encounter: Admitting: Nurse Practitioner

## 2024-05-06 ENCOUNTER — Encounter: Payer: 59 | Admitting: Family Medicine

## 2024-05-09 ENCOUNTER — Encounter: Payer: Self-pay | Admitting: Nurse Practitioner

## 2024-05-09 ENCOUNTER — Ambulatory Visit (INDEPENDENT_AMBULATORY_CARE_PROVIDER_SITE_OTHER): Admitting: Nurse Practitioner

## 2024-05-09 VITALS — BP 136/89 | HR 90 | Temp 97.5°F | Ht 73.0 in | Wt 236.0 lb

## 2024-05-09 DIAGNOSIS — F429 Obsessive-compulsive disorder, unspecified: Secondary | ICD-10-CM

## 2024-05-09 DIAGNOSIS — Z0001 Encounter for general adult medical examination with abnormal findings: Secondary | ICD-10-CM

## 2024-05-09 DIAGNOSIS — I10 Essential (primary) hypertension: Secondary | ICD-10-CM | POA: Diagnosis not present

## 2024-05-09 DIAGNOSIS — F339 Major depressive disorder, recurrent, unspecified: Secondary | ICD-10-CM | POA: Diagnosis not present

## 2024-05-09 DIAGNOSIS — K580 Irritable bowel syndrome with diarrhea: Secondary | ICD-10-CM

## 2024-05-09 DIAGNOSIS — Z0000 Encounter for general adult medical examination without abnormal findings: Secondary | ICD-10-CM

## 2024-05-09 DIAGNOSIS — F419 Anxiety disorder, unspecified: Secondary | ICD-10-CM

## 2024-05-09 DIAGNOSIS — Z1329 Encounter for screening for other suspected endocrine disorder: Secondary | ICD-10-CM | POA: Diagnosis not present

## 2024-05-09 DIAGNOSIS — F250 Schizoaffective disorder, bipolar type: Secondary | ICD-10-CM

## 2024-05-09 NOTE — Progress Notes (Signed)
 Subjective:    Patient ID: Marcus Martinez, male    DOB: Jan 07, 1991, 33 y.o.   MRN: [id number]   Chief Complaint: No chief complaint on file.    HPI:  Marcus Martinez is a 33 y.o. who identifies as a male who was assigned male at birth.   Comes in today for follow up of the following chronic medical issues:  1. Annual physical exam   2. Primary hypertension Patient donated blood in July and blood pressure was elevated. Was seen in office 03/18/24 and was started on hydrochlorothiazide .He does check his blood pressure at home. Running around 120's systolic. He actually stopped taking hydrochlorothiazide  since he started checking his blood pressure. Denies chest pain, sob or headaches.  3. Irritable bowel syndrome with diarrhea Denies any recent issues. Has occasional diarrhea  4. Depression, recurrent (HCC)    05/09/2024    3:36 PM 03/18/2024    4:00 PM 05/29/2023    2:57 PM  Depression screen PHQ 2/9  Decreased Interest 0 0 0  Down, Depressed, Hopeless 0 0 0  PHQ - 2 Score 0 0 0  Altered sleeping 0 0 0  Tired, decreased energy 0 0 0  Change in appetite 0 0 0  Feeling bad or failure about yourself  0 0 0  Trouble concentrating 0 0 0  Moving slowly or fidgety/restless 0 0 0  Suicidal thoughts 0 0 0  PHQ-9 Score 0 0 0  Difficult doing work/chores Not difficult at all Not difficult at all Not difficult at all      5. Anxiety    05/09/2024    3:36 PM 05/29/2023    2:57 PM 05/03/2023    3:45 PM 08/30/2022    9:58 AM  GAD 7 : Generalized Anxiety Score  Nervous, Anxious, on Edge 0 0 0 0  Control/stop worrying 0 0 0 0  Worry too much - different things 0 0 0 0  Trouble relaxing 0 0 0 0  Restless 0 0 0 0  Easily annoyed or irritable 0 0 0 0  Afraid - awful might happen 0 0 0 0  Total GAD 7 Score 0 0 0 0  Anxiety Difficulty Not difficult at all Not difficult at all  Not difficult at all      6. Obsessive-compulsive disorder, unspecified type 7. Schizoaffective  disorder, bipolar type [facility]) Patient as been on lots of different meds for his anxiety and bipolar. He is currently on vraylar and stratterra. He sees psych monthly  8. GERD Is on omeprazole  daily and is working well for him  New complaints: Having tonsillectomy in 2 weeks  Allergies  Allergen Reactions   Citalopram  Diarrhea and Other (See Comments)    Suicidal Thoughts   Penicillins Rash   Pollen Extract Cough and Other (See Comments)   Outpatient Encounter Medications as of 05/09/2024  Medication Sig   atomoxetine (STRATTERA) 40 MG capsule Take by mouth.   famotidine  (PEPCID ) 20 MG tablet TAKE 1 TABLET BY MOUTH TWICE A DAY   hydrochlorothiazide  (MICROZIDE ) 12.5 MG capsule Take 1 capsule (12.5 mg total) by mouth daily.   hydrOXYzine  (VISTARIL ) 25 MG capsule Take 1 capsule (25 mg total) by mouth every 8 (eight) hours as needed for itching.   omeprazole  (PRILOSEC) 40 MG capsule Take 1 capsule (40 mg total) by mouth daily.   VRAYLAR 1.5 MG capsule Take 1.5 mg by mouth daily.   [DISCONTINUED] buPROPion  (WELLBUTRIN  SR) 150 MG 12 hr tablet Take 1 tablet (150  mg total) by mouth 2 (two) times daily.   No facility-administered encounter medications on file as of 05/09/2024.    Past Surgical History:  Procedure Laterality Date   WISDOM TOOTH EXTRACTION      Family History  Problem Relation Age of Onset   Colon cancer Neg Hx    Esophageal cancer Neg Hx    Rectal cancer Neg Hx    Stomach cancer Neg Hx       Controlled substance contract: n/a     Review of Systems  Constitutional:  Negative for diaphoresis.  Eyes:  Negative for pain.  Respiratory:  Negative for shortness of breath.   Cardiovascular:  Negative for chest pain, palpitations and leg swelling.  Gastrointestinal:  Negative for abdominal pain.  Endocrine: Negative for polydipsia.  Skin:  Negative for rash.  Neurological:  Negative for dizziness, weakness and headaches.  Hematological:  Does not bruise/bleed  easily.  All other systems reviewed and are negative.      Objective:   Physical Exam Vitals and nursing note reviewed.  Constitutional:      Appearance: Normal appearance. He is well-developed.  HENT:     Head: Normocephalic.     Nose: Nose normal.     Mouth/Throat:     Mouth: Mucous membranes are moist.     Pharynx: Oropharynx is clear.     Comments: Enlarged tonsils Eyes:     Pupils: Pupils are equal, round, and reactive to light.  Neck:     Thyroid : No thyroid  mass or thyromegaly.     Vascular: No carotid bruit or JVD.     Trachea: Phonation normal.  Cardiovascular:     Rate and Rhythm: Normal rate and regular rhythm.  Pulmonary:     Effort: Pulmonary effort is normal. No respiratory distress.     Breath sounds: Normal breath sounds.  Abdominal:     General: Bowel sounds are normal.     Palpations: Abdomen is soft.     Tenderness: There is no abdominal tenderness.  Musculoskeletal:        General: Normal range of motion.     Cervical back: Normal range of motion and neck supple.  Lymphadenopathy:     Cervical: No cervical adenopathy.  Skin:    General: Skin is warm and dry.  Neurological:     Mental Status: He is alert and oriented to person, place, and time.  Psychiatric:        Behavior: Behavior normal.        Thought Content: Thought content normal.        Judgment: Judgment normal.     BP 136/89   Pulse 90   Temp (!) 97.5 F (36.4 C) (Temporal)   Ht 6' 1 (1.854 m)   Wt 236 lb (107 kg)   SpO2 96%   BMI 31.14 kg/m        Assessment & Plan:   Marcus Martinez comes in today with chief complaint of Annual Exam   Diagnosis and orders addressed:  1. Annual physical exam (Primary)  - CBC with Differential/Platelet - CMP14+EGFR - Lipid panel - Thyroid  Panel With TSH - VITAMIN D  25 Hydroxy (Vit-D Deficiency, Fractures)  2. Primary hypertension Continue  to keep check of blood pressure at home  3. Irritable bowel syndrome with  diarrhea Watch diet  4. Depression, recurrent (HCC) Keep follow up with psych  5. Anxiety Stress management  6. Obsessive-compulsive disorder, unspecified type Keep follow up with psych  7. Schizoaffective  disorder, bipolar type [facility]) Keep follow up with psych   Labs pending Health Maintenance reviewed Diet and exercise encouraged  Follow up plan: 6 months   [name], FNP

## 2024-05-09 NOTE — Patient Instructions (Signed)

## 2024-05-12 ENCOUNTER — Ambulatory Visit: Payer: Self-pay | Admitting: Nurse Practitioner

## 2024-05-12 LAB — CBC WITH DIFFERENTIAL/PLATELET
Basophils Absolute: 0.1 x10E3/uL (ref 0.0–0.2)
Basos: 1 %
EOS (ABSOLUTE): 0.2 x10E3/uL (ref 0.0–0.4)
Eos: 3 %
Hematocrit: 49.3 % (ref 37.5–51.0)
Hemoglobin: 15.7 g/dL (ref 13.0–17.7)
Immature Grans (Abs): 0 x10E3/uL (ref 0.0–0.1)
Immature Granulocytes: 0 %
Lymphocytes Absolute: 2.3 x10E3/uL (ref 0.7–3.1)
Lymphs: 29 %
MCH: 29.2 pg (ref 26.6–33.0)
MCHC: 31.8 g/dL (ref 31.5–35.7)
MCV: 92 fL (ref 79–97)
Monocytes Absolute: 0.7 x10E3/uL (ref 0.1–0.9)
Monocytes: 9 %
Neutrophils Absolute: 4.7 x10E3/uL (ref 1.4–7.0)
Neutrophils: 58 %
Platelets: 298 x10E3/uL (ref 150–450)
RBC: 5.37 x10E6/uL (ref 4.14–5.80)
RDW: 12.9 % (ref 11.6–15.4)
WBC: 8 x10E3/uL (ref 3.4–10.8)

## 2024-05-12 LAB — LIPID PANEL
Chol/HDL Ratio: 7.9 ratio — ABNORMAL HIGH (ref 0.0–5.0)
Cholesterol, Total: 220 mg/dL — ABNORMAL HIGH (ref 100–199)
HDL: 28 mg/dL — ABNORMAL LOW (ref >39)
LDL Chol Calc (NIH): 130 mg/dL — ABNORMAL HIGH (ref 0–99)
Triglycerides: 346 mg/dL — ABNORMAL HIGH (ref 0–149)
VLDL Cholesterol Cal: 62 mg/dL — ABNORMAL HIGH (ref 5–40)

## 2024-05-12 LAB — CMP14+EGFR
ALT: 25 IU/L (ref 0–44)
AST: 21 IU/L (ref 0–40)
Albumin: 4.3 g/dL (ref 4.1–5.1)
Alkaline Phosphatase: 82 IU/L (ref 44–121)
BUN/Creatinine Ratio: 10 (ref 9–20)
BUN: 10 mg/dL (ref 6–20)
Bilirubin Total: 0.3 mg/dL (ref 0.0–1.2)
CO2: 20 mmol/L (ref 20–29)
Calcium: 9.7 mg/dL (ref 8.7–10.2)
Chloride: 104 mmol/L (ref 96–106)
Creatinine, Ser: 0.98 mg/dL (ref 0.76–1.27)
Globulin, Total: 2.7 g/dL (ref 1.5–4.5)
Glucose: 85 mg/dL (ref 70–99)
Potassium: 4.6 mmol/L (ref 3.5–5.2)
Sodium: 139 mmol/L (ref 134–144)
Total Protein: 7 g/dL (ref 6.0–8.5)
eGFR: 104 mL/min/1.73 (ref >59)

## 2024-05-12 LAB — THYROID PANEL WITH TSH
Free Thyroxine Index: 2.3 (ref 1.2–4.9)
T3 Uptake Ratio: 26 % (ref 24–39)
T4, Total: 9 ug/dL (ref 4.5–12.0)
TSH: 3.37 u[IU]/mL (ref 0.450–4.500)

## 2024-05-12 LAB — VITAMIN D 25 HYDROXY (VIT D DEFICIENCY, FRACTURES): Vit D, 25-Hydroxy: 19.5 ng/mL — ABNORMAL LOW (ref 30.0–100.0)

## 2024-05-21 ENCOUNTER — Encounter: Payer: Self-pay | Admitting: Family Medicine

## 2024-05-21 ENCOUNTER — Ambulatory Visit: Admitting: Family Medicine

## 2024-05-21 VITALS — BP 130/86 | HR 96 | Temp 98.2°F | Ht 73.0 in | Wt 237.0 lb

## 2024-05-21 DIAGNOSIS — I10 Essential (primary) hypertension: Secondary | ICD-10-CM

## 2024-05-21 DIAGNOSIS — F419 Anxiety disorder, unspecified: Secondary | ICD-10-CM

## 2024-05-21 MED ORDER — DILTIAZEM HCL ER 120 MG PO CP24: 120.0000 mg | ORAL_CAPSULE | Freq: Every day | ORAL | 2 refills | Status: DC

## 2024-05-21 NOTE — Progress Notes (Signed)
 Subjective:  Patient ID: Marcus Martinez, male    DOB: [birth year]  Age: 33 y.o. MRN: [id number]  CC: Hypertension   HPI  Discussed the use of AI scribe software for clinical note transcription with the patient, who gave verbal consent to proceed.  History of Present Illness Marcus Martinez is a 33 year old male with hypertension who presents for follow-up of his blood pressure management.  In July, his blood pressure was noted to be high, leading to the initiation of hydrochlorothiazide . He was uncomfortable with its effect on his sodium levels and subsequently discontinued it.  He experienced increased urinary frequency, needing to urinate four to five times a day, which was inconvenient for his work schedule. As a result, he stopped taking the medication and began monitoring his own blood pressure.  His self-monitored blood pressure readings appeared mostly within normal limits, though some were on the higher end of normal. However, there is uncertainty about the accuracy of his home blood pressure monitor.  His blood pressure was noted to be elevated on two occasions when he attempted to donate blood, and it was also somewhat elevated during today's visit. He is also concerned that his pulse is running somewhat high. Numbers not shared.          05/21/2024    4:08 PM 05/09/2024    3:36 PM 03/18/2024    4:00 PM  Depression screen PHQ 2/9  Decreased Interest 0 0 0  Down, Depressed, Hopeless 0 0 0  PHQ - 2 Score 0 0 0  Altered sleeping  0 0  Tired, decreased energy  0 0  Change in appetite  0 0  Feeling bad or failure about yourself   0 0  Trouble concentrating  0 0  Moving slowly or fidgety/restless  0 0  Suicidal thoughts  0 0  PHQ-9 Score  0 0  Difficult doing work/chores Not difficult at all Not difficult at all Not difficult at all    History Marcus Martinez has a past medical history of ADHD (attention deficit hyperactivity disorder), GAD (generalized anxiety disorder), and  Major depressive disorder.   He has a past surgical history that includes Wisdom tooth extraction.   His family history is not on file.He reports that he has been smoking cigarettes. He has never used smokeless tobacco. He reports current alcohol use. He reports that he does not currently use drugs after having used the following drugs: Marijuana.    ROS Review of Systems  Constitutional:  Negative for fever.  Respiratory:  Negative for shortness of breath.   Cardiovascular:  Negative for chest pain.  Genitourinary:  Positive for frequency (when taking HCTZ).  Musculoskeletal:  Negative for arthralgias.  Skin:  Negative for rash.    Objective:  BP 130/86   Pulse 96   Temp 98.2 F (36.8 C)   Ht 6' 1 (1.854 m)   Wt 237 lb (107.5 kg)   SpO2 96%   BMI 31.27 kg/m   BP Readings from Last 3 Encounters:  05/21/24 130/86  05/09/24 136/89  03/18/24 (!) 138/98    Wt Readings from Last 3 Encounters:  05/21/24 237 lb (107.5 kg)  05/09/24 236 lb (107 kg)  03/18/24 238 lb (108 kg)     Physical Exam Vitals reviewed.  Constitutional:      Appearance: He is well-developed.  HENT:     Head: Normocephalic and atraumatic.     Right Ear: External ear normal.     Left Ear: External  ear normal.     Mouth/Throat:     Pharynx: No oropharyngeal exudate or posterior oropharyngeal erythema.  Eyes:     Pupils: Pupils are equal, round, and reactive to light.  Cardiovascular:     Rate and Rhythm: Normal rate and regular rhythm.     Heart sounds: No murmur heard. Pulmonary:     Effort: No respiratory distress.     Breath sounds: Normal breath sounds.  Musculoskeletal:     Cervical back: Normal range of motion and neck supple.  Neurological:     Mental Status: He is alert and oriented to person, place, and time.  Psychiatric:        Mood and Affect: Mood is anxious.        Thought Content: Thought content normal.        Cognition and Memory: Cognition normal.        Judgment:  Judgment is impulsive.      Assessment & Plan:  Primary hypertension  Anxiety  Other orders -     dilTIAZem  HCl ER; Take 1 capsule (120 mg total) by mouth daily.  Dispense: 30 capsule; Refill: 2    Assessment and Plan Assessment & Plan Essential hypertension   He has essential hypertension with previous high readings in July. Hydrochlorothiazide  was started but stopped due to concerns about sodium levels and increased urination. Self-monitoring showed mostly normal blood pressure readings, though some were on the higher end of normal. Blood pressure was elevated during two blood donation attempts and somewhat elevated in the clinic. There is uncertainty about the accuracy of his home blood pressure monitor. Start a new antihypertensive medication. Have the nurse verify the accuracy of the home blood pressure monitor against the clinic monitor. Schedule a follow-up appointment with Marcus Martinez for mid-October to monitor his response to the new medication and ensure blood pressure control.       Follow-up: Return in about 1 month (around 06/20/2024), or With Marcus Martinez, His PCP, for hypertension, Anxiety.  Marcus Martinez, M.D.

## 2024-05-23 ENCOUNTER — Other Ambulatory Visit: Payer: Self-pay | Admitting: Otolaryngology

## 2024-06-04 ENCOUNTER — Encounter (HOSPITAL_COMMUNITY): Payer: Self-pay | Admitting: Otolaryngology

## 2024-06-04 ENCOUNTER — Other Ambulatory Visit: Payer: Self-pay

## 2024-06-04 NOTE — Progress Notes (Signed)
 SDW call  Patient was given pre-op instructions over the phone. Patient verbalized understanding of instructions provided.     PCP - [name], FNP Cardiologist -  Pulmonary:    PPM/ICD - denies Device Orders - na Rep Notified - na   Chest x-ray - na EKG -  DOS, 06/05/2024 Stress Test - ECHO -  Cardiac Cath -   Sleep Study/sleep apnea/CPAP: denies  Non-diabetic  Blood Thinner Instructions: denies Aspirin Instructions:denies   ERAS Protcol - Clears until 1045  Anesthesia review: No   Patient denies shortness of breath, fever, cough and chest pain over the phone call  Your procedure is scheduled on Thursday June 05, 2024  Report to [facility] Main Entrance A at  1115  A.M., then check in with the Admitting office.  Call this number if you have problems the morning of surgery:  [phone]   If you have any questions prior to your surgery date call [phone]: Open Monday-Friday 8am-4pm If you experience any cold or flu symptoms such as cough, fever, chills, shortness of breath, etc. between now and your scheduled surgery, please notify us  at the above number    Remember:  Do not eat after midnight the night before your surgery  You may drink clear liquids until  1045   the morning of your surgery.   Clear liquids allowed are: Water, Non-Citrus Juices (without pulp), Carbonated Beverages, Clear Tea, Black Coffee ONLY (NO MILK, CREAM OR POWDERED CREAMER of any kind), and Gatorade   Take these medicines the morning of surgery with A SIP OF WATER:  Diltiazem , bactrim , vraylar  As needed: Pepcid   As of today, STOP taking any Aspirin (unless otherwise instructed by your surgeon) Aleve, Naproxen, Ibuprofen, Motrin, Advil, Goody's, BC's, all herbal medications, fish oil, and all vitamins.

## 2024-06-05 ENCOUNTER — Ambulatory Visit (HOSPITAL_COMMUNITY)

## 2024-06-05 ENCOUNTER — Observation Stay (HOSPITAL_COMMUNITY)
Admission: RE | Admit: 2024-06-05 | Discharge: 2024-06-06 | Disposition: A | Discharge status: Home / Self Care | Payer: Self-pay | Attending: Otolaryngology | Admitting: Otolaryngology

## 2024-06-05 ENCOUNTER — Encounter (HOSPITAL_COMMUNITY): Payer: Self-pay | Admitting: Otolaryngology

## 2024-06-05 ENCOUNTER — Encounter (HOSPITAL_COMMUNITY): Admission: RE | Disposition: A | Payer: Self-pay | Source: Home / Self Care | Attending: Otolaryngology

## 2024-06-05 ENCOUNTER — Other Ambulatory Visit: Payer: Self-pay

## 2024-06-05 DIAGNOSIS — J351 Hypertrophy of tonsils: Secondary | ICD-10-CM | POA: Diagnosis not present

## 2024-06-05 DIAGNOSIS — Z87891 Personal history of nicotine dependence: Secondary | ICD-10-CM | POA: Diagnosis not present

## 2024-06-05 DIAGNOSIS — D1039 Benign neoplasm of other parts of mouth: Secondary | ICD-10-CM | POA: Diagnosis not present

## 2024-06-05 DIAGNOSIS — I10 Essential (primary) hypertension: Secondary | ICD-10-CM | POA: Diagnosis not present

## 2024-06-05 HISTORY — DX: Essential (primary) hypertension: I10

## 2024-06-05 HISTORY — PX: TONSILLECTOMY: SHX5217

## 2024-06-05 SURGERY — TONSILLECTOMY
Anesthesia: General | Laterality: Bilateral

## 2024-06-05 MED ORDER — FENTANYL CITRATE (PF) 250 MCG/5ML IJ SOLN
INTRAMUSCULAR | Status: AC
  Filled 2024-06-05: qty 5

## 2024-06-05 MED ORDER — MIDAZOLAM HCL 2 MG/2ML IJ SOLN
INTRAMUSCULAR | Status: DC | PRN
  Administered 2024-06-05: 2 mg via INTRAVENOUS

## 2024-06-05 MED ORDER — OXYCODONE HCL 5 MG PO TABS: 5.0000 mg | ORAL_TABLET | Freq: Once | ORAL | Status: DC | PRN

## 2024-06-05 MED ORDER — PROPOFOL 10 MG/ML IV BOLUS
INTRAVENOUS | Status: DC | PRN
  Administered 2024-06-05: 200 mg via INTRAVENOUS

## 2024-06-05 MED ORDER — OXYCODONE HCL 5 MG/5ML PO SOLN: 5.0000 mg | Freq: Once | ORAL | Status: DC | PRN

## 2024-06-05 MED ORDER — CHLORHEXIDINE GLUCONATE 0.12 % MT SOLN
15.0000 mL | Freq: Once | OROMUCOSAL | Status: AC
  Administered 2024-06-05: 15 mL via OROMUCOSAL
  Filled 2024-06-05: qty 15

## 2024-06-05 MED ORDER — ACETAMINOPHEN 10 MG/ML IV SOLN
INTRAVENOUS | Status: DC | PRN
  Administered 2024-06-05: 1000 mg via INTRAVENOUS

## 2024-06-05 MED ORDER — MIDAZOLAM HCL 2 MG/2ML IJ SOLN: 0.5000 mg | Freq: Once | INTRAMUSCULAR | Status: DC | PRN

## 2024-06-05 MED ORDER — ORAL CARE MOUTH RINSE: 15.0000 mL | Freq: Once | OROMUCOSAL | Status: AC

## 2024-06-05 MED ORDER — SUGAMMADEX SODIUM 200 MG/2ML IV SOLN
INTRAVENOUS | Status: DC | PRN
  Administered 2024-06-05: 200 mg via INTRAVENOUS

## 2024-06-05 MED ORDER — ROCURONIUM BROMIDE 10 MG/ML (PF) SYRINGE
PREFILLED_SYRINGE | INTRAVENOUS | Status: AC
  Filled 2024-06-05: qty 10

## 2024-06-05 MED ORDER — MEPERIDINE HCL 25 MG/ML IJ SOLN
6.2500 mg | INTRAMUSCULAR | Status: DC | PRN
  Filled 2024-06-05: qty 1

## 2024-06-05 MED ORDER — ORAL CARE MOUTH RINSE: 15.0000 mL | OROMUCOSAL | Status: DC | PRN

## 2024-06-05 MED ORDER — ACETAMINOPHEN 500 MG PO TABS: 1000.0000 mg | ORAL_TABLET | Freq: Once | ORAL | Status: DC

## 2024-06-05 MED ORDER — ONDANSETRON HCL 4 MG/2ML IJ SOLN
4.0000 mg | INTRAMUSCULAR | Status: DC | PRN
Start: 2024-06-05 — End: 2024-06-06

## 2024-06-05 MED ORDER — ROCURONIUM BROMIDE 10 MG/ML (PF) SYRINGE
PREFILLED_SYRINGE | INTRAVENOUS | Status: DC | PRN
  Administered 2024-06-05: 50 mg via INTRAVENOUS

## 2024-06-05 MED ORDER — DEXAMETHASONE SODIUM PHOSPHATE 10 MG/ML IJ SOLN
INTRAMUSCULAR | Status: DC | PRN
  Administered 2024-06-05: 10 mg via INTRAVENOUS

## 2024-06-05 MED ORDER — KCL IN DEXTROSE-NACL 20-5-0.45 MEQ/L-%-% IV SOLN
INTRAVENOUS | Status: DC
Start: 2024-06-05 — End: 2024-06-06
  Filled 2024-06-05 (×2): qty 1000

## 2024-06-05 MED ORDER — FENTANYL CITRATE (PF) 100 MCG/2ML IJ SOLN
INTRAMUSCULAR | Status: AC
  Filled 2024-06-05: qty 2

## 2024-06-05 MED ORDER — SUCCINYLCHOLINE CHLORIDE 200 MG/10ML IV SOSY
PREFILLED_SYRINGE | INTRAVENOUS | Status: DC | PRN
Start: 2024-06-05 — End: 2024-06-05
  Administered 2024-06-05: 180 mg via INTRAVENOUS

## 2024-06-05 MED ORDER — LACTATED RINGERS IV SOLN: INTRAVENOUS | Status: DC

## 2024-06-05 MED ORDER — LIDOCAINE 2% (20 MG/ML) 5 ML SYRINGE
INTRAMUSCULAR | Status: AC
  Filled 2024-06-05: qty 5

## 2024-06-05 MED ORDER — HYDROMORPHONE HCL 1 MG/ML IJ SOLN
0.2500 mg | INTRAMUSCULAR | Status: DC | PRN
  Administered 2024-06-05 (×2): 0.5 mg via INTRAVENOUS

## 2024-06-05 MED ORDER — LIDOCAINE 2% (20 MG/ML) 5 ML SYRINGE
INTRAMUSCULAR | Status: DC | PRN
  Administered 2024-06-05: 100 mg via INTRAVENOUS

## 2024-06-05 MED ORDER — FENTANYL CITRATE (PF) 250 MCG/5ML IJ SOLN
INTRAMUSCULAR | Status: DC | PRN
  Administered 2024-06-05: 150 ug via INTRAVENOUS
  Administered 2024-06-05: 50 ug via INTRAVENOUS

## 2024-06-05 MED ORDER — DILTIAZEM HCL ER COATED BEADS 120 MG PO CP24
120.0000 mg | ORAL_CAPSULE | Freq: Every day | ORAL | Status: DC
  Administered 2024-06-06: 120 mg via ORAL
  Filled 2024-06-05 (×2): qty 1

## 2024-06-05 MED ORDER — ACETAMINOPHEN 10 MG/ML IV SOLN
INTRAVENOUS | Status: AC
  Filled 2024-06-05: qty 100

## 2024-06-05 MED ORDER — MIDAZOLAM HCL 2 MG/2ML IJ SOLN
INTRAMUSCULAR | Status: AC
  Filled 2024-06-05: qty 2

## 2024-06-05 MED ORDER — ONDANSETRON HCL 4 MG PO TABS: 4.0000 mg | ORAL_TABLET | ORAL | Status: DC | PRN

## 2024-06-05 MED ORDER — MORPHINE SULFATE (PF) 2 MG/ML IV SOLN: 2.0000 mg | INTRAVENOUS | Status: DC | PRN

## 2024-06-05 MED ORDER — ONDANSETRON HCL 4 MG/2ML IJ SOLN
INTRAMUSCULAR | Status: DC | PRN
  Administered 2024-06-05: 4 mg via INTRAVENOUS

## 2024-06-05 MED ORDER — HYDROCODONE-ACETAMINOPHEN 7.5-325 MG/15ML PO SOLN
10.0000 mL | ORAL | Status: DC | PRN
  Administered 2024-06-06: 10 mL via ORAL
  Administered 2024-06-06: 15 mL via ORAL
  Filled 2024-06-05 (×3): qty 15

## 2024-06-05 MED ORDER — HYDROMORPHONE HCL 1 MG/ML IJ SOLN
INTRAMUSCULAR | Status: AC
  Filled 2024-06-05: qty 1

## 2024-06-05 SURGICAL SUPPLY — 27 items
BAG COUNTER SPONGE SURGICOUNT (BAG) ×1 IMPLANT
CANISTER SUCTION 3000ML PPV (SUCTIONS) ×1 IMPLANT
CATH ROBINSON RED A/P 10FR (CATHETERS) IMPLANT
CLEANER TIP ELECTROSURG 2X2 (MISCELLANEOUS) ×1 IMPLANT
COAGULATOR SUCT SWTCH 10FR 6 (ELECTROSURGICAL) ×1 IMPLANT
ELECT COATED BLADE 2.86 ST (ELECTRODE) ×1 IMPLANT
ELECTRODE REM PT RETRN 9FT PED (ELECTROSURGICAL) IMPLANT
ELECTRODE REM PT RTRN 9FT ADLT (ELECTROSURGICAL) IMPLANT
GAUZE 4X4 16PLY ~~LOC~~+RFID DBL (SPONGE) ×1 IMPLANT
GLOVE BIO SURGEON STRL SZ7.5 (GLOVE) ×1 IMPLANT
GOWN STRL REUS W/ TWL LRG LVL3 (GOWN DISPOSABLE) ×2 IMPLANT
KIT BASIN OR (CUSTOM PROCEDURE TRAY) ×1 IMPLANT
KIT TURNOVER KIT B (KITS) ×1 IMPLANT
PACK SRG BSC III STRL LF ECLPS (CUSTOM PROCEDURE TRAY) ×1 IMPLANT
PAD ARMBOARD POSITIONER FOAM (MISCELLANEOUS) IMPLANT
PENCIL SMOKE EVACUATOR (MISCELLANEOUS) ×1 IMPLANT
POSITIONER HEAD DONUT 9IN (MISCELLANEOUS) ×1 IMPLANT
SOLN 0.9% NACL 1000 ML (IV SOLUTION) ×1 IMPLANT
SOLN 0.9% NACL POUR BTL 1000ML (IV SOLUTION) ×1 IMPLANT
SPECIMEN JAR SMALL (MISCELLANEOUS) IMPLANT
SPONGE TONSIL 1.25 RF SGL STRG (GAUZE/BANDAGES/DRESSINGS) ×1 IMPLANT
SYR BULB EAR ULCER 3OZ GRN STR (SYRINGE) ×1 IMPLANT
TOWEL GREEN STERILE FF (TOWEL DISPOSABLE) ×1 IMPLANT
TUBE CONNECTING 12X1/4 (SUCTIONS) ×1 IMPLANT
TUBE SALEM SUMP 16F (TUBING) ×1 IMPLANT
TUBE SUCT ARGYLE STRL (TUBING) IMPLANT
YANKAUER SUCT BULB TIP NO VENT (SUCTIONS) ×1 IMPLANT

## 2024-06-05 NOTE — Anesthesia Procedure Notes (Signed)
 Procedure Name: Intubation Date/Time: 06/05/2024 1:35 PM  Performed by: Wei Newbrough C, CRNAPre-anesthesia Checklist: Patient identified, Emergency Drugs available, Suction available and Patient being monitored Patient Re-evaluated:Patient Re-evaluated prior to induction Oxygen Delivery Method: Circle system utilized Preoxygenation: Pre-oxygenation with 100% oxygen Induction Type: IV induction Ventilation: Mask ventilation without difficulty Laryngoscope Size: Mac and 4 Grade View: Grade I Tube type: Oral Tube size: 7.5 mm Number of attempts: 1 Airway Equipment and Method: Stylet and Oral airway Placement Confirmation: ETT inserted through vocal cords under direct vision, positive ETCO2 and breath sounds checked- equal and bilateral Secured at: 23 cm Tube secured with: Tape Dental Injury: Teeth and Oropharynx as per pre-operative assessment

## 2024-06-05 NOTE — H&P (Signed)
 Marcus Martinez is an 33 y.o. male.   Chief Complaint: Tonsillar hypertrophy HPI: 33 year old male with symptoms suggesting sleep apnea found to have very large tonsils.  He presents for surgical management.  Past Medical History:  Diagnosis Date   ADHD (attention deficit hyperactivity disorder)    GAD (generalized anxiety disorder)    Hypertension    Major depressive disorder     Past Surgical History:  Procedure Laterality Date   COLONOSCOPY     WISDOM TOOTH EXTRACTION      Family History  Problem Relation Age of Onset   Colon cancer Neg Hx    Esophageal cancer Neg Hx    Rectal cancer Neg Hx    Stomach cancer Neg Hx    Social History:  reports that he quit smoking about 5 months ago. His smoking use included cigarettes. He has never used smokeless tobacco. He reports that he does not currently use alcohol. He reports that he does not currently use drugs after having used the following drugs: Marijuana.  Allergies:  Allergies  Allergen Reactions   Citalopram  Diarrhea and Other (See Comments)    Suicidal Thoughts   Penicillins Rash   Pollen Extract Cough and Other (See Comments)    Medications Prior to Admission  Medication Sig Dispense Refill   atomoxetine (STRATTERA) 40 MG capsule Take 40 mg by mouth daily.     diltiazem  (DILACOR XR ) 120 MG 24 hr capsule Take 1 capsule (120 mg total) by mouth daily. 30 capsule 2   famotidine  (PEPCID ) 20 MG tablet TAKE 1 TABLET BY MOUTH TWICE A DAY (Patient taking differently: Take 20 mg by mouth daily as needed for heartburn or indigestion.) 180 tablet 1   Multiple Vitamins-Minerals (MULTIVITAMIN GUMMIES ADULT) CHEW Chew 1 each by mouth daily.     sulfamethoxazole -trimethoprim  (BACTRIM  DS) 800-160 MG tablet Take 1 tablet by mouth daily.     VRAYLAR 1.5 MG capsule Take 1.5 mg by mouth daily.      No results found for this or any previous visit (from the past 48 hours). No results found.  Review of Systems  All other systems  reviewed and are negative.   Blood pressure (!) 132/93, pulse 93, temperature 97.8 F (36.6 C), temperature source Oral, resp. rate 18, height 6' (1.829 m), weight 108.4 kg, SpO2 97%. Physical Exam Constitutional:      Appearance: Normal appearance. He is normal weight.  HENT:     Head: Normocephalic and atraumatic.     Right Ear: External ear normal.     Left Ear: External ear normal.     Nose: Nose normal.     Mouth/Throat:     Mouth: Mucous membranes are moist.     Pharynx: Oropharynx is clear.  Eyes:     Extraocular Movements: Extraocular movements intact.     Pupils: Pupils are equal, round, and reactive to light.  Cardiovascular:     Rate and Rhythm: Normal rate.  Pulmonary:     Effort: Pulmonary effort is normal.  Skin:    General: Skin is warm and dry.  Neurological:     General: No focal deficit present.     Mental Status: He is alert and oriented to person, place, and time.  Psychiatric:        Mood and Affect: Mood normal.        Behavior: Behavior normal.        Thought Content: Thought content normal.        Judgment:  Judgment normal.      Assessment/Plan Tonsillar hypertrophy  To OR for tonsillectomy.  Vaughan Ricker, MD 06/05/2024, 1:11 PM

## 2024-06-05 NOTE — Plan of Care (Signed)

## 2024-06-05 NOTE — Op Note (Signed)
 Preop diagnosis: Tonsillar hypertrophy Postop diagnosis: same Procedure: Tonsillectomy Surgeon: Carlie Anesth: General Compl: None Findings: Tonsils 4+.  Uvula with papilloma on tip, removed and sent for pathology. Description:  After discussing risks, benefits, and alternatives, the patient was brought to the operative suite and placed on the operative table in the supine position.  Anesthesia was induced and the patient was intubated by the anesthesia team without difficulty.  The bed was turned 90 degrees from anesthesia and the eyes were taped closed.  The patient was given IV Decadron.  A head wrap was placed around the patient's head and the oropharynx was exposed with a Crow-Davis retractor that was placed in suspension on the Mayo stand.  The uvula was found to have a papilloma on the tip that was removed using electrocautery and sent for pathology.  The right tonsil was grasped with a curved Allis and retracted medially while a curvilinear incision was made with the Bovie electrocautery.  Dissection continued in the subcapsular plane until the tonsil was removed.  The same procedure was then performed on the left side.  Tonsils were not sent for pathology.  Bleeding was controlled using packs and suction cautery.  There was a fair amount of blood loss, estimated as 350 cc.  After this was completed, the red rubber catheter was removed and the mouth and nose were copiously irrigated with saline.  A flexible suction catheter was passed down the esophagus to suction out the stomach and esophagus.  The Crow-Davis retractor was taken out of suspension and removed from the patient's mouth.  He was then turned back to anesthesia for wake-up and was extubated and moved to the recovery room in stable condition.

## 2024-06-05 NOTE — Transfer of Care (Signed)
 Immediate Anesthesia Transfer of Care Note  Patient: Marcus Martinez  Procedure(s) Performed: TONSILLECTOMY (Bilateral)  Patient Location: PACU  Anesthesia Type:General  Level of Consciousness: awake, alert , and oriented  Airway & Oxygen Therapy: Patient Spontanous Breathing and Patient connected to nasal cannula oxygen  Post-op Assessment: Report given to RN and Post -op Vital signs reviewed and stable  Post vital signs: Reviewed and stable  Last Vitals:  Vitals Value Taken Time  BP 132/99 06/05/24 14:34  Temp    Pulse 94 06/05/24 14:37  Resp 13 06/05/24 14:37  SpO2 95 % 06/05/24 14:37  Vitals shown include unfiled device data.  Last Pain:  Vitals:   06/05/24 1141  TempSrc:   PainSc: 0-No pain      Patients Stated Pain Goal: 0 (06/05/24 1141)  Complications: No notable events documented.

## 2024-06-05 NOTE — Plan of Care (Signed)

## 2024-06-05 NOTE — Anesthesia Postprocedure Evaluation (Signed)
 Anesthesia Post Note  Patient: Marcus Martinez  Procedure(s) Performed: TONSILLECTOMY (Bilateral)     Patient location during evaluation: PACU Anesthesia Type: General Level of consciousness: awake and alert, patient cooperative and oriented Pain management: pain level controlled Vital Signs Assessment: post-procedure vital signs reviewed and stable Respiratory status: spontaneous breathing, nonlabored ventilation and respiratory function stable Cardiovascular status: blood pressure returned to baseline and stable Postop Assessment: no apparent nausea or vomiting and adequate PO intake Anesthetic complications: no   No notable events documented.  Last Vitals:  Vitals:   06/05/24 1600 06/05/24 1615  BP: (!) 134/91 (!) 134/92  Pulse: 88 89  Resp: 18 14  Temp:    SpO2: 93% 94%    Last Pain:  Vitals:   06/05/24 1615  TempSrc:   PainSc: 3                  Kashauna Celmer,E. Salik Grewell

## 2024-06-05 NOTE — Anesthesia Preprocedure Evaluation (Addendum)
 Anesthesia Evaluation  Patient identified by MRN, date of birth, ID band Patient awake    Reviewed: Allergy & Precautions, NPO status , Patient's Chart, lab work & pertinent test results  History of Anesthesia Complications Negative for: history of anesthetic complications  Airway Mallampati: I  TM Distance: >3 FB Neck ROM: Full    Dental  (+) Dental Advisory Given   Pulmonary former smoker   breath sounds clear to auscultation       Cardiovascular hypertension, Pt. on medications (-) angina  Rhythm:Regular Rate:Normal     Neuro/Psych   Anxiety Depression Bipolar Disorder      GI/Hepatic Neg liver ROS,GERD  Poorly Controlled,,  Endo/Other  BMI 32  Renal/GU negative Renal ROS     Musculoskeletal   Abdominal   Peds  Hematology BMI 32.4   Anesthesia Other Findings   Reproductive/Obstetrics                              Anesthesia Physical Anesthesia Plan  ASA: 3  Anesthesia Plan: General   Post-op Pain Management: Tylenol  PO (pre-op)*   Induction: Intravenous  PONV Risk Score and Plan: 2 and Ondansetron and Dexamethasone  Airway Management Planned: Oral ETT  Additional Equipment: None  Intra-op Plan:   Post-operative Plan: Extubation in OR  Informed Consent: I have reviewed the patients History and Physical, chart, labs and discussed the procedure including the risks, benefits and alternatives for the proposed anesthesia with the patient or authorized representative who has indicated his/her understanding and acceptance.     Dental advisory given  Plan Discussed with: CRNA and Surgeon  Anesthesia Plan Comments:          Anesthesia Quick Evaluation

## 2024-06-06 ENCOUNTER — Encounter (HOSPITAL_COMMUNITY): Payer: Self-pay | Admitting: Otolaryngology

## 2024-06-06 DIAGNOSIS — J351 Hypertrophy of tonsils: Secondary | ICD-10-CM | POA: Diagnosis not present

## 2024-06-06 LAB — SURGICAL PATHOLOGY

## 2024-06-06 MED ORDER — HYDROCODONE-ACETAMINOPHEN 7.5-325 MG/15ML PO SOLN: 10.0000 mL | ORAL | 0 refills | Status: DC | PRN

## 2024-06-06 NOTE — Progress Notes (Signed)
 Discharge instructions reviewed with pt.  Copy of instructions given to pt. Pt informed his script for pain medication was sent to his pharmacy for pick up. Pt able to verbalize and teach back instructions. Pt needs a dose of pain medication before discharging and going to the discharge lounge. Pt's ride will be coming to pick him up.  Pt will be d/c'd via wheelchair with belongings and will be escorted by staff to the discharge lounge.   Nyeemah Jennette,RN SWOT

## 2024-06-06 NOTE — Discharge Summary (Signed)
 Physician Discharge Summary  Patient ID: Marcus Martinez MRN: [id number] DOB/AGE: [birth year] 33 y.o.  Admit date: 06/05/2024 Discharge date: 06/06/2024  Admission Diagnoses: Tonsillar hypertrophy  Discharge Diagnoses:  Principal Problem:   Tonsillar hypertrophy   Discharged Condition: good  Hospital Course: 33 year old male with obstructive breathing related to enlarged tonsils.  He presented for surgical management.  See operative note.  He was observed overnight and did well.  He is felt stable for discharge on POD 1.  Consults: None  Significant Diagnostic Studies: None  Treatments: surgery: Tonsillectomy  Discharge Exam: Blood pressure 134/82, pulse (!) 106, temperature 98.1 F (36.7 C), temperature source Oral, resp. rate 18, height 6' (1.829 m), weight 108.4 kg, SpO2 98%. General appearance: alert, cooperative, and no distress Throat: no bleeding  Disposition: Discharge disposition: 01-Home or Self Care       Discharge Instructions     Diet - low sodium heart healthy   Complete by: As directed    Discharge instructions   Complete by: As directed    Drink plenty of fluids.  Advance diet as able.  Call with significant bleeding, inability to drink, or high spiking fevers.   Increase activity slowly   Complete by: As directed       Allergies as of 06/06/2024       Reactions   Citalopram  Diarrhea, Other (See Comments)   Suicidal Thoughts   Penicillins Rash   Pollen Extract Cough, Other (See Comments)        Medication List     TAKE these medications    atomoxetine 40 MG capsule Commonly known as: STRATTERA Take 40 mg by mouth daily.   diltiazem  120 MG 24 hr capsule Commonly known as: DILACOR XR  Take 1 capsule (120 mg total) by mouth daily.   famotidine  20 MG tablet Commonly known as: PEPCID  TAKE 1 TABLET BY MOUTH TWICE A DAY What changed:  when to take this reasons to take this   HYDROcodone-acetaminophen  7.5-325 mg/15 ml  solution Commonly known as: HYCET Take 10-15 mLs by mouth every 4 (four) hours as needed for moderate pain (pain score 4-6) or severe pain (pain score 7-10).   Multivitamin Gummies Adult Chew Chew 1 each by mouth daily.   sulfamethoxazole -trimethoprim  800-160 MG tablet Commonly known as: BACTRIM  DS Take 1 tablet by mouth daily.   Vraylar 1.5 MG capsule Generic drug: cariprazine Take 1.5 mg by mouth daily.        Follow-up Information     Carlie Clark, MD. Schedule an appointment as soon as possible for a visit in 1 month(s).   Specialty: Otolaryngology Contact information: [address] [place] [address] [phone]                 Signed: Clark Carlie 06/06/2024, 7:17 AM

## 2024-06-19 DIAGNOSIS — B9689 Other specified bacterial agents as the cause of diseases classified elsewhere: Secondary | ICD-10-CM | POA: Diagnosis not present

## 2024-06-19 DIAGNOSIS — L02229 Furuncle of trunk, unspecified: Secondary | ICD-10-CM | POA: Diagnosis not present

## 2024-06-24 ENCOUNTER — Encounter: Payer: Self-pay | Admitting: Nurse Practitioner

## 2024-06-24 ENCOUNTER — Ambulatory Visit: Admitting: Nurse Practitioner

## 2024-06-24 VITALS — BP 124/84 | HR 89 | Temp 98.2°F | Ht 72.0 in | Wt 234.0 lb

## 2024-06-24 DIAGNOSIS — F339 Major depressive disorder, recurrent, unspecified: Secondary | ICD-10-CM

## 2024-06-24 DIAGNOSIS — I10 Essential (primary) hypertension: Secondary | ICD-10-CM

## 2024-06-24 DIAGNOSIS — Z23 Encounter for immunization: Secondary | ICD-10-CM | POA: Diagnosis not present

## 2024-06-24 DIAGNOSIS — F429 Obsessive-compulsive disorder, unspecified: Secondary | ICD-10-CM | POA: Diagnosis not present

## 2024-06-24 DIAGNOSIS — K580 Irritable bowel syndrome with diarrhea: Secondary | ICD-10-CM | POA: Diagnosis not present

## 2024-06-24 NOTE — Addendum Note (Signed)
 Addended by: GLADIS MUSTARD on: 06/24/2024 03:39 PM   Modules accepted: Level of Service

## 2024-06-24 NOTE — Patient Instructions (Signed)

## 2024-06-24 NOTE — Progress Notes (Signed)
 Subjective:    Patient ID: Marcus Martinez, male    DOB: May 02, 1991, 33 y.o.   MRN: [id number]   Chief Complaint: Follow-up    HPI:  Marcus Martinez is a 33 y.o. who identifies as a male who was assigned male at birth.   Comes in today for follow up of the following chronic medical issues:  1. Primary hypertension No c/o chest , sob or headache. Doe snot check blood pressure at home anymore. BP Readings from Last 3 Encounters:  06/24/24 124/84  06/06/24 135/86  05/21/24 130/86     2. Irritable bowel syndrome with diarrhea No issues lately  3. Depression, recurrent Is on vraylar an dis doing well.    06/24/2024    3:26 PM 05/21/2024    4:08 PM 05/09/2024    3:36 PM  Depression screen PHQ 2/9  Decreased Interest 0 0 0  Down, Depressed, Hopeless 0 0 0  PHQ - 2 Score 0 0 0  Altered sleeping   0  Tired, decreased energy   0  Change in appetite   0  Feeling bad or failure about yourself    0  Trouble concentrating   0  Moving slowly or fidgety/restless   0  Suicidal thoughts   0  PHQ-9 Score   0  Difficult doing work/chores  Not difficult at all Not difficult at all      05/21/2024    4:09 PM 05/09/2024    3:36 PM 05/29/2023    2:57 PM 05/03/2023    3:45 PM  GAD 7 : Generalized Anxiety Score  Nervous, Anxious, on Edge 0 0 0 0  Control/stop worrying 0 0 0 0  Worry too much - different things 0 0 0 0  Trouble relaxing 0 0 0 0  Restless 0 0 0 0  Easily annoyed or irritable 0 0 0 0  Afraid - awful might happen 0 0 0 0  Total GAD 7 Score 0 0 0 0  Anxiety Difficulty Not difficult at all Not difficult at all Not difficult at all       4. Obsessive-compulsive disorder, unspecified type Vraylar helps.    New complaints: None today  Allergies  Allergen Reactions   Citalopram  Diarrhea and Other (See Comments)    Suicidal Thoughts   Penicillins Rash   Pollen Extract Cough and Other (See Comments)   Outpatient Encounter Medications as of 06/24/2024   Medication Sig   atomoxetine (STRATTERA) 40 MG capsule Take 40 mg by mouth daily.   diltiazem  (DILACOR XR ) 120 MG 24 hr capsule Take 1 capsule (120 mg total) by mouth daily.   famotidine  (PEPCID ) 20 MG tablet TAKE 1 TABLET BY MOUTH TWICE A DAY (Patient taking differently: Take 20 mg by mouth daily as needed for heartburn or indigestion.)   HYDROcodone-acetaminophen  (HYCET) 7.5-325 mg/15 ml solution Take 10-15 mLs by mouth every 4 (four) hours as needed for moderate pain (pain score 4-6) or severe pain (pain score 7-10).   Multiple Vitamins-Minerals (MULTIVITAMIN GUMMIES ADULT) CHEW Chew 1 each by mouth daily.   sulfamethoxazole -trimethoprim  (BACTRIM  DS) 800-160 MG tablet Take 1 tablet by mouth daily.   VRAYLAR 1.5 MG capsule Take 1.5 mg by mouth daily.   [DISCONTINUED] buPROPion  (WELLBUTRIN  SR) 150 MG 12 hr tablet Take 1 tablet (150 mg total) by mouth 2 (two) times daily.   [DISCONTINUED] hydrOXYzine  (VISTARIL ) 25 MG capsule Take 1 capsule (25 mg total) by mouth every 8 (eight) hours as needed for itching.   [  DISCONTINUED] omeprazole  (PRILOSEC) 40 MG capsule Take 1 capsule (40 mg total) by mouth daily.   No facility-administered encounter medications on file as of 06/24/2024.    Past Surgical History:  Procedure Laterality Date   COLONOSCOPY     TONSILLECTOMY Bilateral 06/05/2024   Procedure: TONSILLECTOMY;  Surgeon: Carlie Clark, MD;  Location: [facility] OR;  Service: ENT;  Laterality: Bilateral;   WISDOM TOOTH EXTRACTION      Family History  Problem Relation Age of Onset   Colon cancer Neg Hx    Esophageal cancer Neg Hx    Rectal cancer Neg Hx    Stomach cancer Neg Hx       Controlled substance contract: n/a     Review of Systems  Constitutional:  Negative for diaphoresis.  Eyes:  Negative for pain.  Respiratory:  Negative for shortness of breath.   Cardiovascular:  Negative for chest pain, palpitations and leg swelling.  Gastrointestinal:  Negative for abdominal pain.   Endocrine: Negative for polydipsia.  Skin:  Negative for rash.  Neurological:  Negative for dizziness, weakness and headaches.  Hematological:  Does not bruise/bleed easily.  All other systems reviewed and are negative.      Objective:   Physical Exam Constitutional:      Appearance: Normal appearance. He is obese.  Cardiovascular:     Rate and Rhythm: Normal rate and regular rhythm.     Heart sounds: Normal heart sounds.  Pulmonary:     Breath sounds: Normal breath sounds.  Skin:    General: Skin is warm.  Neurological:     General: No focal deficit present.     Mental Status: He is alert and oriented to person, place, and time.  Psychiatric:        Mood and Affect: Mood normal.        Behavior: Behavior normal.    BP 124/84   Pulse 89   Temp 98.2 F (36.8 C) (Temporal)   Ht 6' (1.829 m)   Wt 234 lb (106.1 kg)   SpO2 95%   BMI 31.74 kg/m         Assessment & Plan:   Marcus Martinez in today with chief complaint of Follow-up   1. Primary hypertension (Primary) Low sodium diet  2. Irritable bowel syndrome with diarrhea Watch diet to prevent flare up  3. Depression, recurrent Stress management  4. Obsessive-compulsive disorder, unspecified type    The above assessment and management plan was discussed with the patient. The patient verbalized understanding of and has agreed to the management plan. Patient is aware to call the clinic if symptoms persist or worsen. Patient is aware when to return to the clinic for a follow-up visit. Patient educated on when it is appropriate to go to the emergency department.   Marcus Gladis, FNP

## 2024-07-03 DIAGNOSIS — J351 Hypertrophy of tonsils: Secondary | ICD-10-CM | POA: Diagnosis not present

## 2024-08-16 ENCOUNTER — Other Ambulatory Visit: Payer: Self-pay | Admitting: Family Medicine

## 2024-09-18 ENCOUNTER — Ambulatory Visit: Payer: Self-pay | Admitting: Nurse Practitioner

## 2024-09-18 ENCOUNTER — Encounter: Payer: Self-pay | Admitting: Nurse Practitioner

## 2024-09-18 VITALS — BP 142/92 | HR 86 | Temp 98.3°F | Ht 72.0 in | Wt 236.0 lb

## 2024-09-18 DIAGNOSIS — I10 Essential (primary) hypertension: Secondary | ICD-10-CM

## 2024-09-18 DIAGNOSIS — K580 Irritable bowel syndrome with diarrhea: Secondary | ICD-10-CM

## 2024-09-18 DIAGNOSIS — F339 Major depressive disorder, recurrent, unspecified: Secondary | ICD-10-CM

## 2024-09-18 DIAGNOSIS — F419 Anxiety disorder, unspecified: Secondary | ICD-10-CM

## 2024-09-18 DIAGNOSIS — F250 Schizoaffective disorder, bipolar type: Secondary | ICD-10-CM

## 2024-09-18 DIAGNOSIS — K219 Gastro-esophageal reflux disease without esophagitis: Secondary | ICD-10-CM

## 2024-09-18 LAB — LIPID PANEL

## 2024-09-18 MED ORDER — FAMOTIDINE 20 MG PO TABS: 20.0000 mg | ORAL_TABLET | Freq: Two times a day (BID) | ORAL | 1 refills | Status: AC

## 2024-09-18 MED ORDER — HYDROCHLOROTHIAZIDE 25 MG PO TABS: 25.0000 mg | ORAL_TABLET | Freq: Every day | ORAL | 1 refills | Status: AC

## 2024-09-18 MED ORDER — VRAYLAR 1.5 MG PO CAPS: 1.5000 mg | ORAL_CAPSULE | Freq: Every day | ORAL | 1 refills | Status: AC

## 2024-09-18 NOTE — Patient Instructions (Signed)

## 2024-09-18 NOTE — Progress Notes (Signed)
 "  Subjective:    Patient ID: Marcus Martinez, male    DOB: Nov 24, 1990, 34 y.o.   MRN: [id number]   Chief Complaint: medical management of chronic issues     HPI:  Marcus Martinez is a 34 y.o. who identifies as a male who was assigned male at birth.   Comes in today for follow up of the following chronic medical issues:  1. Primary hypertension No c/o chest , sob or headache. Doe snot check blood pressure at home anymore. BP Readings from Last 3 Encounters:  06/24/24 124/84  06/06/24 135/86  05/21/24 130/86     2. Irritable bowel syndrome with diarrhea No issues lately  3. Depression, recurrent Is on vraylar  an dis doing well.     09/18/2024    3:18 PM 06/24/2024    3:26 PM 05/21/2024    4:08 PM  Depression screen PHQ 2/9  Decreased Interest 0 0 0  Down, Depressed, Hopeless 0 0 0  PHQ - 2 Score 0 0 0  Altered sleeping 0    Tired, decreased energy 0    Change in appetite 0    Feeling bad or failure about yourself  0    Trouble concentrating 0    Moving slowly or fidgety/restless 0    Suicidal thoughts 0    PHQ-9 Score 0    Difficult doing work/chores Not difficult at all  Not difficult at all      09/18/2024    3:18 PM 05/21/2024    4:09 PM 05/09/2024    3:36 PM 05/29/2023    2:57 PM  GAD 7 : Generalized Anxiety Score  Nervous, Anxious, on Edge 0 0 0 0  Control/stop worrying 0 0 0 0  Worry too much - different things 0 0 0 0  Trouble relaxing 0 0 0 0  Restless 0 0 0 0  Easily annoyed or irritable 0 0 0 0  Afraid - awful might happen 0 0 0 0  Total GAD 7 Score 0 0 0 0  Anxiety Difficulty Not difficult at all Not difficult at all Not difficult at all Not difficult at all    4. Obsessive-compulsive disorder, unspecified type Vraylar  helps.    New complaints: None today  Allergies  Allergen Reactions   Citalopram  Diarrhea and Other (See Comments)    Suicidal Thoughts   Penicillins Rash   Pollen Extract Cough and Other (See Comments)   Outpatient  Encounter Medications as of 09/18/2024  Medication Sig   atomoxetine (STRATTERA) 40 MG capsule Take 40 mg by mouth daily.   diltiazem  (DILT-XR) 120 MG 24 hr capsule TAKE 1 CAPSULE BY MOUTH EVERY DAY   famotidine  (PEPCID ) 20 MG tablet TAKE 1 TABLET BY MOUTH TWICE A DAY (Patient taking differently: Take 20 mg by mouth daily as needed for heartburn or indigestion.)   HYDROcodone -acetaminophen  (HYCET) 7.5-325 mg/15 ml solution Take 10-15 mLs by mouth every 4 (four) hours as needed for moderate pain (pain score 4-6) or severe pain (pain score 7-10).   Multiple Vitamins-Minerals (MULTIVITAMIN GUMMIES ADULT) CHEW Chew 1 each by mouth daily.   sulfamethoxazole -trimethoprim  (BACTRIM  DS) 800-160 MG tablet Take 1 tablet by mouth daily.   VRAYLAR  1.5 MG capsule Take 1.5 mg by mouth daily.   No facility-administered encounter medications on file as of 09/18/2024.    Past Surgical History:  Procedure Laterality Date   COLONOSCOPY     TONSILLECTOMY Bilateral 06/05/2024   Procedure: TONSILLECTOMY;  Surgeon: Carlie Clark, MD;  Location: [facility]  OR;  Service: ENT;  Laterality: Bilateral;   WISDOM TOOTH EXTRACTION      Family History  Problem Relation Age of Onset   Colon cancer Neg Hx    Esophageal cancer Neg Hx    Rectal cancer Neg Hx    Stomach cancer Neg Hx       Controlled substance contract: n/a     Review of Systems  Constitutional:  Negative for diaphoresis.  Eyes:  Negative for pain.  Respiratory:  Negative for shortness of breath.   Cardiovascular:  Negative for chest pain, palpitations and leg swelling.  Gastrointestinal:  Negative for abdominal pain.  Endocrine: Negative for polydipsia.  Skin:  Negative for rash.  Neurological:  Negative for dizziness, weakness and headaches.  Hematological:  Does not bruise/bleed easily.  All other systems reviewed and are negative.      Objective:   Physical Exam Constitutional:      Appearance: Normal appearance. He is obese.   Cardiovascular:     Rate and Rhythm: Normal rate and regular rhythm.     Heart sounds: Normal heart sounds.  Pulmonary:     Breath sounds: Normal breath sounds.  Skin:    General: Skin is warm.  Neurological:     General: No focal deficit present.     Mental Status: He is alert and oriented to person, place, and time.  Psychiatric:        Mood and Affect: Mood normal.        Behavior: Behavior normal.    BP (!) 143/84   Pulse 86   Temp 98.3 F (36.8 C) (Temporal)   Ht 6' (1.829 m)   Wt 236 lb (107 kg)   SpO2 98%   BMI 32.01 kg/m        Assessment & Plan:  Marcus Martinez comes in today with chief complaint of Medical Management of Chronic Issues   Diagnosis and orders addressed:  1. Primary hypertension (Primary) Dash diet - hydrochlorothiazide  (HYDRODIURIL ) 25 MG tablet; Take 1 tablet (25 mg total) by mouth daily.  Dispense: 90 tablet; Refill: 1 - CBC with Differential/Platelet - CMP14+EGFR - Lipid panel  2. Schizoaffective disorder, bipolar type (HCC) Continue follow up with psych  3. Anxiety Stress management  4. Depression, recurrent - VRAYLAR  1.5 MG capsule; Take 1 capsule (1.5 mg total) by mouth daily.  Dispense: 90 capsule; Refill: 1  5. Irritable bowel syndrome with diarrhea What diet to prevent flare up  6. Gastroesophageal reflux disease, unspecified whether esophagitis present Avoid spicy foods Do not eat 2 hours prior to bedtime  - famotidine  (PEPCID ) 20 MG tablet; Take 1 tablet (20 mg total) by mouth 2 (two) times daily.  Dispense: 180 tablet; Refill: 1   Labs pending Health Maintenance reviewed Diet and exercise encouraged  Follow up plan: 6 months   [name], FNP  "

## 2024-09-19 ENCOUNTER — Ambulatory Visit: Payer: Self-pay | Admitting: Nurse Practitioner

## 2024-09-19 LAB — CBC WITH DIFFERENTIAL/PLATELET
Basophils Absolute: 0.1 x10E3/uL (ref 0.0–0.2)
Basos: 1 %
EOS (ABSOLUTE): 0.1 x10E3/uL (ref 0.0–0.4)
Eos: 2 %
Hematocrit: 47.2 % (ref 37.5–51.0)
Hemoglobin: 14.9 g/dL (ref 13.0–17.7)
Immature Grans (Abs): 0 x10E3/uL (ref 0.0–0.1)
Immature Granulocytes: 0 %
Lymphocytes Absolute: 2.1 x10E3/uL (ref 0.7–3.1)
Lymphs: 25 %
MCH: 27.5 pg (ref 26.6–33.0)
MCHC: 31.6 g/dL (ref 31.5–35.7)
MCV: 87 fL (ref 79–97)
Monocytes Absolute: 0.8 x10E3/uL (ref 0.1–0.9)
Monocytes: 10 %
Neutrophils Absolute: 5.1 x10E3/uL (ref 1.4–7.0)
Neutrophils: 62 %
Platelets: 272 x10E3/uL (ref 150–450)
RBC: 5.42 x10E6/uL (ref 4.14–5.80)
RDW: 13.6 % (ref 11.6–15.4)
WBC: 8.1 x10E3/uL (ref 3.4–10.8)

## 2024-09-19 LAB — CMP14+EGFR
ALT: 21 IU/L (ref 0–44)
AST: 18 IU/L (ref 0–40)
Albumin: 4.5 g/dL (ref 4.1–5.1)
Alkaline Phosphatase: 103 IU/L (ref 47–123)
BUN/Creatinine Ratio: 10 (ref 9–20)
BUN: 9 mg/dL (ref 6–20)
Bilirubin Total: 0.3 mg/dL (ref 0.0–1.2)
CO2: 23 mmol/L (ref 20–29)
Calcium: 9.4 mg/dL (ref 8.7–10.2)
Chloride: 103 mmol/L (ref 96–106)
Creatinine, Ser: 0.88 mg/dL (ref 0.76–1.27)
Globulin, Total: 2.7 g/dL (ref 1.5–4.5)
Glucose: 79 mg/dL (ref 70–99)
Potassium: 4.2 mmol/L (ref 3.5–5.2)
Sodium: 139 mmol/L (ref 134–144)
Total Protein: 7.2 g/dL (ref 6.0–8.5)
eGFR: 116 mL/min/1.73

## 2024-09-19 LAB — LIPID PANEL
Cholesterol, Total: 179 mg/dL (ref 100–199)
HDL: 31 mg/dL — AB
LDL CALC COMMENT:: 5.8 ratio — AB (ref 0.0–5.0)
LDL Chol Calc (NIH): 106 mg/dL — AB (ref 0–99)
Triglycerides: 245 mg/dL — AB (ref 0–149)
VLDL Cholesterol Cal: 42 mg/dL — AB (ref 5–40)

## 2024-12-19 ENCOUNTER — Ambulatory Visit: Payer: Self-pay | Admitting: Nurse Practitioner

## 2025-03-19 ENCOUNTER — Ambulatory Visit: Admitting: Nurse Practitioner
